# Patient Record
Sex: Male | Born: 1940 | Race: White | Hispanic: No | Marital: Married | Smoking: Never smoker
Health system: Southern US, Community
[De-identification: ages and names within clinical notes are randomized; demographics above are authoritative.]

## PROBLEM LIST (undated history)

## (undated) DIAGNOSIS — E7800 Pure hypercholesterolemia, unspecified: Secondary | ICD-10-CM

## (undated) DIAGNOSIS — I2510 Atherosclerotic heart disease of native coronary artery without angina pectoris: Secondary | ICD-10-CM

## (undated) DIAGNOSIS — I714 Abdominal aortic aneurysm, without rupture, unspecified: Secondary | ICD-10-CM

## (undated) DIAGNOSIS — I10 Essential (primary) hypertension: Secondary | ICD-10-CM

## (undated) DIAGNOSIS — M549 Dorsalgia, unspecified: Secondary | ICD-10-CM

## (undated) DIAGNOSIS — I499 Cardiac arrhythmia, unspecified: Secondary | ICD-10-CM

## (undated) DIAGNOSIS — M25512 Pain in left shoulder: Secondary | ICD-10-CM

## (undated) DIAGNOSIS — G8929 Other chronic pain: Secondary | ICD-10-CM

## (undated) DIAGNOSIS — I4891 Unspecified atrial fibrillation: Secondary | ICD-10-CM

## (undated) DIAGNOSIS — E119 Type 2 diabetes mellitus without complications: Secondary | ICD-10-CM

## (undated) DIAGNOSIS — G4730 Sleep apnea, unspecified: Secondary | ICD-10-CM

## (undated) HISTORY — DX: Sleep apnea, unspecified: G47.30

## (undated) HISTORY — DX: Unspecified atrial fibrillation: I48.91

## (undated) HISTORY — DX: Abdominal aortic aneurysm, without rupture: I71.4

## (undated) HISTORY — DX: Pain in left shoulder: M25.512

## (undated) HISTORY — DX: Type 2 diabetes mellitus without complications: E11.9

## (undated) HISTORY — DX: Atherosclerotic heart disease of native coronary artery without angina pectoris: I25.10

## (undated) HISTORY — DX: Abdominal aortic aneurysm, without rupture, unspecified: I71.40

---

## 1981-08-23 HISTORY — PX: CATARACT EXTRACTION: SUR2

## 1981-08-23 HISTORY — PX: EYE SURGERY: SHX253

## 1994-08-23 HISTORY — PX: CORONARY STENT PLACEMENT: SHX1402

## 1994-08-23 HISTORY — PX: CARDIAC CATHETERIZATION: SHX172

## 1995-08-18 HISTORY — PX: CORONARY ANGIOPLASTY WITH STENT PLACEMENT: SHX49

## 1996-06-18 HISTORY — PX: CORONARY ANGIOPLASTY WITH STENT PLACEMENT: SHX49

## 1996-06-18 HISTORY — PX: CARDIAC CATHETERIZATION: SHX172

## 1998-08-23 HISTORY — PX: HERNIA REPAIR: SHX51

## 2000-05-04 ENCOUNTER — Ambulatory Visit (HOSPITAL_COMMUNITY): Admission: RE | Admit: 2000-05-04 | Discharge: 2000-05-04 | Payer: Self-pay | Admitting: Surgery

## 2001-03-15 ENCOUNTER — Encounter: Payer: Self-pay | Admitting: *Deleted

## 2001-03-15 ENCOUNTER — Ambulatory Visit (HOSPITAL_COMMUNITY): Admission: RE | Admit: 2001-03-15 | Discharge: 2001-03-15 | Payer: Self-pay | Admitting: *Deleted

## 2002-05-10 ENCOUNTER — Ambulatory Visit (HOSPITAL_COMMUNITY): Admission: RE | Admit: 2002-05-10 | Discharge: 2002-05-10 | Payer: Self-pay | Admitting: Family Medicine

## 2002-05-10 ENCOUNTER — Encounter: Payer: Self-pay | Admitting: Family Medicine

## 2002-05-15 ENCOUNTER — Ambulatory Visit (HOSPITAL_COMMUNITY): Admission: RE | Admit: 2002-05-15 | Discharge: 2002-05-15 | Payer: Self-pay | Admitting: Family Medicine

## 2002-05-15 ENCOUNTER — Encounter: Payer: Self-pay | Admitting: Family Medicine

## 2002-05-28 ENCOUNTER — Ambulatory Visit (HOSPITAL_COMMUNITY): Admission: RE | Admit: 2002-05-28 | Discharge: 2002-05-28 | Payer: Self-pay | Admitting: Family Medicine

## 2002-05-28 ENCOUNTER — Encounter: Payer: Self-pay | Admitting: Family Medicine

## 2004-06-04 ENCOUNTER — Ambulatory Visit (HOSPITAL_COMMUNITY): Admission: RE | Admit: 2004-06-04 | Discharge: 2004-06-04 | Payer: Self-pay | Admitting: Family Medicine

## 2004-07-10 ENCOUNTER — Ambulatory Visit: Payer: Self-pay | Admitting: Internal Medicine

## 2004-07-10 ENCOUNTER — Ambulatory Visit (HOSPITAL_COMMUNITY): Admission: RE | Admit: 2004-07-10 | Discharge: 2004-07-10 | Payer: Self-pay | Admitting: Internal Medicine

## 2006-08-23 HISTORY — PX: PACEMAKER INSERTION: SHX728

## 2007-04-21 HISTORY — PX: DOPPLER ECHOCARDIOGRAPHY: SHX263

## 2007-04-24 DIAGNOSIS — G4730 Sleep apnea, unspecified: Secondary | ICD-10-CM

## 2007-04-24 HISTORY — DX: Sleep apnea, unspecified: G47.30

## 2007-04-24 HISTORY — PX: INSERT / REPLACE / REMOVE PACEMAKER: SUR710

## 2009-05-07 ENCOUNTER — Emergency Department (HOSPITAL_COMMUNITY): Admission: EM | Admit: 2009-05-07 | Discharge: 2009-05-07 | Payer: Self-pay | Admitting: Emergency Medicine

## 2009-05-20 ENCOUNTER — Ambulatory Visit (HOSPITAL_COMMUNITY): Admission: RE | Admit: 2009-05-20 | Discharge: 2009-05-20 | Payer: Self-pay | Admitting: Family Medicine

## 2009-07-11 ENCOUNTER — Ambulatory Visit: Payer: Self-pay | Admitting: Vascular Surgery

## 2009-11-24 ENCOUNTER — Ambulatory Visit (HOSPITAL_COMMUNITY): Admission: RE | Admit: 2009-11-24 | Discharge: 2009-11-24 | Payer: Self-pay | Admitting: Internal Medicine

## 2010-04-17 HISTORY — PX: NM MYOCAR MULTIPLE W/SPECT: HXRAD626

## 2010-04-17 HISTORY — PX: DOPPLER ECHOCARDIOGRAPHY: SHX263

## 2010-09-04 ENCOUNTER — Ambulatory Visit
Admission: RE | Admit: 2010-09-04 | Discharge: 2010-09-04 | Payer: Self-pay | Source: Home / Self Care | Attending: Vascular Surgery | Admitting: Vascular Surgery

## 2010-11-27 LAB — BASIC METABOLIC PANEL
Calcium: 8.9 mg/dL (ref 8.4–10.5)
Creatinine, Ser: 0.72 mg/dL (ref 0.4–1.5)
GFR calc Af Amer: >60 mL/min (ref >60)
GFR calc non Af Amer: >60 mL/min (ref >60)

## 2010-11-27 LAB — DIFFERENTIAL
Lymphs Abs: 1.3 10*3/uL (ref 0.7–4.0)
Monocytes Relative: 8 % (ref 3–12)
Neutro Abs: 4.2 10*3/uL (ref 1.7–7.7)
Neutrophils Relative %: 69 % (ref 43–77)

## 2010-11-27 LAB — URINALYSIS, ROUTINE W REFLEX MICROSCOPIC
Ketones, ur: NEGATIVE mg/dL
Nitrite: NEGATIVE
Specific Gravity, Urine: 1.01 (ref 1.005–1.030)
Urobilinogen, UA: 0.2 mg/dL (ref 0.0–1.0)
pH: 8 (ref 5.0–8.0)

## 2010-11-27 LAB — PROTIME-INR
INR: 2 — ABNORMAL HIGH (ref 0.00–1.49)
Prothrombin Time: 22.7 seconds — ABNORMAL HIGH (ref 11.6–15.2)

## 2010-11-27 LAB — CBC
Platelets: 134 10*3/uL — ABNORMAL LOW (ref 150–400)
RBC: 4.42 MIL/uL (ref 4.22–5.81)
WBC: 6.1 10*3/uL (ref 4.0–10.5)

## 2010-11-30 ENCOUNTER — Other Ambulatory Visit (INDEPENDENT_AMBULATORY_CARE_PROVIDER_SITE_OTHER): Payer: Self-pay | Admitting: Internal Medicine

## 2010-11-30 DIAGNOSIS — K769 Liver disease, unspecified: Secondary | ICD-10-CM

## 2010-12-07 ENCOUNTER — Encounter (HOSPITAL_COMMUNITY): Payer: Self-pay

## 2010-12-07 ENCOUNTER — Ambulatory Visit (HOSPITAL_COMMUNITY)
Admission: RE | Admit: 2010-12-07 | Discharge: 2010-12-07 | Disposition: A | Discharge status: Home / Self Care | Payer: Medicare Other | Source: Ambulatory Visit | Attending: Internal Medicine | Admitting: Internal Medicine

## 2010-12-07 ENCOUNTER — Other Ambulatory Visit (INDEPENDENT_AMBULATORY_CARE_PROVIDER_SITE_OTHER): Payer: Self-pay | Admitting: Internal Medicine

## 2010-12-07 DIAGNOSIS — K769 Liver disease, unspecified: Secondary | ICD-10-CM

## 2010-12-07 DIAGNOSIS — K7689 Other specified diseases of liver: Secondary | ICD-10-CM | POA: Insufficient documentation

## 2010-12-07 HISTORY — DX: Essential (primary) hypertension: I10

## 2010-12-07 MED ORDER — IOHEXOL 300 MG/ML  SOLN
100.0000 mL | Freq: Once | INTRAMUSCULAR | Status: AC | PRN
  Administered 2010-12-07: 100 mL via INTRAVENOUS

## 2011-01-05 NOTE — Procedures (Signed)
DUPLEX ULTRASOUND OF ABDOMINAL AORTA   INDICATION:  AAA.   HISTORY:  Diabetes:  No.  Cardiac:  Atrial fibrillation, cardiac stent, pacemaker.  Hypertension:  Yes.  Smoking:  No.  Connective Tissue Disorder:  Family History:  No.  Previous Surgery:  No.  Hyperlipidemia:  Yes.   DUPLEX EXAM:         AP (cm)                   TRANSVERSE (cm)  Proximal             2.83 cm                   2.70 cm  Mid                  2.83 cm                   2.65 cm  Distal               Not visualized            Not visualized  Right Iliac          1.11 cm                   1.21 cm  Left Iliac           1.37 cm                   1.31 cm   PREVIOUS:  Date:  AP:  3.2  TRANSVERSE:   IMPRESSION:  1. 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  2. Distal aorta difficult to image due to overlying bowel gas.   ___________________________________________  Quita Skye. Hart Rochester, M.D.   EM/MEDQ  D:  09/04/2010  T:  09/04/2010  Job:  [id number]

## 2011-01-05 NOTE — Consult Note (Signed)
NEW PATIENT CONSULTATION   Johnny Reynolds, Johnny Reynolds  DOB:  06-28-1941                                       07/11/2009  YIRSW#:[id number]   The patient presents today for evaluation of infrarenal abdominal aortic  aneurysm.  He is a very pleasant 70 year old gentleman who in September  had an episode of abdominal discomfort.  He, at the same setting, had  hypotension, checked his blood pressure and it was in the 80s with  several repeats.  He reports that he had a feeling of needing to have a  bowel movement, at the same time had a feeling of having to vomit.  He  did not vomit.  The nausea persisted and he presented to the emergency  room for further evaluation.  He did have some left lower quadrant  discomfort at the same setting but all of his abdominal symptoms were  relatively mild.  He reports these resolved after several hours and has  not had any recurrent episode of this type of pain.  He had had no prior  events like this and has had none since.   REVIEW OF SYSTEMS:  Is noted for atrial fibrillation.  He does not have  any pulmonary symptoms.  He did have a colonoscopy with some possible  diverticulosis.  He has no GU, vascular, neurologic, musculoskeletal,  psychiatric difficulty.  He does have a history of prior coronary  angioplasty and a pacemaker placed in 2008.  His review of systems  otherwise is negative.   SOCIAL HISTORY:  He is married with 2 children.  He is a retired  Art gallery manager.  He does not smoke or drink alcohol.   FAMILY HISTORY:  He does have a history of premature atherosclerotic  disease in his mother, sister and his brother.   PHYSICAL EXAM:  General:  A well-developed, well-nourished white male  appearing stated age of 31.  He is in no acute distress.  HEENT:  His  pupils are equal, round and reactive to light.  Conjunctivae are not  icteric.  Neck:  Shows no JVD, no cervical adenopathy and no bruits are  present.  Lungs:  Are clear  bilaterally with no rales, rhonchi or  wheezes.  Cardiovascular:  Regular rate and rhythm with no murmurs.  Abdomen:  Abdominal exam is soft and nontender.  I do not feel any  masses.  I do not feel an aneurysm.  Musculoskeletal:  No deformities.  No cyanosis or edema.  Neurological:  No focal deficits or paresthesias.  Skin:  Without rashes or ulcers.   I have reviewed the patient's data from his evaluation with Dr. Phillips Odor.  He is to have further evaluation of a liver lesion as well.  His CT scan  at the time of this visit showed a maximal diameter of 3.2 cm infrarenal  abdominal aortic aneurysm.  I discussed this at length with the patient  and his wife present.  I did review his actual films with him as well.  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  I explained the significance of this and this can lead to  increased risk for aneurysm formation.  I explained that with his very  small aneurysm it would be unlikely that he would ever have any  significant difficulty related to this.  I have recommended  that we see  him again in 1 year with repeat ultrasound to rule out any change in his  size of his small aneurysm.  He understands and he does not need to have  any restrictions of his activity.   Larina Earthly, M.D.  Electronically Signed   TFE/MEDQ  D:  07/11/2009  T:  07/11/2009  Job:  [id number]   cc:   Corrie Mckusick, M.D.

## 2011-01-08 NOTE — Op Note (Signed)
[place]  Patient:    Johnny Reynolds, Johnny Reynolds                       MRN: [id number] Proc. Date: 05/04/00 Adm. Date:  [id number] Attending:  Andre Lefort CC:         Dr. Burton Apley, [place]   Operative Report  DATE OF BIRTH:  04/14/1941  PREOPERATIVE DIAGNOSIS:  Medium-sized right inguinal hernia, small left inguinal hernia.  POSTOPERATIVE DIAGNOSIS:  Medium-sized indirect right inguinal hernia with lipoma, small indirect left inguinal hernia with lipoma  OPERATION PERFORMED:  Bilateral laparoscopic inguinal herniorrhaphy with mesh repair.  SURGEON:  Sandria Bales. Ezzard Standing, M.D.  ASSISTANT:  Anselm Pancoast. Zachery Dakins, M.D.  ANESTHESIA:  General endotracheal.  ESTIMATED BLOOD LOSS:  Minimal.  INDICATIONS FOR PROCEDURE:  The patient is a 70 year old white male who has a symptomatic right inguinal hernia.  On my physical exam I think he has the beginnings of a left inguinal hernia.  I talked to him about both laparoscopic and open inguinal hernia repairs.  He wanted to proceed with laparoscopic hernia repair and we will do that at this time.  DESCRIPTION OF PROCEDURE:  The patient was placed in a supine position with both his arms tucked to his side.  He was given a gram of Ancef at the initiation of the procedure.  He had a Foley catheter in place.  His lower abdomen was shaved and prepped with Betadine solution and sterilely draped.  The initial incision was infraumbilical incision down to the rectus abdominis fascia.  I went to the right lateral anterior rectus sheath, divided this, retracted the rectus muscle laterally and then inserted the autosuture or [employer] PBD balloon in the preperitoneal space behind the rectus muscle.  I got reasonably good insufflation with direct vision of the pubic bone, Coopers ligament, inferior epigastric vessels on both sides.  I then removed the balloon and inserted the Hasson trocar and  insufflated the balloon via Hasson with 30 cc of air.  Then under direct visualization, I placed two 5 mm ports at the level of the anterior iliac spines off the midline.  I infiltrated both of these with 0.25% Marcaine.  I then was able to introduce my instruments. I was able to dissect first the right side, identified a reasonably large lipoma with a medium-sized indirect hernia sac which was retracted down to the level of the  anterior iliac spines.  I was able to isolate the cord structures, identify the vas deferens. I then turned to the left side finding a similar lipoma though not quite as large.  I found a more minimal hernia on the left side.  At this point I did a repair using the precut Atrium mesh that I wrapped around the cord on both sides.  I then placed staples to the pubic bone medially to Coopers ligament inferiorly, the transversalis fascia superiorly. I spared any staples lateral to the cord structures and inferior to the iliopubic tract and then I placed identical staples on the opposite side on the right side, again to the pubic bone medially, to the Coopers ligament inferiorly to the transversalis fascia superiorly.  I used 13 staples on the left side, 17 staples on the right side.  At termination, the mesh was lying in good position covering both direct hernias and was well wrapped around the cord without any excess tension.  There was no bleeding.  The trocars  were then removed under direct visualization without bleeding.  I placed a suture in the anterior rectus fascia with 0 Vicryl suture and then closed the skin with a 5-0 Vicryl suture, painted with tincture of benzoin, Steri-Strips and sterilely dressed.  The patient tolerated the procedure well was transported to the recovery room in good condition with plans to be discharged home today and return to see me in seven to 10 days for follow-up and to call for any problems. DD:  05/04/00 TD:  05/05/00 Job:  [id number]

## 2011-01-08 NOTE — Op Note (Signed)
Johnny Reynolds, Johnny Reynolds                ACCOUNT NO.:  [id number]   MEDICAL RECORD NO.:  [id number]          PATIENT TYPE:  AMB   LOCATION:  DAY                           FACILITY:  [facility]   PHYSICIAN:  Lionel December, M.D.    DATE OF BIRTH:  July 20, 1941   DATE OF PROCEDURE:  07/10/2004  DATE OF DISCHARGE:                                 OPERATIVE REPORT   PROCEDURE:  Total colonoscopy with polypectomy.   The patient is a 70 year old Caucasian male who has had a few episodes of  hematochezia without any change in his bowel habits.  Family history is  negative for colorectal carcinoma.  He is undergoing diagnostic colonoscopy.  The procedure risks were reviewed with the patient and informed consent was  obtained.   PREMEDICATION:  Demerol 50 mg IV, Versed 5 mg IV in divided dose.   FINDINGS:  Procedure performed in endoscopy suite.  The patient's vital  signs and O2 saturation were monitored during the procedure and remained  stable.  The patient was placed in the left lateral recumbent position and  rectal examination performed.  No abnormality noted on external or digital  exam.  The Olympus video scope was placed in the rectum and advanced under  vision in sigmoid colon and beyond.  A few tiny diverticula were noted at  sigmoid colon and a few more at the ascending colon.  The scope was passed  in the cecum, which was identified by the appendiceal orifice and the  ileocecal valve.  As the scope was withdrawn, colonic mucosa was carefully  examined.  There was a pedunculated polyp over a centimeter in length with  ulcerated, eroded surface.  This was snared and retrieved for histologic  examination without any difficulty.  The mucosa of the distal sigmoid colon  and rectum was normal.  The scope was retroflexed to examine the anorectal  junction, where small hemorrhoids were noted below the dentate line with  prominent papilla.  The endoscope was straightened and withdrawn.  The  patient  tolerated the procedure well.   FINAL DIAGNOSES:  1.  Over 10 mm pedunculated polyp with eroded surface, snared from sigmoid      colon.  2.  Few tiny diverticula at sigmoid and ascending colon.  3.  Small external hemorrhoids.   RECOMMENDATIONS:  1.  Standard instructions given.  2.  A high-fiber diet plus Citrucel or equivalent one tablespoonful daily.  3.  I will be contacting the patient with biopsy results and further      recommendations.     Naje   NR/MEDQ  D:  07/10/2004  T:  07/10/2004  Job:  [id number]   cc:   Kirk Ruths, M.D.  [address]  [place]  [place] [address]  Fax: [phone]

## 2011-12-05 ENCOUNTER — Encounter (HOSPITAL_COMMUNITY): Payer: Self-pay

## 2011-12-05 ENCOUNTER — Emergency Department (HOSPITAL_COMMUNITY): Payer: Medicare Other

## 2011-12-05 ENCOUNTER — Emergency Department (HOSPITAL_COMMUNITY)
Admission: EM | Admit: 2011-12-05 | Discharge: 2011-12-05 | Disposition: A | Discharge status: Home / Self Care | Payer: Medicare Other | Attending: Emergency Medicine | Admitting: Emergency Medicine

## 2011-12-05 DIAGNOSIS — M5380 Other specified dorsopathies, site unspecified: Secondary | ICD-10-CM | POA: Insufficient documentation

## 2011-12-05 DIAGNOSIS — Z950 Presence of cardiac pacemaker: Secondary | ICD-10-CM | POA: Insufficient documentation

## 2011-12-05 DIAGNOSIS — R0602 Shortness of breath: Secondary | ICD-10-CM | POA: Insufficient documentation

## 2011-12-05 DIAGNOSIS — R079 Chest pain, unspecified: Secondary | ICD-10-CM | POA: Insufficient documentation

## 2011-12-05 DIAGNOSIS — E119 Type 2 diabetes mellitus without complications: Secondary | ICD-10-CM | POA: Insufficient documentation

## 2011-12-05 DIAGNOSIS — I10 Essential (primary) hypertension: Secondary | ICD-10-CM | POA: Insufficient documentation

## 2011-12-05 DIAGNOSIS — M62830 Muscle spasm of back: Secondary | ICD-10-CM

## 2011-12-05 HISTORY — DX: Other chronic pain: G89.29

## 2011-12-05 HISTORY — DX: Pure hypercholesterolemia, unspecified: E78.00

## 2011-12-05 HISTORY — DX: Dorsalgia, unspecified: M54.9

## 2011-12-05 HISTORY — DX: Cardiac arrhythmia, unspecified: I49.9

## 2011-12-05 LAB — URINALYSIS, ROUTINE W REFLEX MICROSCOPIC
Hgb urine dipstick: NEGATIVE
Leukocytes, UA: NEGATIVE
Nitrite: NEGATIVE
Protein, ur: NEGATIVE mg/dL
Specific Gravity, Urine: 1.01 (ref 1.005–1.030)
Urobilinogen, UA: 0.2 mg/dL (ref 0.0–1.0)

## 2011-12-05 LAB — BASIC METABOLIC PANEL
BUN: 13 mg/dL (ref 6–23)
Chloride: 101 mEq/L (ref 96–112)
GFR calc Af Amer: >90 mL/min (ref >90)
GFR calc non Af Amer: >90 mL/min (ref >90)
Potassium: 3.1 mEq/L — ABNORMAL LOW (ref 3.5–5.1)
Sodium: 138 mEq/L (ref 135–145)

## 2011-12-05 LAB — D-DIMER, QUANTITATIVE: D-Dimer, Quant: <0.22 ug/mL-FEU (ref 0.00–0.48)

## 2011-12-05 LAB — DIFFERENTIAL
Basophils Relative: 0 % (ref 0–1)
Eosinophils Absolute: 0.2 10*3/uL (ref 0.0–0.7)
Monocytes Relative: 10 % (ref 3–12)
Neutro Abs: 2.9 10*3/uL (ref 1.7–7.7)
Neutrophils Relative %: 58 % (ref 43–77)

## 2011-12-05 LAB — TROPONIN I: Troponin I: <0.3 ng/mL (ref <0.30)

## 2011-12-05 LAB — CBC
Hemoglobin: 14 g/dL (ref 13.0–17.0)
MCH: 29.5 pg (ref 26.0–34.0)
MCHC: 34.2 g/dL (ref 30.0–36.0)
Platelets: 159 10*3/uL (ref 150–400)
RBC: 4.74 MIL/uL (ref 4.22–5.81)

## 2011-12-05 MED ORDER — HYDROCHLOROTHIAZIDE 25 MG PO TABS
25.0000 mg | ORAL_TABLET | Freq: Once | ORAL | Status: AC
  Administered 2011-12-05: 25 mg via ORAL
  Filled 2011-12-05: qty 1

## 2011-12-05 MED ORDER — HYDROCODONE-ACETAMINOPHEN 5-325 MG PO TABS: 2.0000 | ORAL_TABLET | ORAL | Status: AC | PRN

## 2011-12-05 MED ORDER — METHYLPREDNISOLONE 4 MG PO KIT: PACK | ORAL | Status: DC

## 2011-12-05 MED ORDER — LABETALOL HCL 5 MG/ML IV SOLN
20.0000 mg | Freq: Once | INTRAVENOUS | Status: AC
Start: 2011-12-05 — End: 2011-12-05
  Administered 2011-12-05: 20 mg via INTRAVENOUS
  Filled 2011-12-05: qty 4

## 2011-12-05 MED ORDER — HYDROCHLOROTHIAZIDE 25 MG PO TABS: 25.0000 mg | ORAL_TABLET | Freq: Every day | ORAL | Status: DC

## 2011-12-05 MED ORDER — IOHEXOL 350 MG/ML SOLN
100.0000 mL | Freq: Once | INTRAVENOUS | Status: AC | PRN
  Administered 2011-12-05: 100 mL via INTRAVENOUS

## 2011-12-05 MED ORDER — METHYLPREDNISOLONE 4 MG PO KIT: PACK | ORAL | Status: AC

## 2011-12-05 MED ORDER — HYDROCODONE-ACETAMINOPHEN 5-325 MG PO TABS
2.0000 | ORAL_TABLET | Freq: Once | ORAL | Status: AC
  Administered 2011-12-05: 2 via ORAL
  Filled 2011-12-05: qty 2

## 2011-12-05 NOTE — ED Notes (Signed)
Pt states he goes to a chiropractor for back problems. States he blood pressure was up. States it is still elevated

## 2011-12-05 NOTE — ED Provider Notes (Signed)
History   This chart was scribed for Glynn Octave, MD scribed by Magnus Sinning. The patient was seen in room APA11/APA11 seen at 0928.   CSN: [id number]  Arrival date & time 12/05/11  0865   First MD Initiated Contact with Patient 12/05/11 [phone]      Chief Complaint  Patient presents with  . Hypertension    (Consider location/radiation/quality/duration/timing/severity/associated sxs/prior treatment) HPI Johnny Reynolds is a 70 y.o. male who presents to the Emergency Department complaining of constant elevated BP fluctuating between "200 and 210 on average," onset 4 days ago. Says he went to chiropractor on Thursday and was informed of high BP. Since he reports he has been checking it several times and that it has remained elevated over the last few days. At baseline, pt says he is within normal 120/80 BP. Explains he has taken is medication regimen normally and has not had any changes to his medications recently. Adds he is also experiencing associated non-radiating back pain, onset 2 weeks. Pt describes that it is located in middle of back and that he has had sleep difficulties due to pain. Says he's done a lot of shoveling last month, and believes pain started following. Denies CP, leg pain, HA, blurry/double vision, or abd pain. Pt has a pacemaker (placed in 08) for irregular breathing and arrythmia. Hx of angioplasty and DM. Pt currently 6 mg Coumadin, 240 mg Diltiazem, 500 mg Metformin, two 800 mg asa, and 40 mg Simivastin.   Cardologist: [facility]. Dr. Gery Pray.   Past Medical History  Diagnosis Date  . Hypertension   . Diabetes mellitus   . Back pain, chronic   . Arrhythmia   . High cholesterol     Past Surgical History  Procedure Date  . Pacemaker insertion   . Coronary stent placement     No family history on file.  History  Substance Use Topics  . Smoking status: Not on file  . Smokeless tobacco: Not on file  . Alcohol Use: No    Review of  Systems  All other systems reviewed and are negative.   10 Systems reviewed and are negative for acute change except as noted in the HPI. Allergies  Allegra  Home Medications   Current Outpatient Rx  Name Route Sig Dispense Refill  . HYDROCHLOROTHIAZIDE 25 MG PO TABS Oral Take 1 tablet (25 mg total) by mouth daily. 10 tablet 0  . HYDROCODONE-ACETAMINOPHEN 5-325 MG PO TABS Oral Take 2 tablets by mouth every 4 (four) hours as needed for pain. 10 tablet 0  . METHYLPREDNISOLONE 4 MG PO KIT  follow package directions 21 tablet 0    BP 204/113  Temp(Src) 98.4 F (36.9 C) (Oral)  Resp 20  Ht 5\' 6"  (1.676 m)  Wt 161 lb (73.029 kg)  BMI 25.99 kg/m2  SpO2 99%  Physical Exam  Nursing note and vitals reviewed. Constitutional: He is oriented to person, place, and time. He appears well-developed and well-nourished. No distress.  HENT:  Head: Normocephalic and atraumatic.  Eyes: EOM are normal. Pupils are equal, round, and reactive to light.  Neck: Neck supple. No tracheal deviation present.  Cardiovascular: Normal rate.   Pulmonary/Chest: Effort normal. No respiratory distress.  Abdominal: Soft. He exhibits no distension.  Musculoskeletal: Normal range of motion. He exhibits no edema and no tenderness.       Left mid thoracic pain to palpation No midline tenderness.   Neurological: He is alert and oriented to person, place, and  time. No sensory deficit.       Equal strength bilaterally  5/5 strength in upper extremities   Skin: Skin is warm and dry.  Psychiatric: He has a normal mood and affect. His behavior is normal.    ED Course  Procedures (including critical care time) DIAGNOSTIC STUDIES: Oxygen Saturation is 99% on room air, normal by my interpretation.    COORDINATION OF CARE: Medication Orders 0945: NORMODYNE,TRANDATE injection 20 mg Once    1155: EDMD performs recheck and updates patient on lab results. Patient reports pain has improved. Recommends follow-up with  Dr. Phillips Odor tomorrow morning. Patient states he's already made appt for tomorrow. 1236: EDMD recommends CT scan for precautionary measure. Patient agrees with plan of action set at this time.  Labs Reviewed  BASIC METABOLIC PANEL - Abnormal; Notable for the following:    Potassium 3.1 (*)    Glucose, Bld 129 (*)    All other components within normal limits  CBC  DIFFERENTIAL  TROPONIN I  URINALYSIS, ROUTINE W REFLEX MICROSCOPIC  D-DIMER, QUANTITATIVE   Dg Chest 2 View  12/05/2011  *RADIOLOGY REPORT*  Clinical Data: Left thoracic pain.  Hypertension.  CHEST - 2 VIEW  Comparison: 06/04/2004  Findings: The cardiomediastinal silhouette is unremarkable. A left dual lead pacemaker is identified with leads overlying the right atrium and right ventricle. There is no evidence of focal airspace disease, pulmonary edema, suspicious pulmonary nodule/mass, pleural effusion, or pneumothorax. No acute bony abnormalities are identified.  IMPRESSION: No evidence of acute cardiopulmonary disease.  Original Report Authenticated By: Rosendo Gros, M.D.     1. Hypertension   2. Back spasm      MDM  2 weeks of left-sided thoracic mid back pain that started after shoveling. No weakness, numbness, tingling, bowel or bladder incontinence, fever or vomiting. History of hypertension and pacemaker states compliance with medications. No chest pain, shortness of breath, headache, visual change. Elevation of blood pressure greater than 200 for at least 4 days.  No evidence of end organ damage. Troponin negative, creatinine normal. D-dimer negative. Musculoskeletal thoracic back pain reproducible to palpation, no neurological deficits  Patient has appointment tomorrow with Dr. Phillips Odor advised to keep this for evaluation of his blood pressure. Start hydrochlorothiazide.  Pain meds and steroid burst for MSK back pain.   Date: 12/05/2011  Rate: 97  Rhythm: paced  QRS Axis: normal  Intervals: QT prolonged  ST/T  Wave abnormalities: nonspecific ST/T changes  Conduction Disutrbances:none  Narrative Interpretation: slight ST depression V5, V6  Old EKG Reviewed: changes noted    I personally performed the services described in this documentation, which was scribed in my presence.  The recorded information has been reviewed and considered.      Glynn Octave, MD 12/05/11 [phone]

## 2011-12-05 NOTE — Discharge Instructions (Signed)
Arterial Hypertension Arterial hypertension (high blood pressure) is a condition of elevated pressure in your blood vessels. Hypertension over a long period of time is a risk factor for strokes, heart attacks, and heart failure. It is also the leading cause of kidney (renal) failure.  CAUSES   In Adults -- Over 90% of all hypertension has no known cause. This is called essential or primary hypertension. In the other 10% of people with hypertension, the increase in blood pressure is caused by another disorder. This is called secondary hypertension. Important causes of secondary hypertension are:   Heavy alcohol use.   Obstructive sleep apnea.   Hyperaldosterosim (Conn's syndrome).   Steroid use.   Chronic kidney failure.   Hyperparathyroidism.   Medications.   Renal artery stenosis.   Pheochromocytoma.   Cushing's disease.   Coarctation of the aorta.   Scleroderma renal crisis.   Licorice (in excessive amounts).   Drugs (cocaine, methamphetamine).  Your caregiver can explain any items above that apply to you.  In Children -- Secondary hypertension is more common and should always be considered.   Pregnancy -- Few women of childbearing age have high blood pressure. However, up to 10% of them develop hypertension of pregnancy. Generally, this will not harm the woman. It may be a sign of 3 complications of pregnancy: preeclampsia, HELLP syndrome, and eclampsia. Follow up and control with medication is necessary.  SYMPTOMS   This condition normally does not produce any noticeable symptoms. It is usually found during a routine exam.   Malignant hypertension is a late problem of high blood pressure. It may have the following symptoms:   Headaches.   Blurred vision.   End-organ damage (this means your kidneys, heart, lungs, and other organs are being damaged).   Stressful situations can increase the blood pressure. If a person with normal blood pressure has their blood  pressure go up while being seen by their caregiver, this is often termed "white coat hypertension." Its importance is not known. It may be related with eventually developing hypertension or complications of hypertension.   Hypertension is often confused with mental tension, stress, and anxiety.  DIAGNOSIS  The diagnosis is made by 3 separate blood pressure measurements. They are taken at least 1 week apart from each other. If there is organ damage from hypertension, the diagnosis may be made without repeat measurements. Hypertension is usually identified by having blood pressure readings:  Above 140/90 mmHg measured in both arms, at 3 separate times, over a couple weeks.   Over 130/80 mmHg should be considered a risk factor and may require treatment in patients with diabetes.  Blood pressure readings over 120/80 mmHg are called "pre-hypertension" even in non-diabetic patients. To get a true blood pressure measurement, use the following guidelines. Be aware of the factors that can alter blood pressure readings.  Take measurements at least 1 hour after caffeine.   Take measurements 30 minutes after smoking and without any stress. This is another reason to quit smoking - it raises your blood pressure.   Use a proper cuff size. Ask your caregiver if you are not sure about your cuff size.   Most home blood pressure cuffs are automatic. They will measure systolic and diastolic pressures. The systolic pressure is the pressure reading at the start of sounds. Diastolic pressure is the pressure at which the sounds disappear. If you are elderly, measure pressures in multiple postures. Try sitting, lying or standing.   Sit at rest for a minimum of  5 minutes before taking measurements.   You should not be on any medications like decongestants. These are found in many cold medications.   Record your blood pressure readings and review them with your caregiver.  If you have hypertension:  Your caregiver  may do tests to be sure you do not have secondary hypertension (see "causes" above).   Your caregiver may also look for signs of metabolic syndrome. This is also called Syndrome X or Insulin Resistance Syndrome. You may have this syndrome if you have type 2 diabetes, abdominal obesity, and abnormal blood lipids in addition to hypertension.   Your caregiver will take your medical and family history and perform a physical exam.   Diagnostic tests may include blood tests (for glucose, cholesterol, potassium, and kidney function), a urinalysis, or an EKG. Other tests may also be necessary depending on your condition.  PREVENTION  There are important lifestyle issues that you can adopt to reduce your chance of developing hypertension:  Maintain a normal weight.   Limit the amount of salt (sodium) in your diet.   Exercise often.   Limit alcohol intake.   Get enough potassium in your diet. Discuss specific advice with your caregiver.   Follow a DASH diet (dietary approaches to stop hypertension). This diet is rich in fruits, vegetables, and low-fat dairy products, and avoids certain fats.  PROGNOSIS  Essential hypertension cannot be cured. Lifestyle changes and medical treatment can lower blood pressure and reduce complications. The prognosis of secondary hypertension depends on the underlying cause. Many people whose hypertension is controlled with medicine or lifestyle changes can live a normal, healthy life.  RISKS AND COMPLICATIONS  While high blood pressure alone is not an illness, it often requires treatment due to its short- and long-term effects on many organs. Hypertension increases your risk for:  CVAs or strokes (cerebrovascular accident).   Heart failure due to chronically high blood pressure (hypertensive cardiomyopathy).   Heart attack (myocardial infarction).   Damage to the retina (hypertensive retinopathy).   Kidney failure (hypertensive nephropathy).  Your caregiver can  explain list items above that apply to you. Treatment of hypertension can significantly reduce the risk of complications. TREATMENT   For overweight patients, weight loss and regular exercise are recommended. Physical fitness lowers blood pressure.   Mild hypertension is usually treated with diet and exercise. A diet rich in fruits and vegetables, fat-free dairy products, and foods low in fat and salt (sodium) can help lower blood pressure. Decreasing salt intake decreases blood pressure in a 1/3 of people.   Stop smoking if you are a smoker.  The steps above are highly effective in reducing blood pressure. While these actions are easy to suggest, they are difficult to achieve. Most patients with moderate or severe hypertension end up requiring medications to bring their blood pressure down to a normal level. There are several classes of medications for treatment. Blood pressure pills (antihypertensives) will lower blood pressure by their different actions. Lowering the blood pressure by 10 mmHg may decrease the risk of complications by as much as 25%. The goal of treatment is effective blood pressure control. This will reduce your risk for complications. Your caregiver will help you determine the best treatment for you according to your lifestyle. What is excellent treatment for one person, may not be for you. HOME CARE INSTRUCTIONS   Do not smoke.   Follow the lifestyle changes outlined in the "Prevention" section.   If you are on medications, follow the directions  carefully. Blood pressure medications must be taken as prescribed. Skipping doses reduces their benefit. It also puts you at risk for problems.   Follow up with your caregiver, as directed.   If you are asked to monitor your blood pressure at home, follow the guidelines in the "Diagnosis" section above.  SEEK MEDICAL CARE IF:   You think you are having medication side effects.   You have recurrent headaches or lightheadedness.     You have swelling in your ankles.   You have trouble with your vision.  SEEK IMMEDIATE MEDICAL CARE IF:   You have sudden onset of chest pain or pressure, difficulty breathing, or other symptoms of a heart attack.   You have a severe headache.   You have symptoms of a stroke (such as sudden weakness, difficulty speaking, difficulty walking).  MAKE SURE YOU:   Understand these instructions.   Will watch your condition.   Will get help right away if you are not doing well or get worse.  Document Released: 08/09/2005 Document Revised: 07/29/2011 Document Reviewed: 03/09/2007 [facility] Patient Information 2012 [place], [place].

## 2012-08-21 ENCOUNTER — Encounter: Payer: Self-pay | Admitting: Neurosurgery

## 2012-08-23 DIAGNOSIS — M25512 Pain in left shoulder: Secondary | ICD-10-CM

## 2012-08-23 HISTORY — DX: Pain in left shoulder: M25.512

## 2012-09-25 ENCOUNTER — Other Ambulatory Visit: Payer: Self-pay | Admitting: *Deleted

## 2012-09-25 DIAGNOSIS — I714 Abdominal aortic aneurysm, without rupture: Secondary | ICD-10-CM

## 2012-09-26 ENCOUNTER — Ambulatory Visit: Payer: Medicare Other | Admitting: Neurosurgery

## 2012-10-06 ENCOUNTER — Encounter: Payer: Self-pay | Admitting: Neurosurgery

## 2012-10-09 ENCOUNTER — Encounter: Payer: Self-pay | Admitting: Neurosurgery

## 2012-10-09 ENCOUNTER — Other Ambulatory Visit: Payer: Self-pay | Admitting: *Deleted

## 2012-10-09 ENCOUNTER — Ambulatory Visit (INDEPENDENT_AMBULATORY_CARE_PROVIDER_SITE_OTHER): Payer: Medicare Other | Admitting: Vascular Surgery

## 2012-10-09 ENCOUNTER — Ambulatory Visit (INDEPENDENT_AMBULATORY_CARE_PROVIDER_SITE_OTHER): Payer: Medicare Other | Admitting: Neurosurgery

## 2012-10-09 VITALS — BP 174/97 | HR 79 | Resp 16 | Ht 65.0 in | Wt 163.0 lb

## 2012-10-09 DIAGNOSIS — I714 Abdominal aortic aneurysm, without rupture, unspecified: Secondary | ICD-10-CM | POA: Insufficient documentation

## 2012-10-09 NOTE — Progress Notes (Signed)
[facility] [place] AAA/Carotid Office Note  CC: Carotid surveillance Referring Physician: Early  History of Present Illness: 72 year old male patient of [name] followed for known AAA. The patient denies any unusual abdominal or back pain. The patient denies any new medical diagnoses or recent surgery.  Past Medical History  Diagnosis Date  . Hypertension   . Diabetes mellitus   . Back pain, chronic   . Arrhythmia   . High cholesterol   . AAA (abdominal aortic aneurysm)   . Atrial fibrillation     ROS: [x]  Positive   [ ]  Denies    General: [ ]  Weight loss, [ ]  Fever, [ ]  chills Neurologic: [ ]  Dizziness, [ ]  Blackouts, [ ]  Seizure [ ]  Stroke, [ ]  "Mini stroke", [ ]  Slurred speech, [ ]  Temporary blindness; [ ]  weakness in arms or legs, [ ]  Hoarseness Cardiac: [ ]  Chest pain/pressure, [ ]  Shortness of breath at rest [ ]  Shortness of breath with exertion, [ ]  Atrial fibrillation or irregular heartbeat Vascular: [ ]  Pain in legs with walking, [ ]  Pain in legs at rest, [ ]  Pain in legs at night,  [ ]  Non-healing ulcer, [ ]  Blood clot in vein/DVT,   Pulmonary: [ ]  Home oxygen, [ ]  Productive cough, [ ]  Coughing up blood, [ ]  Asthma,  [ ]  Wheezing Musculoskeletal:  [ ]  Arthritis, [ ]  Low back pain, [ ]  Joint pain Hematologic: [ ]  Easy Bruising, [ ]  Anemia; [ ]  Hepatitis Gastrointestinal: [ ]  Blood in stool, [ ]  Gastroesophageal Reflux/heartburn, [ ]  Trouble swallowing Urinary: [ ]  chronic Kidney disease, [ ]  on HD - [ ]  MWF or [ ]  TTHS, [ ]  Burning with urination, [ ]  Difficulty urinating Skin: [ ]  Rashes, [ ]  Wounds Psychological: [ ]  Anxiety, [ ]  Depression   Social History History  Substance Use Topics  . Smoking status: Not on file  . Smokeless tobacco: Never Used  . Alcohol Use: No    Family History Family History  Problem Relation Age of Onset  . Peripheral vascular disease Mother   . Hypertension Mother   . Diabetes Mother   . Peripheral  vascular disease Sister   . Peripheral vascular disease Brother   . Stroke Father 12    TIA    Allergies  Allergen Reactions  . Fexofenadine Hcl Palpitations    Allegra    Current Outpatient Prescriptions  Medication Sig Dispense Refill  . aspirin 81 MG tablet Take 81 mg by mouth daily.      Marland Kitchen atorvastatin (LIPITOR) 40 MG tablet       . diltiazem (CARDIZEM CD) 240 MG 24 hr capsule       . levothyroxine (SYNTHROID, LEVOTHROID) 50 MCG tablet       . losartan-hydrochlorothiazide (HYZAAR) 100-12.5 MG per tablet       . metFORMIN (GLUCOPHAGE) 500 MG tablet       . ONE TOUCH TEST STRIPS test strip       . sotalol (BETAPACE) 160 MG tablet       . warfarin (COUMADIN) 5 MG tablet       . hydrochlorothiazide (HYDRODIURIL) 25 MG tablet Take 1 tablet (25 mg total) by mouth daily.  10 tablet  0   No current facility-administered medications for this visit.    Physical Examination  Filed Vitals:   10/09/12 0956  BP: 174/97  Pulse: 79  Resp: 16    Body mass index is 27.12  kg/(m^2).  General:  WDWN in NAD Gait: Normal HEENT: WNL Eyes: Pupils equal Pulmonary: normal non-labored breathing , without Rales, rhonchi,  wheezing Cardiac: RRR, without  Murmurs, rubs or gallops; Abdomen: soft, NT, no masses Skin: no rashes, ulcers noted  Vascular Exam Pulses: 3+ radial pulses bilaterally, there are no abdominal pulsatile mass palpated Carotid bruits: Carotid pulses to auscultation no bruits are heard Extremities without ischemic changes, no Gangrene , no cellulitis; no open wounds;  Musculoskeletal: no muscle wasting or atrophy   Neurologic: A&O X 3; Appropriate Affect ; SENSATION: normal; MOTOR FUNCTION:  moving all extremities equally. Speech is fluent/normal  Non-Invasive Vascular Imaging AAA duplex shows a maximum diameter of 3.05 AP by 3.1 transverse which is a slight increase from January 2012 when he was 2.8 x 2.7  ASSESSMENT/PLAN: Asymptomatic patient will followup in one  year with repeat AAA duplex. The patient's questions were encouraged and answered, he is in agreement with this plan.  Lauree Chandler ANP   Clinic MD: Myra Gianotti

## 2012-10-09 NOTE — Progress Notes (Signed)
Abdominal aortic aneurysm duplex performed @ [facility] 10/09/2012

## 2012-12-11 ENCOUNTER — Encounter: Payer: Self-pay | Admitting: *Deleted

## 2013-02-17 ENCOUNTER — Other Ambulatory Visit: Payer: Self-pay | Admitting: Cardiovascular Disease

## 2013-02-17 DIAGNOSIS — I498 Other specified cardiac arrhythmias: Secondary | ICD-10-CM

## 2013-02-17 LAB — PACEMAKER DEVICE OBSERVATION

## 2013-02-20 ENCOUNTER — Encounter: Payer: Self-pay | Admitting: *Deleted

## 2013-02-20 LAB — REMOTE PACEMAKER DEVICE
ATRIAL PACING PM: 97
BATTERY VOLTAGE: 2.75 V
RV LEAD AMPLITUDE: 16 mv
VENTRICULAR PACING PM: 14.6

## 2013-03-09 ENCOUNTER — Encounter: Payer: Self-pay | Admitting: Cardiovascular Disease

## 2013-05-03 ENCOUNTER — Ambulatory Visit (INDEPENDENT_AMBULATORY_CARE_PROVIDER_SITE_OTHER): Payer: Medicare Other | Admitting: Cardiology

## 2013-05-03 ENCOUNTER — Encounter: Payer: Self-pay | Admitting: Cardiology

## 2013-05-03 VITALS — BP 182/93 | HR 94 | Ht 66.0 in | Wt 164.0 lb

## 2013-05-03 DIAGNOSIS — E782 Mixed hyperlipidemia: Secondary | ICD-10-CM

## 2013-05-03 DIAGNOSIS — I714 Abdominal aortic aneurysm, without rupture: Secondary | ICD-10-CM

## 2013-05-03 DIAGNOSIS — I4891 Unspecified atrial fibrillation: Secondary | ICD-10-CM

## 2013-05-03 DIAGNOSIS — I10 Essential (primary) hypertension: Secondary | ICD-10-CM

## 2013-05-03 MED ORDER — ATORVASTATIN CALCIUM 80 MG PO TABS: 80.0000 mg | ORAL_TABLET | Freq: Every day | ORAL | Status: DC

## 2013-05-03 NOTE — Patient Instructions (Addendum)
Your physician recommends that you schedule a follow-up appointment in: 3 months/WE WILL ESTABLISH YOU WITH OUR COUMADIN NURSE   Your physician recommends that you return for lab work in: 2 months for FASTING LIPID PANEL/CMP WE WILL MAIL YOU A LETTER   Your physician has recommended you make the following change in your medication:   1) START TAKING ATORVASTATIN 80MG  ONCE DAILY

## 2013-05-03 NOTE — Progress Notes (Signed)
Patient ID: Johnny Reynolds, male   DOB: June 02, 1941, 72 y.o.   MRN: [id number]    Clinical Summary Johnny Reynolds is a 72 y.o.male referred from [facility]  1. AAA - followed by vasc surgery, last visit 09/2012 - last [place] 09/2012 w/ diameter 3 cm, he is getting annual ultrasounds   2. HTN - checks at home regularly, typically 130/70s. Reports bp often higher in office.   3. 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 Also dilt 240 mg qday - last pacer interrogation 01/2013 showed no high ventricular rate episodes, 3% AF burden.  - no palpitations, no SOB, no DOE - compliant w/ coumadin, no issues w/ bleeding.  - QTc in clinic today 490 today  4. Tachy-brady syndrome: dual chamber permanent pacemaker - denies any lightheadedness, no dizziness, no presyncope or syncope.  5. CAD -remote history per old clinic notes, prior angioplasty in 1996 and 1997 - no recent chest pain     Past Medical History  Diagnosis Date  . Hypertension   . Diabetes mellitus   . Back pain, chronic   . Arrhythmia   . High cholesterol   . AAA (abdominal aortic aneurysm)   . 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 Fexofenadine Hcl Palpitations    Allegra     Current Outpatient Prescriptions  Medication Sig Dispense Refill  . aspirin 81 MG tablet Take 81 mg by mouth daily.      Marland Kitchen atorvastatin (LIPITOR) 40 MG tablet       . diltiazem (CARDIZEM CD) 240 MG 24 hr capsule       . hydrochlorothiazide (HYDRODIURIL) 25 MG tablet Take 1 tablet (25 mg total) by mouth daily.  10 tablet  0  . levothyroxine (SYNTHROID, LEVOTHROID) 50 MCG tablet       . losartan-hydrochlorothiazide (HYZAAR) 100-12.5 MG per tablet       . metFORMIN (GLUCOPHAGE) 500 MG tablet       . ONE TOUCH TEST STRIPS test strip       . sotalol (BETAPACE) 160 MG tablet       . warfarin (COUMADIN) 5 MG tablet        No  current facility-administered medications for this visit.     Past Surgical History  Procedure Laterality Date  . Pacemaker insertion  2008  . Coronary stent placement    . Hernia repair    . Cataract extraction  1983     Allergies  Allergen Reactions  . Fexofenadine Hcl Palpitations    Allegra      Family History  Problem Relation Age of Onset  . Peripheral vascular disease Mother   . Hypertension Mother   . Diabetes Mother   . Peripheral vascular disease Sister   . Peripheral vascular disease Brother   . Stroke Father 38    TIA     Social History Johnny Reynolds does not have a smoking history on file. He has never used smokeless tobacco. Johnny Reynolds reports that he does not drink alcohol.   Review of Systems 12 point ROS negative other than HPI  Physical Examination p 94 bp 184/93 Gen: NAD CV: RRR, no m/r/g, no JVD, no carotid bruits Pulm: CTAB Abd: soft, NT, ND Ext: no LE edema Neuro: A&Ox3, no focal deficits.    Diagnostic Studies Echo 03/2010 [facility]: LVEF 50-55%, mild LVH, LAE  03/2010 [facility]: no  ischemia, LVEF 50%.   01/2013 Pacer check Pacemaker remote check. Device function reviewed. Impedance, sensing, auto capture thresholds consistent with previous measurements. Histograms appropriate for patient and level of activity. All other diagnostic data reviewed and is appropriate and  stable for patient. Real time/magnet EGM shows appropriate sensing and capture. 1,553 mode switches---AF Burden 3.0% with 574 AHR episodes---max A >400, Max V 137---true AF seen per EGMs and markers. Patient is anticoagulated with Warfarin followed by  Dr.Golding. No ventricular high rate episodes. Estimated longevity of 3.5 years . Plan to follow up with Dr.Croitoru in September 2014 for next ppm interrogation.  ATRIAL PACING PM  97   VENTRICULAR PACING PM  14.6   BATTERY VOLTAGE v  2.75   AL IMPEDENCE PM ohm  535   RV LEAD IMPEDENCE PM ohm  575   RV LEAD AMPLITUDE mv    16   AL THRESHOLD v  0.5   RV LEAD THRESHOLD v  0.875   BAMS-0001 bpm  175       No recent labs   Assessment and Plan  1. AAA: routine screening exams, continue to follow w/ vascular.    2. CAD - no current symptoms, will continue risk factor modfication and secondary prevention. Change to high dose statin.   3. Parox afib - no symptoms, continue current therapy. Will set up coumadin clinic here as he would like to transfer his services here. QTc is 490, acceptable range for sotalol.  4. Tachy-brady syndrome - no current symptoms, last check 01/2013 showed normal function.   5. HL: change to high dose statin in setting of known CAD. Check LFTs prior to next visit as well as lipid panel.   F/u 3 months  Antoine Poche, M.D., F.A.C.C.

## 2013-05-04 ENCOUNTER — Encounter: Payer: Self-pay | Admitting: Cardiology

## 2013-05-17 ENCOUNTER — Ambulatory Visit (INDEPENDENT_AMBULATORY_CARE_PROVIDER_SITE_OTHER): Payer: Medicare Other | Admitting: *Deleted

## 2013-05-17 DIAGNOSIS — Z7901 Long term (current) use of anticoagulants: Secondary | ICD-10-CM | POA: Insufficient documentation

## 2013-05-17 DIAGNOSIS — I4891 Unspecified atrial fibrillation: Secondary | ICD-10-CM | POA: Insufficient documentation

## 2013-05-17 LAB — POCT INR: INR: 2.1

## 2013-06-08 ENCOUNTER — Telehealth: Payer: Self-pay | Admitting: *Deleted

## 2013-06-08 MED ORDER — WARFARIN SODIUM 5 MG PO TABS: 5.0000 mg | ORAL_TABLET | Freq: Every day | ORAL | Status: DC

## 2013-06-08 NOTE — Telephone Encounter (Signed)
PT WOULD LIKE A 90 DAY SUPPLY OF WARFRIN CALLED IN TO [facility] TODAY PLEASE.

## 2013-06-08 NOTE — Telephone Encounter (Signed)
Medication sent via escribe.  

## 2013-06-13 ENCOUNTER — Ambulatory Visit (INDEPENDENT_AMBULATORY_CARE_PROVIDER_SITE_OTHER): Payer: Medicare Other | Admitting: *Deleted

## 2013-06-13 ENCOUNTER — Encounter: Payer: Self-pay | Admitting: Internal Medicine

## 2013-06-13 ENCOUNTER — Ambulatory Visit (INDEPENDENT_AMBULATORY_CARE_PROVIDER_SITE_OTHER): Payer: Medicare Other | Admitting: Internal Medicine

## 2013-06-13 VITALS — BP 164/83 | HR 77 | Ht 66.0 in | Wt 168.0 lb

## 2013-06-13 DIAGNOSIS — Z7901 Long term (current) use of anticoagulants: Secondary | ICD-10-CM

## 2013-06-13 DIAGNOSIS — Z950 Presence of cardiac pacemaker: Secondary | ICD-10-CM

## 2013-06-13 DIAGNOSIS — I4891 Unspecified atrial fibrillation: Secondary | ICD-10-CM

## 2013-06-13 LAB — PACEMAKER DEVICE OBSERVATION
AL AMPLITUDE: 2 mv
AL IMPEDENCE PM: 527 Ohm
BAMS-0001: 175 {beats}/min
BATTERY VOLTAGE: 2.75 V
RV LEAD AMPLITUDE: 22.4 mv
RV LEAD IMPEDENCE PM: 612 Ohm

## 2013-06-13 LAB — POCT INR: INR: 2.1

## 2013-06-13 NOTE — Patient Instructions (Signed)
Your physician recommends that you schedule a follow-up appointment in: 1 year with Dr Ladona Ridgel and we will send a transmission on January 22nd.

## 2013-06-13 NOTE — Assessment & Plan Note (Signed)
The patient is maintaining sinus rhythm approximately 97% of the time. He will continue sotalol.

## 2013-06-13 NOTE — Progress Notes (Signed)
HPI Johnny Reynolds is referred today for ongoing evaluation and management of PAF, symptomatic bradycardia and s/p PPM. The patient feels well and has been stable. He'll on history of coronary artery disease with multiple angioplasties and stents. He currently denies anginal symptoms. He has never had a myocardial infarction. He has no congestive heart failure symptoms and only rare palpitations. 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  Allergies  Allergen Reactions  . Fexofenadine Hcl Palpitations    Allegra     Current Outpatient Prescriptions  Medication Sig Dispense Refill  . aspirin 81 MG tablet Take 81 mg by mouth 2 (two) times daily.       Marland Kitchen atorvastatin (LIPITOR) 80 MG tablet Take 1 tablet (80 mg total) by mouth daily.  90 tablet  1  . diltiazem (CARDIZEM CD) 240 MG 24 hr capsule Take by mouth daily.       Marland Kitchen levothyroxine (SYNTHROID, LEVOTHROID) 50 MCG tablet Take by mouth daily before breakfast.       . losartan-hydrochlorothiazide (HYZAAR) 100-12.5 MG per tablet Take 1 tablet by mouth daily.       . metFORMIN (GLUCOPHAGE) 500 MG tablet Take 500 mg by mouth 2 (two) times daily with a meal.       . ONE TOUCH TEST STRIPS test strip       . sotalol (BETAPACE) 160 MG tablet Take 160 mg by mouth 2 (two) times daily.       Marland Kitchen warfarin (COUMADIN) 5 MG tablet Take 1 tablet (5 mg total) by mouth daily.  90 tablet  0   No current facility-administered medications for this visit.     Past Medical History  Diagnosis Date  . Hypertension   . Diabetes mellitus   . Back pain, chronic   . Arrhythmia   . High cholesterol   . AAA (abdominal aortic aneurysm)   . Atrial fibrillation     ROS:   All systems reviewed and negative except as noted in the HPI.   Past Surgical History  Procedure Laterality Date  . Pacemaker insertion  2008  . Coronary stent placement    . Hernia repair    . Cataract extraction  1983     Family History    Problem Relation Age of Onset  . Peripheral vascular disease Mother   . Hypertension Mother   . Diabetes Mother   . Peripheral vascular disease Sister   . Peripheral vascular disease Brother   . Stroke Father 79    TIA     History   Social History  . Marital Status: Married    Spouse Name: N/A    Number of Children: N/A  . Years of Education: N/A   Occupational History  . Not on file.   Social History Main Topics  . Smoking status: Never Smoker   . Smokeless tobacco: Never Used  . Alcohol Use: No  . Drug Use: No  . Sexual Activity: Not on file   Other Topics Concern  . Not on file   Social History Narrative  . No narrative on file     BP 164/83  Pulse 77  Ht 5\' 6"  (1.676 m)  Wt 168 lb (76.204 kg)  BMI 27.13 kg/m2  Physical Exam:  Well appearing 72 year old man, NAD HEENT: Unremarkable Neck:  6 cm JVD, no thyromegally Back:  No CVA tenderness Lungs:  Clear with no wheezes, rales, or rhonchi. HEART:  Regular  rate rhythm, no murmurs, no rubs, no clicks Abd:  soft, positive bowel sounds, no organomegally, no rebound, no guarding Ext:  2 plus pulses, no edema, no cyanosis, no clubbing Skin:  No rashes no nodules Neuro:  CN II through XII intact, motor grossly intact   DEVICE  Normal device function.  See PaceArt for details.   Assess/Plan:

## 2013-06-13 NOTE — Assessment & Plan Note (Signed)
His Medtronic dual-chamber pacemaker is working normally. We'll plan to recheck in several months. 

## 2013-06-14 ENCOUNTER — Encounter: Payer: Self-pay | Admitting: Internal Medicine

## 2013-07-03 ENCOUNTER — Other Ambulatory Visit: Payer: Self-pay | Admitting: Cardiology

## 2013-07-03 LAB — LIPID PANEL
Cholesterol: 141 mg/dL (ref 0–200)
HDL: 45 mg/dL (ref >39)
Total CHOL/HDL Ratio: 3.1 Ratio
Triglycerides: 77 mg/dL (ref <150)

## 2013-07-03 LAB — COMPREHENSIVE METABOLIC PANEL
Albumin: 4.4 g/dL (ref 3.5–5.2)
BUN: 16 mg/dL (ref 6–23)
CO2: 29 mEq/L (ref 19–32)
Calcium: 9 mg/dL (ref 8.4–10.5)
Chloride: 102 mEq/L (ref 96–112)
Creat: 0.89 mg/dL (ref 0.50–1.35)
Glucose, Bld: 123 mg/dL — ABNORMAL HIGH (ref 70–99)

## 2013-07-06 ENCOUNTER — Encounter: Payer: Self-pay | Admitting: *Deleted

## 2013-07-12 ENCOUNTER — Encounter: Payer: Self-pay | Admitting: *Deleted

## 2013-07-16 ENCOUNTER — Ambulatory Visit (INDEPENDENT_AMBULATORY_CARE_PROVIDER_SITE_OTHER): Payer: Medicare Other | Admitting: *Deleted

## 2013-07-16 DIAGNOSIS — I4891 Unspecified atrial fibrillation: Secondary | ICD-10-CM

## 2013-07-16 DIAGNOSIS — Z7901 Long term (current) use of anticoagulants: Secondary | ICD-10-CM

## 2013-07-16 DIAGNOSIS — I714 Abdominal aortic aneurysm, without rupture: Secondary | ICD-10-CM

## 2013-08-06 ENCOUNTER — Ambulatory Visit (INDEPENDENT_AMBULATORY_CARE_PROVIDER_SITE_OTHER): Payer: Medicare Other | Admitting: Cardiology

## 2013-08-06 ENCOUNTER — Encounter: Payer: Self-pay | Admitting: Cardiology

## 2013-08-06 VITALS — BP 168/86 | HR 85 | Ht 66.0 in | Wt 168.0 lb

## 2013-08-06 DIAGNOSIS — E785 Hyperlipidemia, unspecified: Secondary | ICD-10-CM

## 2013-08-06 DIAGNOSIS — I4891 Unspecified atrial fibrillation: Secondary | ICD-10-CM

## 2013-08-06 DIAGNOSIS — I2510 Atherosclerotic heart disease of native coronary artery without angina pectoris: Secondary | ICD-10-CM

## 2013-08-06 DIAGNOSIS — I10 Essential (primary) hypertension: Secondary | ICD-10-CM

## 2013-08-06 NOTE — Patient Instructions (Addendum)
Your physician recommends that you schedule a follow-up appointment in: 6 months with Dr Branch You will receive a reminder letter two months in advance reminding you to call and schedule your appointment. If you don't receive this letter, please contact our office.  Your physician recommends that you continue on your current medications as directed. Please refer to the Current Medication list given to you today.   

## 2013-08-06 NOTE — Progress Notes (Signed)
Clinical Summary Johnny Reynolds is a 72 y.o.male seen today for follow up of the following medical problems.   1. AAA  - followed by vasc surgery, last visit 09/2012  - last [place] 09/2012 w/ diameter 3 cm, he is getting annual ultrasounds  - denies any abdominal symptoms  2. HTN  - checks at home regularly, typically 130/70s. Reports bp often higher in office.   3. Parox Afib  -on sotalol 160 mg bid, on approx since 2010. Also dilt 240 mg qday  - no palpitations, no SOB, no DOE  - compliant w/ coumadin, no issues w/ bleeding.  - 06/13/13 seen by EP, normal pacemaker function. Sinus rhythm noted 97% of the time on sotalol.   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  - normal pacemaker check 05/2013   5. CAD  -remote history per old clinic notes, prior angioplasty in 1996 and 1997 at [facility] - no recent chest pain  6. Hyperlipidemia - Last panel 06/2013 TC 141 TG 77 HDL 45 LDL 81 - compliant with lipitor, recently increased to lipitor 80mg   daily  Past Medical History  Diagnosis Date  . Hypertension   . Diabetes mellitus   . Back pain, chronic   . Arrhythmia   . High cholesterol   . AAA (abdominal aortic aneurysm)   . Atrial fibrillation      Allergies  Allergen Reactions  . Fexofenadine Hcl Palpitations    Allegra     Current Outpatient Prescriptions  Medication Sig Dispense Refill  . aspirin 81 MG tablet Take 81 mg by mouth 2 (two) times daily.       Marland Kitchen atorvastatin (LIPITOR) 80 MG tablet Take 1 tablet (80 mg total) by mouth daily.  90 tablet  1  . diltiazem (CARDIZEM CD) 240 MG 24 hr capsule Take by mouth daily.       Marland Kitchen levothyroxine (SYNTHROID, LEVOTHROID) 50 MCG tablet Take by mouth daily before breakfast.       . losartan-hydrochlorothiazide (HYZAAR) 100-12.5 MG per tablet Take 1 tablet by mouth daily.       . metFORMIN (GLUCOPHAGE) 500 MG tablet Take 500 mg by mouth 2  (two) times daily with a meal.       . ONE TOUCH TEST STRIPS test strip       . sotalol (BETAPACE) 160 MG tablet Take 160 mg by mouth 2 (two) times daily.       Marland Kitchen warfarin (COUMADIN) 5 MG tablet Take 1 tablet (5 mg total) by mouth daily.  90 tablet  0   No current facility-administered medications for this visit.     Past Surgical History  Procedure Laterality Date  . Pacemaker insertion  2008  . Coronary stent placement    . Hernia repair    . Cataract extraction  1983     Allergies  Allergen Reactions  . Fexofenadine Hcl Palpitations    Allegra      Family History  Problem Relation Age of Onset  . Peripheral vascular disease Mother   . Hypertension Mother   . Diabetes Mother   . Peripheral vascular disease Sister   . Peripheral vascular disease Brother   . Stroke Father 58    TIA     Social History Johnny Reynolds reports that he has never smoked. He has never used smokeless tobacco. Johnny Reynolds reports that he does not drink alcohol.   Review  of Systems CONSTITUTIONAL: No weight loss, fever, chills, weakness or fatigue.  HEENT: Eyes: No visual loss, blurred vision, double vision or yellow sclerae.No hearing loss, sneezing, congestion, runny nose or sore throat.  SKIN: No rash or itching.  CARDIOVASCULAR: per HPI RESPIRATORY: No shortness of breath, cough or sputum.  GASTROINTESTINAL: No anorexia, nausea, vomiting or diarrhea. No abdominal pain or blood.  GENITOURINARY: No burning on urination, no polyuria NEUROLOGICAL: No headache, dizziness, syncope, paralysis, ataxia, numbness or tingling in the extremities. No change in bowel or bladder control.  MUSCULOSKELETAL: No muscle, back pain, joint pain or stiffness.  LYMPHATICS: No enlarged nodes. No history of splenectomy.  PSYCHIATRIC: No history of depression or anxiety.  ENDOCRINOLOGIC: No reports of sweating, cold or heat intolerance. No polyuria or polydipsia.  Marland Kitchen   Physical Examination p 85 bp 168/86 Wt 168  lbs BMI 27 Gen: resting comfortably, no acute distress HEENT: no scleral icterus, pupils equal round and reactive, no palptable cervical adenopathy,  CV: RRR, no m/r/g, no JVD, on carotid bruits Resp: Clear to auscultation bilaterally GI: abdomen is soft, non-tender, non-distended, normal bowel sounds, no hepatosplenomegaly MSK: extremities are warm, no edema.  Skin: warm, no rash Neuro:  no focal deficits Psych: appropriate affect   Diagnostic Studies Echo 03/2010 [facility]: LVEF 50-55%, mild LVH, LAE   03/2010 MPI: no ischemia, LVEF 50%.     Assessment and Plan  1. AAA: routine screening exams, continue to follow w/ vascular.   2. CAD  - no current symptoms, will continue risk factor modfication and secondary prevention. Change to high dose statin.   3. Parox afib  - no symptoms, continue current therapy.   4. Tachy-brady syndrome  - no current symptoms, last check 05/2013 showed normal function.   5. HL: change to high dose statin in setting of known CAD. LDL not quite at goal of <70 given hx of CAD, fairly recently increased lipitor. Will continue to follow lipid panel, patient to continue making lifestyle modifications.        Antoine Poche, M.D., F.A.C.C.

## 2013-08-20 ENCOUNTER — Ambulatory Visit (INDEPENDENT_AMBULATORY_CARE_PROVIDER_SITE_OTHER): Payer: Medicare Other | Admitting: *Deleted

## 2013-08-20 DIAGNOSIS — Z5181 Encounter for therapeutic drug level monitoring: Secondary | ICD-10-CM

## 2013-08-20 DIAGNOSIS — I4891 Unspecified atrial fibrillation: Secondary | ICD-10-CM

## 2013-08-20 DIAGNOSIS — Z7901 Long term (current) use of anticoagulants: Secondary | ICD-10-CM

## 2013-08-27 ENCOUNTER — Other Ambulatory Visit: Payer: Self-pay

## 2013-08-27 ENCOUNTER — Telehealth: Payer: Self-pay | Admitting: *Deleted

## 2013-08-27 MED ORDER — WARFARIN SODIUM 5 MG PO TABS: 5.0000 mg | ORAL_TABLET | Freq: Every day | ORAL | Status: DC

## 2013-08-27 NOTE — Telephone Encounter (Signed)
[place] NEEDS CALLED IN TO  [facility]

## 2013-08-27 NOTE — Telephone Encounter (Signed)
Medication sent via escribe.  

## 2013-09-13 ENCOUNTER — Ambulatory Visit (INDEPENDENT_AMBULATORY_CARE_PROVIDER_SITE_OTHER): Payer: Medicare Other | Admitting: *Deleted

## 2013-09-13 DIAGNOSIS — I4891 Unspecified atrial fibrillation: Secondary | ICD-10-CM

## 2013-09-14 ENCOUNTER — Encounter: Payer: Self-pay | Admitting: Internal Medicine

## 2013-09-14 LAB — MDC_IDC_ENUM_SESS_TYPE_REMOTE
Battery Remaining Longevity: 3
Brady Statistic AP VP Percent: 21 %
Brady Statistic AS VP Percent: 3 %
Brady Statistic AS VS Percent: 5 %
Date Time Interrogation Session: 20150123015555
Lead Channel Impedance Value: 556 Ohm
Lead Channel Pacing Threshold Amplitude: 0.75 V
Lead Channel Sensing Intrinsic Amplitude: 16 mV
Lead Channel Setting Pacing Amplitude: 1.5 V
Lead Channel Setting Sensing Sensitivity: 5.6 mV
MDC IDC MSMT BATTERY IMPEDANCE: 1334 Ohm
MDC IDC MSMT BATTERY VOLTAGE: 2.75 V
MDC IDC MSMT LEADCHNL RA IMPEDANCE VALUE: 537 Ohm
MDC IDC MSMT LEADCHNL RA PACING THRESHOLD AMPLITUDE: 0.625 V
MDC IDC MSMT LEADCHNL RA PACING THRESHOLD PULSEWIDTH: 0.4 ms
MDC IDC MSMT LEADCHNL RV PACING THRESHOLD PULSEWIDTH: 0.4 ms
MDC IDC SET LEADCHNL RV PACING AMPLITUDE: 2 V
MDC IDC SET LEADCHNL RV PACING PULSEWIDTH: 0.4 ms
MDC IDC STAT BRADY AP VS PERCENT: 71 %

## 2013-09-17 ENCOUNTER — Encounter (INDEPENDENT_AMBULATORY_CARE_PROVIDER_SITE_OTHER): Payer: Self-pay

## 2013-09-17 ENCOUNTER — Ambulatory Visit (INDEPENDENT_AMBULATORY_CARE_PROVIDER_SITE_OTHER): Payer: Medicare Other | Admitting: *Deleted

## 2013-09-17 DIAGNOSIS — I4891 Unspecified atrial fibrillation: Secondary | ICD-10-CM

## 2013-09-17 DIAGNOSIS — Z7901 Long term (current) use of anticoagulants: Secondary | ICD-10-CM

## 2013-09-17 LAB — POCT INR: INR: 2.5

## 2013-09-17 MED ORDER — WARFARIN SODIUM 5 MG PO TABS: ORAL_TABLET | ORAL | Status: DC

## 2013-09-18 ENCOUNTER — Encounter: Payer: Self-pay | Admitting: *Deleted

## 2013-10-08 ENCOUNTER — Ambulatory Visit: Payer: Medicare Other | Admitting: Family

## 2013-10-08 ENCOUNTER — Other Ambulatory Visit (HOSPITAL_COMMUNITY): Payer: Medicare Other

## 2013-10-25 ENCOUNTER — Encounter: Payer: Self-pay | Admitting: Family

## 2013-10-26 ENCOUNTER — Encounter: Payer: Self-pay | Admitting: Family

## 2013-10-26 ENCOUNTER — Ambulatory Visit (INDEPENDENT_AMBULATORY_CARE_PROVIDER_SITE_OTHER): Payer: Medicare Other | Admitting: Family

## 2013-10-26 ENCOUNTER — Ambulatory Visit (HOSPITAL_COMMUNITY)
Admission: RE | Admit: 2013-10-26 | Discharge: 2013-10-26 | Disposition: A | Discharge status: Home / Self Care | Payer: Medicare Other | Source: Ambulatory Visit | Attending: Family | Admitting: Family

## 2013-10-26 VITALS — BP 153/73 | HR 45 | Resp 14 | Ht 66.0 in | Wt 162.0 lb

## 2013-10-26 DIAGNOSIS — I714 Abdominal aortic aneurysm, without rupture, unspecified: Secondary | ICD-10-CM | POA: Insufficient documentation

## 2013-10-26 DIAGNOSIS — Z48812 Encounter for surgical aftercare following surgery on the circulatory system: Secondary | ICD-10-CM | POA: Insufficient documentation

## 2013-10-26 NOTE — Patient Instructions (Signed)

## 2013-10-26 NOTE — Progress Notes (Signed)
[facility] [place]  Established Abdominal Aortic Aneurysm  History of Present Illness  Johnny Reynolds is a 73 y.o. (July 07, 1941) male patient of Dr. Donnetta Hutching followed for known AAA who presents with chief complaint: follow up for AAA.   Previous studies demonstrate an AAA, measuring 3.10 cm.   The patient does have c-spine issues, but no lumbar spine issues, denies abdominal pain.  The patient is not a smoker. The patient denies claudication in legs with walking. The patient denies history of stroke or TIA symptoms, denies MI, but did have angina in 1996, stent placed in LAD.  Pt Diabetic: Yes, states AIC stays at 6.0 or less, diagnosed in 2005 Pt smoker: non-smoker  Past Medical History  Diagnosis Date  . Hypertension   . Diabetes mellitus   . Back pain, chronic   . Arrhythmia   . High cholesterol   . AAA (abdominal aortic aneurysm)   . Atrial fibrillation   . Left shoulder pain 2014  . Sleep apnea Sept. 2008   Past Surgical History  Procedure Laterality Date  . Pacemaker insertion  2008  . Coronary stent placement  1996  . Hernia repair  2000  . Cataract extraction  1983  . Eye surgery  1983    Cataract   Social History History   Social History  . Marital Status: Married    Spouse Name: N/A    Number of Children: N/A  . Years of Education: N/A   Occupational History  . Not on file.   Social History Main Topics  . Smoking status: Never Smoker   . Smokeless tobacco: Never Used  . Alcohol Use: No  . Drug Use: No  . Sexual Activity: Not on file   Other Topics Concern  . Not on file   Social History Narrative  . No narrative on file   Family History Family History  Problem Relation Age of Onset  . Peripheral vascular disease Mother   . Hypertension Mother   . Diabetes Mother   . Heart disease Mother   . Hyperlipidemia Mother   . Peripheral vascular disease Sister   . Cancer Sister   . Diabetes Sister   . Heart disease Sister    Stent- Heart Disease before age 62  . Hyperlipidemia Sister   . Hypertension Sister   . Peripheral vascular disease Brother   . Diabetes Brother   . Heart disease Brother     CHF and Heart Disease before age 67  . Hyperlipidemia Brother   . Hypertension Brother   . Stroke Father 72    TIA    Current Outpatient Prescriptions on File Prior to Visit  Medication Sig Dispense Refill  . aspirin 81 MG tablet Take 81 mg by mouth 2 (two) times daily.       Marland Kitchen atorvastatin (LIPITOR) 80 MG tablet Take 1 tablet (80 mg total) by mouth daily.  90 tablet  1  . diltiazem (CARDIZEM CD) 240 MG 24 hr capsule Take by mouth daily.       Marland Kitchen levothyroxine (SYNTHROID, LEVOTHROID) 50 MCG tablet Take by mouth daily before breakfast.       . losartan-hydrochlorothiazide (HYZAAR) 100-12.5 MG per tablet Take 1 tablet by mouth daily.       . metFORMIN (GLUCOPHAGE) 500 MG tablet Take 500 mg by mouth 2 (two) times daily with a meal.       . ONE TOUCH TEST STRIPS test strip       .  sotalol (BETAPACE) 160 MG tablet Take 160 mg by mouth 2 (two) times daily.       Marland Kitchen warfarin (COUMADIN) 5 MG tablet Take coumadin 1 tablet daily except 1 1/2 tablets on Mondays and Thursdays  120 tablet  3   No current facility-administered medications on file prior to visit.   Allergies  Allergen Reactions  . Fexofenadine Hcl Palpitations    Allegra    ROS: See HPI for pertinent positives and negatives.  Physical Examination  Filed Vitals:   10/26/13 1019  BP: 153/73  Pulse: 45  Resp: 14  Height: 5\' 6"  (1.676 m)  Weight: 162 lb (73.483 kg)  SpO2: 95%   Body mass index is 26.16 kg/(m^2).  General: A&O x 3, WD, .  Pulmonary: Sym exp, good air movt, CTAB, no rales, rhonchi, or wheezing.  Cardiac: RRR, Nl S1, S2, no detected murmur.   Carotid Bruits Left Right   Negative Negative   Aorta is not palpable Radial pulses are 2+ palpable and =                          VASCULAR EXAM:                                                                                                          LE Pulses LEFT RIGHT       FEMORAL   palpable   palpable        POPLITEAL  not palpable   not palpable       POSTERIOR TIBIAL   palpable    palpable        DORSALIS PEDIS      ANTERIOR TIBIAL  palpable   palpable     Gastrointestinal: soft, NTND, -G/R, - HSM, - masses, - CVAT B.  Musculoskeletal: M/S 5/5 throughout, Extremities without ischemic changes.  Neurologic: CN 2-12 intact, Pain and light touch intact in extremities are intact, Motor exam as listed above.  Non-Invasive Vascular Imaging  AAA Duplex (10/26/2013)  Previous size: 3.10 cm (Date: 10/09/2012)  Current size:  2.92 cm (Date: 10/26/2013)  Medical Decision Making  The patient is a 73 y.o. male who presents with asymptomatic AAA with no increase in size.   Based on this patient's exam and diagnostic studies, the patient will follow up in 1 year with the following studies: AAA Duplex.  The threshold for repair is AAA size > 5.5 cm, growth > 1 cm/yr, and symptomatic status.  I emphasized the importance of maximal medical management including strict control of blood pressure, blood glucose, and lipid levels, antiplatelet agents, obtaining regular exercise, and continued cessation of smoking.   The patient was given information about AAA including signs, symptoms, treatment, and how to minimize the risk of enlargement and rupture of aneurysms.    The patient was advised to call 911 should the patient experience sudden onset abdominal or back pain.   Thank you for allowing [place] to participate in this patient's care.  Clemon Chambers, RN, MSN, FNP-C [facility] [place] Office: [place]: Bridgett Larsson  10/26/2013, 10:33 AM

## 2013-10-29 ENCOUNTER — Ambulatory Visit (INDEPENDENT_AMBULATORY_CARE_PROVIDER_SITE_OTHER): Payer: Medicare Other | Admitting: *Deleted

## 2013-10-29 DIAGNOSIS — I4891 Unspecified atrial fibrillation: Secondary | ICD-10-CM

## 2013-10-29 DIAGNOSIS — Z5181 Encounter for therapeutic drug level monitoring: Secondary | ICD-10-CM | POA: Insufficient documentation

## 2013-10-29 DIAGNOSIS — Z7901 Long term (current) use of anticoagulants: Secondary | ICD-10-CM

## 2013-10-29 LAB — POCT INR: INR: 2.9

## 2013-10-29 NOTE — Addendum Note (Signed)
Addended by: Reola Calkins on: 10/29/2013 02:44 PM   Modules accepted: Orders

## 2013-10-29 NOTE — Addendum Note (Signed)
Addended by: Reola Calkins on: 10/29/2013 02:58 PM   Modules accepted: Orders

## 2013-11-10 ENCOUNTER — Other Ambulatory Visit: Payer: Self-pay | Admitting: Cardiology

## 2013-12-10 ENCOUNTER — Ambulatory Visit (INDEPENDENT_AMBULATORY_CARE_PROVIDER_SITE_OTHER): Payer: Medicare Other | Admitting: *Deleted

## 2013-12-10 DIAGNOSIS — Z5181 Encounter for therapeutic drug level monitoring: Secondary | ICD-10-CM

## 2013-12-10 DIAGNOSIS — I4891 Unspecified atrial fibrillation: Secondary | ICD-10-CM

## 2013-12-10 DIAGNOSIS — Z7901 Long term (current) use of anticoagulants: Secondary | ICD-10-CM

## 2013-12-10 LAB — POCT INR: INR: 3

## 2013-12-17 ENCOUNTER — Encounter: Payer: Self-pay | Admitting: Internal Medicine

## 2013-12-17 ENCOUNTER — Ambulatory Visit (INDEPENDENT_AMBULATORY_CARE_PROVIDER_SITE_OTHER): Payer: Medicare Other | Admitting: *Deleted

## 2013-12-17 DIAGNOSIS — I498 Other specified cardiac arrhythmias: Secondary | ICD-10-CM

## 2013-12-17 DIAGNOSIS — I4891 Unspecified atrial fibrillation: Secondary | ICD-10-CM

## 2013-12-17 DIAGNOSIS — R001 Bradycardia, unspecified: Secondary | ICD-10-CM

## 2013-12-18 LAB — MDC_IDC_ENUM_SESS_TYPE_REMOTE
Battery Impedance: 1419 Ohm
Battery Remaining Longevity: 34 mo
Battery Voltage: 2.75 V
Lead Channel Pacing Threshold Amplitude: 0.75 V
Lead Channel Pacing Threshold Pulse Width: 0.4 ms
Lead Channel Pacing Threshold Pulse Width: 0.4 ms
Lead Channel Setting Pacing Amplitude: 1.5 V
Lead Channel Setting Pacing Amplitude: 2 V
Lead Channel Setting Pacing Pulse Width: 0.4 ms
MDC IDC MSMT LEADCHNL RA IMPEDANCE VALUE: 572 Ohm
MDC IDC MSMT LEADCHNL RA PACING THRESHOLD AMPLITUDE: 0.5 V
MDC IDC MSMT LEADCHNL RV IMPEDANCE VALUE: 614 Ohm
MDC IDC MSMT LEADCHNL RV SENSING INTR AMPL: 22.4 mV
MDC IDC SESS DTM: 20150428012817
MDC IDC SET LEADCHNL RV SENSING SENSITIVITY: 5.6 mV
MDC IDC STAT BRADY AP VP PERCENT: 17 %
MDC IDC STAT BRADY AP VS PERCENT: 78 %
MDC IDC STAT BRADY AS VP PERCENT: 2 %
MDC IDC STAT BRADY AS VS PERCENT: 3 %

## 2014-01-03 ENCOUNTER — Encounter: Payer: Self-pay | Admitting: Cardiology

## 2014-01-21 ENCOUNTER — Ambulatory Visit (INDEPENDENT_AMBULATORY_CARE_PROVIDER_SITE_OTHER): Payer: Medicare Other | Admitting: *Deleted

## 2014-01-21 DIAGNOSIS — Z5181 Encounter for therapeutic drug level monitoring: Secondary | ICD-10-CM

## 2014-01-21 DIAGNOSIS — I4891 Unspecified atrial fibrillation: Secondary | ICD-10-CM

## 2014-01-21 DIAGNOSIS — Z7901 Long term (current) use of anticoagulants: Secondary | ICD-10-CM

## 2014-01-21 LAB — POCT INR: INR: 2.8

## 2014-02-06 ENCOUNTER — Ambulatory Visit (INDEPENDENT_AMBULATORY_CARE_PROVIDER_SITE_OTHER): Payer: Medicare Other | Admitting: Cardiology

## 2014-02-06 ENCOUNTER — Encounter: Payer: Self-pay | Admitting: Cardiology

## 2014-02-06 VITALS — BP 150/86 | HR 80 | Ht 66.0 in | Wt 160.0 lb

## 2014-02-06 DIAGNOSIS — I495 Sick sinus syndrome: Secondary | ICD-10-CM

## 2014-02-06 DIAGNOSIS — I4891 Unspecified atrial fibrillation: Secondary | ICD-10-CM

## 2014-02-06 DIAGNOSIS — I10 Essential (primary) hypertension: Secondary | ICD-10-CM

## 2014-02-06 DIAGNOSIS — E782 Mixed hyperlipidemia: Secondary | ICD-10-CM

## 2014-02-06 NOTE — Patient Instructions (Addendum)
Your physician wants you to follow-up in: 1 year. You will receive a reminder letter in the mail two months in advance. If you don't receive a letter, please call our office to schedule the follow-up appointment.  Your physician recommends that you continue on your current medications as directed. Please refer to the Current Medication list given to you today.  Thank you for choosing [facility]!!    

## 2014-02-06 NOTE — Progress Notes (Signed)
Clinical Summary [name] is a 73 y.o.male seen today for follow up of the following medical problems.   1. AAA  - followed by vascular, last [place] 09/2013 stable   2. HTN  - checks at home regularly, typically 130/70s. Reports bp often higher in office.   3. Parox Afib  -on sotalol 160 mg bid, on approx since 2010. Also dilt 240 mg qday  - no palpitations, no SOB, no DOE  - compliant w/ coumadin, no issues w/ bleeding.  - 06/13/13 seen by EP, normal pacemaker function. Sinus rhythm noted 97% of the time on sotalol.   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  - normal pacemaker check 11/2013 normal function. IN AF 3.5% of the time, no ventricular high rates  5. CAD  -remote history per old clinic notes, prior angioplasty in 1996 and 1997 at [facility]  - no recent chest pain   6. Hyperlipidemia  - Last panel 06/2013 TC 141 TG 77 HDL 45 LDL 81  - compliant with lipitor, recently increased to lipitor 80mg  daily - reports upcoming physical with labs July     Past Medical History  Diagnosis Date  . Hypertension   . Diabetes mellitus   . Back pain, chronic   . Arrhythmia   . High cholesterol   . AAA (abdominal aortic aneurysm)   . Atrial fibrillation   . Left shoulder pain 2014  . Sleep apnea Sept. 2008     Allergies  Allergen Reactions  . Fexofenadine Hcl Palpitations    Allegra     Current Outpatient Prescriptions  Medication Sig Dispense Refill  . aspirin 81 MG tablet Take 81 mg by mouth 2 (two) times daily.       Marland Kitchen atorvastatin (LIPITOR) 80 MG tablet TAKE ONE TABLET BY MOUTH DAILY.  90 tablet  3  . diltiazem (CARDIZEM CD) 240 MG 24 hr capsule Take by mouth daily.       Marland Kitchen levothyroxine (SYNTHROID, LEVOTHROID) 50 MCG tablet Take by mouth daily before breakfast.       . losartan-hydrochlorothiazide (HYZAAR) 100-12.5 MG per tablet Take 1 tablet by mouth daily.       .  metFORMIN (GLUCOPHAGE) 500 MG tablet Take 500 mg by mouth 2 (two) times daily with a meal.       . ONE TOUCH TEST STRIPS test strip       . saw palmetto 500 MG capsule Take 500 mg by mouth daily. 3 X's per week      . sotalol (BETAPACE) 160 MG tablet Take 160 mg by mouth 2 (two) times daily.       Marland Kitchen warfarin (COUMADIN) 5 MG tablet Take coumadin 1 tablet daily except 1 1/2 tablets on Mondays and Thursdays  120 tablet  3   No current facility-administered medications for this visit.     Past Surgical History  Procedure Laterality Date  . Pacemaker insertion  2008  . Coronary stent placement  1996  . Hernia repair  2000  . Cataract extraction  1983  . Eye surgery  1983    Cataract     Allergies  Allergen Reactions  . Fexofenadine Hcl Palpitations    Allegra      Family History  Problem Relation Age of Onset  . Peripheral vascular disease Mother   . Hypertension Mother   . Diabetes Mother   . Heart disease Mother   .  Hyperlipidemia Mother   . Peripheral vascular disease Sister   . Cancer Sister   . Diabetes Sister   . Heart disease Sister     Stent- Heart Disease before age 87  . Hyperlipidemia Sister   . Hypertension Sister   . Peripheral vascular disease Brother   . Diabetes Brother   . Heart disease Brother     CHF and Heart Disease before age 83  . Hyperlipidemia Brother   . Hypertension Brother   . Stroke Father 55    TIA     Social History Johnny Reynolds reports that he has never smoked. He has never used smokeless tobacco. Johnny Reynolds reports that he does not drink alcohol.   Review of Systems CONSTITUTIONAL: No weight loss, fever, chills, weakness or fatigue.  HEENT: Eyes: No visual loss, blurred vision, double vision or yellow sclerae.No hearing loss, sneezing, congestion, runny nose or sore throat.  SKIN: No rash or itching.  CARDIOVASCULAR: per HPI RESPIRATORY: No shortness of breath, cough or sputum.  GASTROINTESTINAL: No anorexia, nausea, vomiting or  diarrhea. No abdominal pain or blood.  GENITOURINARY: No burning on urination, no polyuria NEUROLOGICAL: No headache, dizziness, syncope, paralysis, ataxia, numbness or tingling in the extremities. No change in bowel or bladder control.  MUSCULOSKELETAL: No muscle, back pain, joint pain or stiffness.  LYMPHATICS: No enlarged nodes. No history of splenectomy.  PSYCHIATRIC: No history of depression or anxiety.  ENDOCRINOLOGIC: No reports of sweating, cold or heat intolerance. No polyuria or polydipsia.  Marland Kitchen   Physical Examination p 80 bp 150/86 Wt 160 lbs BMI 26 Gen: resting comfortably, no acute distress HEENT: no scleral icterus, pupils equal round and reactive, no palptable cervical adenopathy,  CV: RRR, no m/r/g, no JVD, no carotid bruits Resp: Clear to auscultation bilaterally GI: abdomen is soft, non-tender, non-distended, normal bowel sounds, no hepatosplenomegaly MSK: extremities are warm, no edema.  Skin: warm, no rash Neuro:  no focal deficits Psych: appropriate affect   Diagnostic Studies Echo 03/2010 [facility]: LVEF 50-55%, mild LVH, LAE   03/2010 [facility]: no ischemia, LVEF 50%.    02/06/14 EKG Atrial paced, QTc 490  Assessment and Plan  1. AAA: routine screening exams, continue to follow w/ vascular. Has been stable.  2. CAD  - no current symptoms, will continue risk factor modfication and secondary prevention.   3. Parox afib  - no symptoms, continue current therapy. Acceptable QTc on sotalol - discussed NOACS, he is happy on coumadin, will continue  4. Tachy-brady syndrome  - no current symptoms, last pacer check 11/2013 showed normal function.   5. HL: - recently increased lipitor, reports upcoming blood work with pcp in July. Follow up results, goal LDL<70  6. HTN - typically elevated in clinical setting, home numbers at goal. Continue current meds, bp goal given his DM is <130/80  F/u 1 year    Arnoldo Lenis, M.D., F.A.C.C.

## 2014-02-06 NOTE — Addendum Note (Signed)
Addended by: Hilarie Fredrickson T on: 02/06/2014 09:41 AM   Modules accepted: Orders

## 2014-03-06 ENCOUNTER — Ambulatory Visit (INDEPENDENT_AMBULATORY_CARE_PROVIDER_SITE_OTHER): Payer: Medicare Other | Admitting: *Deleted

## 2014-03-06 DIAGNOSIS — Z7901 Long term (current) use of anticoagulants: Secondary | ICD-10-CM

## 2014-03-06 DIAGNOSIS — I4891 Unspecified atrial fibrillation: Secondary | ICD-10-CM

## 2014-03-06 DIAGNOSIS — Z5181 Encounter for therapeutic drug level monitoring: Secondary | ICD-10-CM

## 2014-03-06 LAB — POCT INR: INR: 2.5

## 2014-03-11 ENCOUNTER — Other Ambulatory Visit: Payer: Self-pay

## 2014-03-11 MED ORDER — SOTALOL HCL 160 MG PO TABS: 160.0000 mg | ORAL_TABLET | Freq: Two times a day (BID) | ORAL | Status: DC

## 2014-03-11 NOTE — Progress Notes (Signed)
Refill request complete 

## 2014-03-21 ENCOUNTER — Ambulatory Visit (INDEPENDENT_AMBULATORY_CARE_PROVIDER_SITE_OTHER): Payer: Medicare Other | Admitting: *Deleted

## 2014-03-21 DIAGNOSIS — Z950 Presence of cardiac pacemaker: Secondary | ICD-10-CM

## 2014-03-21 DIAGNOSIS — I4891 Unspecified atrial fibrillation: Secondary | ICD-10-CM

## 2014-03-21 DIAGNOSIS — I480 Paroxysmal atrial fibrillation: Secondary | ICD-10-CM

## 2014-03-21 DIAGNOSIS — R001 Bradycardia, unspecified: Secondary | ICD-10-CM

## 2014-03-21 DIAGNOSIS — I498 Other specified cardiac arrhythmias: Secondary | ICD-10-CM

## 2014-03-28 LAB — MDC_IDC_ENUM_SESS_TYPE_REMOTE
Battery Impedance: 1620 Ohm
Battery Remaining Longevity: 31 mo
Brady Statistic AP VS Percent: 84 %
Brady Statistic AS VS Percent: 2 %
Date Time Interrogation Session: 20150731202813
Lead Channel Impedance Value: 610 Ohm
Lead Channel Impedance Value: 641 Ohm
Lead Channel Pacing Threshold Pulse Width: 0.4 ms
Lead Channel Setting Pacing Amplitude: 1.5 V
Lead Channel Setting Pacing Amplitude: 2 V
Lead Channel Setting Sensing Sensitivity: 5.6 mV
MDC IDC MSMT BATTERY VOLTAGE: 2.74 V
MDC IDC MSMT LEADCHNL RA PACING THRESHOLD AMPLITUDE: 0.625 V
MDC IDC MSMT LEADCHNL RA PACING THRESHOLD PULSEWIDTH: 0.4 ms
MDC IDC MSMT LEADCHNL RV PACING THRESHOLD AMPLITUDE: 0.875 V
MDC IDC MSMT LEADCHNL RV SENSING INTR AMPL: 16 mV
MDC IDC SET LEADCHNL RV PACING PULSEWIDTH: 0.4 ms
MDC IDC STAT BRADY AP VP PERCENT: 12 %
MDC IDC STAT BRADY AS VP PERCENT: 1 %

## 2014-04-05 ENCOUNTER — Encounter: Payer: Self-pay | Admitting: Cardiology

## 2014-04-08 ENCOUNTER — Encounter: Payer: Self-pay | Admitting: Internal Medicine

## 2014-04-18 ENCOUNTER — Ambulatory Visit (INDEPENDENT_AMBULATORY_CARE_PROVIDER_SITE_OTHER): Payer: Medicare Other | Admitting: *Deleted

## 2014-04-18 DIAGNOSIS — Z7901 Long term (current) use of anticoagulants: Secondary | ICD-10-CM

## 2014-04-18 DIAGNOSIS — I4891 Unspecified atrial fibrillation: Secondary | ICD-10-CM

## 2014-04-18 DIAGNOSIS — Z5181 Encounter for therapeutic drug level monitoring: Secondary | ICD-10-CM

## 2014-04-18 LAB — POCT INR: INR: 2.3

## 2014-05-29 ENCOUNTER — Ambulatory Visit (INDEPENDENT_AMBULATORY_CARE_PROVIDER_SITE_OTHER): Payer: Medicare Other | Admitting: *Deleted

## 2014-05-29 DIAGNOSIS — Z5181 Encounter for therapeutic drug level monitoring: Secondary | ICD-10-CM

## 2014-05-29 DIAGNOSIS — I4891 Unspecified atrial fibrillation: Secondary | ICD-10-CM

## 2014-05-29 DIAGNOSIS — Z7901 Long term (current) use of anticoagulants: Secondary | ICD-10-CM

## 2014-05-29 LAB — POCT INR: INR: 2.5

## 2014-07-04 ENCOUNTER — Encounter (INDEPENDENT_AMBULATORY_CARE_PROVIDER_SITE_OTHER): Payer: Self-pay | Admitting: *Deleted

## 2014-07-10 ENCOUNTER — Other Ambulatory Visit (INDEPENDENT_AMBULATORY_CARE_PROVIDER_SITE_OTHER): Payer: Self-pay | Admitting: *Deleted

## 2014-07-10 ENCOUNTER — Telehealth (INDEPENDENT_AMBULATORY_CARE_PROVIDER_SITE_OTHER): Payer: Self-pay | Admitting: *Deleted

## 2014-07-10 DIAGNOSIS — Z86010 Personal history of colonic polyps: Secondary | ICD-10-CM

## 2014-07-10 DIAGNOSIS — Z1211 Encounter for screening for malignant neoplasm of colon: Secondary | ICD-10-CM

## 2014-07-10 NOTE — Telephone Encounter (Signed)
Patient needs movi prep 

## 2014-07-11 MED ORDER — PEG-KCL-NACL-NASULF-NA ASC-C 100 G PO SOLR: 1.0000 | Freq: Once | ORAL | Status: DC

## 2014-07-12 ENCOUNTER — Encounter: Payer: Self-pay | Admitting: Internal Medicine

## 2014-07-12 ENCOUNTER — Ambulatory Visit (INDEPENDENT_AMBULATORY_CARE_PROVIDER_SITE_OTHER): Payer: Medicare Other | Admitting: Internal Medicine

## 2014-07-12 VITALS — BP 138/72 | HR 74 | Ht 66.0 in | Wt 146.1 lb

## 2014-07-12 DIAGNOSIS — Z950 Presence of cardiac pacemaker: Secondary | ICD-10-CM

## 2014-07-12 DIAGNOSIS — I10 Essential (primary) hypertension: Secondary | ICD-10-CM | POA: Insufficient documentation

## 2014-07-12 DIAGNOSIS — R001 Bradycardia, unspecified: Secondary | ICD-10-CM

## 2014-07-12 DIAGNOSIS — I480 Paroxysmal atrial fibrillation: Secondary | ICD-10-CM

## 2014-07-12 LAB — MDC_IDC_ENUM_SESS_TYPE_INCLINIC
Battery Voltage: 2.73 V
Brady Statistic AP VP Percent: 9 %
Brady Statistic AP VS Percent: 88 %
Brady Statistic AS VP Percent: 1 %
Date Time Interrogation Session: 20151120084642
Lead Channel Impedance Value: 572 Ohm
Lead Channel Impedance Value: 589 Ohm
Lead Channel Pacing Threshold Amplitude: 0.75 V
Lead Channel Pacing Threshold Amplitude: 0.75 V
Lead Channel Pacing Threshold Pulse Width: 0.4 ms
Lead Channel Sensing Intrinsic Amplitude: 31.36 mV
Lead Channel Setting Pacing Amplitude: 2 V
Lead Channel Setting Pacing Pulse Width: 0.4 ms
MDC IDC MSMT BATTERY IMPEDANCE: 1798 Ohm
MDC IDC MSMT BATTERY REMAINING LONGEVITY: 28 mo
MDC IDC MSMT LEADCHNL RV PACING THRESHOLD PULSEWIDTH: 0.4 ms
MDC IDC MSMT LEADCHNL RV SENSING INTR AMPL: 22.4 mV
MDC IDC SET LEADCHNL RA PACING AMPLITUDE: 1.5 V
MDC IDC SET LEADCHNL RV SENSING SENSITIVITY: 5.6 mV
MDC IDC STAT BRADY AS VS PERCENT: 2 %

## 2014-07-12 LAB — PACEMAKER DEVICE OBSERVATION

## 2014-07-12 NOTE — Assessment & Plan Note (Signed)
His blood pressure has actually been on the low side in the setting of his weight loss. He will continue his current meds.

## 2014-07-12 NOTE — Progress Notes (Signed)
HPI [name] returns today for ongoing evaluation and management of PAF, symptomatic bradycardia and s/p PPM. The patient feels well and has been stable. He'll on history of coronary artery disease with multiple angioplasties and stents. He currently denies anginal symptoms. He has never had a myocardial infarction. He has no congestive heart failure symptoms and only rare palpitations. 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 He has lost almost 20 lbs in the past year and reduced his medications.  Allergies  Allergen Reactions  . Fexofenadine Hcl Palpitations    Allegra     Current Outpatient Prescriptions  Medication Sig Dispense Refill  . aspirin 81 MG tablet Take 81 mg by mouth 2 (two) times daily.     Marland Kitchen atorvastatin (LIPITOR) 80 MG tablet TAKE ONE TABLET BY MOUTH DAILY. 90 tablet 3  . diltiazem (CARDIZEM CD) 240 MG 24 hr capsule Take by mouth daily.     Marland Kitchen levothyroxine (SYNTHROID, LEVOTHROID) 50 MCG tablet Take by mouth daily before breakfast.     . losartan-hydrochlorothiazide (HYZAAR) 100-12.5 MG per tablet Take 0.5 tablets by mouth daily.     . saw palmetto 500 MG capsule Take 500 mg by mouth daily. 3 X's per week    . sotalol (BETAPACE) 160 MG tablet Take 1 tablet (160 mg total) by mouth 2 (two) times daily. 180 tablet 3  . warfarin (COUMADIN) 5 MG tablet Take coumadin 1 tablet daily except 1 1/2 tablets on Mondays and Thursdays 120 tablet 3   No current facility-administered medications for this visit.     Past Medical History  Diagnosis Date  . Hypertension   . Diabetes mellitus   . Back pain, chronic   . Arrhythmia   . High cholesterol   . AAA (abdominal aortic aneurysm)   . Atrial fibrillation   . Left shoulder pain 2014  . Sleep apnea Sept. 2008    ROS:   All systems reviewed and negative except as noted in the HPI.   Past Surgical History  Procedure Laterality Date  . Pacemaker insertion  2008  . Coronary  stent placement  1996  . Hernia repair  2000  . Cataract extraction  1983  . Eye surgery  1983    Cataract     Family History  Problem Relation Age of Onset  . Peripheral vascular disease Mother   . Hypertension Mother   . Diabetes Mother   . Heart disease Mother   . Hyperlipidemia Mother   . Peripheral vascular disease Sister   . Cancer Sister   . Diabetes Sister   . Heart disease Sister     Stent- Heart Disease before age 17  . Hyperlipidemia Sister   . Hypertension Sister   . Peripheral vascular disease Brother   . Diabetes Brother   . Heart disease Brother     CHF and Heart Disease before age 18  . Hyperlipidemia Brother   . Hypertension Brother   . Stroke Father 72    TIA     History   Social History  . Marital Status: Married    Spouse Name: N/A    Number of Children: N/A  . Years of Education: N/A   Occupational History  . Not on file.   Social History Main Topics  . Smoking status: Never Smoker   . Smokeless tobacco: Never Used  . Alcohol Use: No  . Drug Use: No  . Sexual Activity: Not  on file   Other Topics Concern  . Not on file   Social History Narrative     BP 138/72 mmHg  Pulse 74  Ht 5\' 6"  (1.676 m)  Wt 146 lb 1.9 oz (66.28 kg)  BMI 23.60 kg/m2  SpO2 98%  Physical Exam:  Well appearing 73 year old man, NAD HEENT: Unremarkable Neck:  6 cm JVD, no thyromegally Back:  No CVA tenderness Lungs:  Clear with no wheezes, rales, or rhonchi. HEART:  Regular rate rhythm, no murmurs, no rubs, no clicks Abd:  soft, positive bowel sounds, no organomegally, no rebound, no guarding Ext:  2 plus pulses, no edema, no cyanosis, no clubbing Skin:  No rashes no nodules Neuro:  CN II through XII intact, motor grossly intact   DEVICE  Normal device function.  See PaceArt for details.   Assess/Plan:

## 2014-07-12 NOTE — Patient Instructions (Addendum)
Remote monitoring is used to monitor your Pacemaker of ICD from home. This monitoring reduces the number of office visits required to check your device to one time per year. It allows [place] to keep an eye on the functioning of your device to ensure it is working properly. You are scheduled for a device check from home on FEBRUARY 22ND. You may send your transmission at any time that day. If you have a wireless device, the transmission will be sent automatically. After your physician reviews your transmission, you will receive a postcard with your next transmission date.  Your physician wants you to follow-up in: 1 year with Dr. Lovena Le. You will receive a reminder letter in the mail two months in advance. If you don't receive a letter, please call our office to schedule the follow-up appointment.   Your physician recommends that you continue on your current medications as directed. Please refer to the Current Medication list given to you today.  Thank you for choosing [facility]!!

## 2014-07-12 NOTE — Assessment & Plan Note (Signed)
He is maintaining NSR 98% of the time. He will continue his current medical regimen.

## 2014-07-12 NOTE — Assessment & Plan Note (Signed)
His medtronic DDD PM is working normally. Will recheck in several months. 

## 2014-07-15 ENCOUNTER — Ambulatory Visit (INDEPENDENT_AMBULATORY_CARE_PROVIDER_SITE_OTHER): Payer: Medicare Other | Admitting: *Deleted

## 2014-07-15 DIAGNOSIS — I4891 Unspecified atrial fibrillation: Secondary | ICD-10-CM

## 2014-07-15 DIAGNOSIS — Z7901 Long term (current) use of anticoagulants: Secondary | ICD-10-CM

## 2014-07-15 DIAGNOSIS — Z5181 Encounter for therapeutic drug level monitoring: Secondary | ICD-10-CM

## 2014-07-15 LAB — POCT INR: INR: 2.4

## 2014-07-26 ENCOUNTER — Telehealth (INDEPENDENT_AMBULATORY_CARE_PROVIDER_SITE_OTHER): Payer: Self-pay | Admitting: *Deleted

## 2014-07-26 NOTE — Telephone Encounter (Signed)
Patient is scheduled for colonoscopy 09/04/14 and needs to stop Coumdin 5 days and ASA 2 days prior -- is this ok with Dr Harl Bowie -- thanks

## 2014-07-29 NOTE — Telephone Encounter (Signed)
Please advise 

## 2014-07-30 NOTE — Telephone Encounter (Signed)
Dr Harl Bowie is this OK with you?

## 2014-08-01 NOTE — Telephone Encounter (Signed)
Yes, holding coumadin and ASA for his procedure as needed is fine.  Zandra Abts MD

## 2014-08-01 NOTE — Telephone Encounter (Signed)
Ann, OK to hold coumadin x 5 days and ASA x 2 days per Dr Harl Bowie

## 2014-08-01 NOTE — Telephone Encounter (Signed)
Left patient a message advising ok to hold coumadin and ASA

## 2014-08-06 ENCOUNTER — Telehealth (INDEPENDENT_AMBULATORY_CARE_PROVIDER_SITE_OTHER): Payer: Self-pay | Admitting: *Deleted

## 2014-08-06 NOTE — Telephone Encounter (Signed)
Referring MD/PCP: golding   Procedure: tcs  Reason/Indication:  Hx polyps  Has patient had this procedure before?  Yes, 2010 -- scanned  If so, when, by whom and where?    Is there a family history of colon cancer?  no  Who?  What age when diagnosed?    Is patient diabetic?   Yes, diet control      Does patient have prosthetic heart valve?  no  Do you have a pacemaker?  yes  Has patient ever had endocarditis? no  Has patient had joint replacement within last 12 months?    Does patient tend to be constipated or take laxatives?   Is patient on Coumadin, Plavix and/or Aspirin? yes  Medications: see epic  Allergies: see epic  Medication Adjustment: warfarin 5 days & asa 2 days  Procedure date & time: 09/04/14 at 1030

## 2014-08-09 NOTE — Telephone Encounter (Signed)
agree

## 2014-08-21 ENCOUNTER — Ambulatory Visit (INDEPENDENT_AMBULATORY_CARE_PROVIDER_SITE_OTHER): Payer: Medicare Other | Admitting: *Deleted

## 2014-08-21 DIAGNOSIS — Z7901 Long term (current) use of anticoagulants: Secondary | ICD-10-CM

## 2014-08-21 DIAGNOSIS — I4891 Unspecified atrial fibrillation: Secondary | ICD-10-CM

## 2014-08-21 DIAGNOSIS — Z5181 Encounter for therapeutic drug level monitoring: Secondary | ICD-10-CM

## 2014-08-21 LAB — POCT INR: INR: 2.5

## 2014-09-04 ENCOUNTER — Encounter (HOSPITAL_COMMUNITY): Payer: Self-pay | Admitting: *Deleted

## 2014-09-04 ENCOUNTER — Encounter (HOSPITAL_COMMUNITY)
Admission: RE | Disposition: A | Discharge status: Home / Self Care | Payer: Self-pay | Source: Ambulatory Visit | Attending: Internal Medicine

## 2014-09-04 ENCOUNTER — Ambulatory Visit (HOSPITAL_COMMUNITY)
Admission: RE | Admit: 2014-09-04 | Discharge: 2014-09-04 | Disposition: A | Discharge status: Home / Self Care | Payer: Medicare Other | Source: Ambulatory Visit | Attending: Internal Medicine | Admitting: Internal Medicine

## 2014-09-04 DIAGNOSIS — M549 Dorsalgia, unspecified: Secondary | ICD-10-CM | POA: Diagnosis not present

## 2014-09-04 DIAGNOSIS — I4891 Unspecified atrial fibrillation: Secondary | ICD-10-CM | POA: Diagnosis not present

## 2014-09-04 DIAGNOSIS — K5730 Diverticulosis of large intestine without perforation or abscess without bleeding: Secondary | ICD-10-CM | POA: Diagnosis not present

## 2014-09-04 DIAGNOSIS — Z09 Encounter for follow-up examination after completed treatment for conditions other than malignant neoplasm: Secondary | ICD-10-CM | POA: Diagnosis present

## 2014-09-04 DIAGNOSIS — Z7901 Long term (current) use of anticoagulants: Secondary | ICD-10-CM | POA: Insufficient documentation

## 2014-09-04 DIAGNOSIS — D120 Benign neoplasm of cecum: Secondary | ICD-10-CM | POA: Insufficient documentation

## 2014-09-04 DIAGNOSIS — D127 Benign neoplasm of rectosigmoid junction: Secondary | ICD-10-CM | POA: Insufficient documentation

## 2014-09-04 DIAGNOSIS — Z79899 Other long term (current) drug therapy: Secondary | ICD-10-CM | POA: Diagnosis not present

## 2014-09-04 DIAGNOSIS — G8929 Other chronic pain: Secondary | ICD-10-CM | POA: Insufficient documentation

## 2014-09-04 DIAGNOSIS — Z86010 Personal history of colonic polyps: Secondary | ICD-10-CM

## 2014-09-04 DIAGNOSIS — G4730 Sleep apnea, unspecified: Secondary | ICD-10-CM | POA: Insufficient documentation

## 2014-09-04 DIAGNOSIS — I10 Essential (primary) hypertension: Secondary | ICD-10-CM | POA: Diagnosis not present

## 2014-09-04 DIAGNOSIS — E780 Pure hypercholesterolemia: Secondary | ICD-10-CM | POA: Diagnosis not present

## 2014-09-04 DIAGNOSIS — Z7982 Long term (current) use of aspirin: Secondary | ICD-10-CM | POA: Diagnosis not present

## 2014-09-04 DIAGNOSIS — K649 Unspecified hemorrhoids: Secondary | ICD-10-CM | POA: Diagnosis not present

## 2014-09-04 HISTORY — PX: COLONOSCOPY: SHX5424

## 2014-09-04 SURGERY — COLONOSCOPY
Anesthesia: Moderate Sedation

## 2014-09-04 MED ORDER — SODIUM CHLORIDE 0.9 % IV SOLN
INTRAVENOUS | Status: DC
  Administered 2014-09-04: 10:00:00 via INTRAVENOUS

## 2014-09-04 MED ORDER — STERILE WATER FOR IRRIGATION IR SOLN
Status: DC | PRN
  Administered 2014-09-04: 10:00:00

## 2014-09-04 MED ORDER — MIDAZOLAM HCL 5 MG/5ML IJ SOLN
INTRAMUSCULAR | Status: DC | PRN
  Administered 2014-09-04 (×2): 2 mg via INTRAVENOUS
  Administered 2014-09-04: 1 mg via INTRAVENOUS

## 2014-09-04 MED ORDER — MEPERIDINE HCL 50 MG/ML IJ SOLN
INTRAMUSCULAR | Status: DC | PRN
  Administered 2014-09-04 (×2): 25 mg via INTRAVENOUS

## 2014-09-04 MED ORDER — MEPERIDINE HCL 50 MG/ML IJ SOLN
INTRAMUSCULAR | Status: AC
  Filled 2014-09-04: qty 1

## 2014-09-04 MED ORDER — MIDAZOLAM HCL 5 MG/5ML IJ SOLN
INTRAMUSCULAR | Status: AC
  Filled 2014-09-04: qty 10

## 2014-09-04 NOTE — Op Note (Signed)
COLONOSCOPY PROCEDURE REPORT  PATIENT:  Johnny Reynolds  MR#:  [id number] Birthdate:  [birth year], 74 y.o., male Endoscopist:  Dr. [name], MD Referred By:  Dr. Purvis Kilts, MD  Procedure Date: 09/04/2014  Procedure:   Colonoscopy  Indications: Patient is 74 year old Caucasian male with history of colonic adenomas and is here for surveillance colonoscopy.  Informed Consent:  The procedure and risks were reviewed with the patient and informed consent was obtained.  Medications:  Demerol 50 mg IV Versed 5 mg IV  Description of procedure:  After a digital rectal exam was performed, that colonoscope was advanced from the anus through the rectum and colon to the area of the cecum, ileocecal valve and appendiceal orifice. The cecum was deeply intubated. These structures were well-seen and photographed for the record. From the level of the cecum and ileocecal valve, the scope was slowly and cautiously withdrawn. The mucosal surfaces were carefully surveyed utilizing scope tip to flexion to facilitate fold flattening as needed. The scope was pulled down into the rectum where a thorough exam including retroflexion was performed.  Findings:   Prep excellent. Scattered diverticula throughout the colon but predominantly at ascending and sigmoid colon. Diverticulum at cecum noted to be partially inverted. Two small polyps or ablated via cold biopsy and submitted together. These are located at cecum and rectosigmoid junction. Findings mucosal pigmentation involving distal half of colon. Normal rectal mucosa. Large hemorrhoids below the dentate line.   Therapeutic/Diagnostic Maneuvers Performed:  See above  Complications:  None  Cecal Withdrawal Time:  12 minutes  Impression:  Examination performed to cecum. Pancolonic diverticulosis. Mild melanosis coli. Two small polyps ablated via cold biopsy and submitted together(cecum and sigmoid colon). Prominent external  hemorrhoids   Recommendations:  Standard instructions given. Patient will resume usual medications including warfarin. He should have INR checked in 7-10 days. I will contact patient with biopsy results and further recommendations.  REHMAN,NAJEEB U  09/04/2014 10:52 AM  CC: Dr. Hilma Favors, Betsy Coder, MD & Dr. Rayne Du ref. provider found

## 2014-09-04 NOTE — H&P (Signed)
Johnny Reynolds is an 74 y.o. male.   Chief Complaint: Patient is here for colonoscopy. HPI: Patient is 74 year old Caucasian male was history of colonic adenomas and is here for surveillance colonoscopy. He denies abdominal pain change in bowel habits or rectal bleeding. Last colonoscopy was in 2010. Patient has been off warfarin for 6 days. Family history is negative for CRC.   Past Medical History  Diagnosis Date  . Hypertension   . Back pain, chronic   . Arrhythmia   . High cholesterol   . AAA (abdominal aortic aneurysm)   . Atrial fibrillation   . Left shoulder pain 2014  . Sleep apnea Sept. 2008    Past Surgical History  Procedure Laterality Date  . Pacemaker insertion  2008  . Coronary stent placement  1996  . Cataract extraction  1983  . Eye surgery  1983    Cataract  . Cardiac catheterization    . Hernia repair Bilateral 2000    Family History  Problem Relation Age of Onset  . Peripheral vascular disease Mother   . Hypertension Mother   . Diabetes Mother   . Heart disease Mother   . Hyperlipidemia Mother   . Peripheral vascular disease Sister   . Cancer Sister   . Diabetes Sister   . Heart disease Sister     Stent- Heart Disease before age 32  . Hyperlipidemia Sister   . Hypertension Sister   . Peripheral vascular disease Brother   . Diabetes Brother   . Heart disease Brother     CHF and Heart Disease before age 6  . Hyperlipidemia Brother   . Hypertension Brother   . Stroke Father 6    TIA   Social History:  reports that he has never smoked. He has never used smokeless tobacco. He reports that he does not drink alcohol or use illicit drugs.  Allergies:  Allergies  Allergen Reactions  . Fexofenadine Hcl Palpitations    Allegra    Medications Prior to Admission  Medication Sig Dispense Refill  . aspirin 81 MG tablet Take 81 mg by mouth 2 (two) times daily.     Marland Kitchen atorvastatin (LIPITOR) 80 MG tablet TAKE ONE TABLET BY MOUTH DAILY. 90 tablet 3   . diltiazem (CARDIZEM CD) 240 MG 24 hr capsule Take 240 mg by mouth daily.     Marland Kitchen levothyroxine (SYNTHROID, LEVOTHROID) 50 MCG tablet Take by mouth daily before breakfast.     . losartan-hydrochlorothiazide (HYZAAR) 100-12.5 MG per tablet Take 0.05 tablets by mouth daily.    . saw palmetto 500 MG capsule Take 500 mg by mouth daily. 3 X's per week    . sotalol (BETAPACE) 160 MG tablet Take 1 tablet (160 mg total) by mouth 2 (two) times daily. 180 tablet 3  . warfarin (COUMADIN) 5 MG tablet Take coumadin 1 tablet daily except 1 1/2 tablets on Mondays and Thursdays (Patient taking differently: Take 5-7.5 mg by mouth daily at 6 PM. Takes 5 mg on every day except he takes 7.5 mg on Mondays and Thursdays) 120 tablet 3    No results found for this or any previous visit (from the past 48 hour(s)). No results found.  ROS  Temperature 97.8 F (36.6 C), temperature source Oral, resp. rate 18, height 5' 0.25" (1.53 m), weight 142 lb (64.411 kg), SpO2 96 %. Physical Exam  Constitutional: He appears well-developed and well-nourished.  HENT:  Mouth/Throat: Oropharynx is clear and moist.  Eyes: Conjunctivae are normal. No  scleral icterus.  Neck: No thyromegaly present.  Cardiovascular: Normal rate, regular rhythm and normal heart sounds.   No murmur heard. Has pacemaker in left pectoral area  Respiratory: Effort normal and breath sounds normal.  GI: Soft. He exhibits no distension and no mass. There is no tenderness.  Musculoskeletal: He exhibits no edema.  Lymphadenopathy:    He has no cervical adenopathy.  Neurological: He is alert.  Skin: Skin is warm and dry.     Assessment/Plan History of colonic adenomas. Surveillance colonoscopy.  [name] U 09/04/2014, 10:17 AM

## 2014-09-04 NOTE — Discharge Instructions (Signed)
Resume usual medications including warfarin. Get INR checked in 7-10 days. High fiber diet. No driving for 24 hours. Physician will call with biopsy results    .Colonoscopy, Care After These instructions give you information on caring for yourself after your procedure. Your doctor may also give you more specific instructions. Call your doctor if you have any problems or questions after your procedure. HOME CARE  Do not drive for 24 hours.  Do not sign important papers or use machinery for 24 hours.  You may shower.  You may go back to your usual activities, but go slower for the first 24 hours.  Take rest breaks often during the first 24 hours.  Walk around or use warm packs on your belly (abdomen) if you have belly cramping or gas.  Drink enough fluids to keep your pee (urine) clear or pale yellow.  Resume your normal diet. Avoid heavy or fried foods.  Avoid drinking alcohol for 24 hours or as told by your doctor.  Only take medicines as told by your doctor. If a tissue sample (biopsy) was taken during the procedure:   Do not take aspirin or blood thinners for 7 days, or as told by your doctor.  Do not drink alcohol for 7 days, or as told by your doctor.  Eat soft foods for the first 24 hours. GET HELP IF: You still have a small amount of blood in your poop (stool) 2-3 days after the procedure. GET HELP RIGHT AWAY IF:  You have more than a small amount of blood in your poop.  You see clumps of tissue (blood clots) in your poop.  Your belly is puffy (swollen).  You feel sick to your stomach (nauseous) or throw up (vomit).  You have a fever.  You have belly pain that gets worse and medicine does not help. MAKE SURE YOU:  Understand these instructions.  Will watch your condition.  Will get help right away if you are not doing well or get worse. Document Released: 09/11/2010 Document Revised: 08/14/2013 Document Reviewed: 04/16/2013 [facility] Patient  Information 2015 [place], [place]. This information is not intended to replace advice given to you by your health care provider. Make sure you discuss any questions you have with your health care provider.   High-Fiber Diet Fiber is found in fruits, vegetables, and grains. A high-fiber diet encourages the addition of more whole grains, legumes, fruits, and vegetables in your diet. The recommended amount of fiber for adult males is 38 g per day. For adult females, it is 25 g per day. Pregnant and lactating women should get 28 g of fiber per day. If you have a digestive or bowel problem, ask your caregiver for advice before adding high-fiber foods to your diet. Eat a variety of high-fiber foods instead of only a select few type of foods.  PURPOSE  To increase stool bulk.  To make bowel movements more regular to prevent constipation.  To lower cholesterol.  To prevent overeating. WHEN IS THIS DIET USED?  It may be used if you have constipation and hemorrhoids.  It may be used if you have uncomplicated diverticulosis (intestine condition) and irritable bowel syndrome.  It may be used if you need help with weight management.  It may be used if you want to add it to your diet as a protective measure against atherosclerosis, diabetes, and cancer. SOURCES OF FIBER  Whole-grain breads and cereals.  Fruits, such as apples, oranges, bananas, berries, prunes, and pears.  Vegetables, such  as green peas, carrots, sweet potatoes, beets, broccoli, cabbage, spinach, and artichokes.  Legumes, such split peas, soy, lentils.  Almonds. FIBER CONTENT IN FOODS Starches and Grains / Dietary Fiber (g)  Cheerios, 1 cup / 3 g  Corn Flakes cereal, 1 cup / 0.7 g  Rice crispy treat cereal, 1 cup / 0.3 g  Instant oatmeal (cooked),  cup / 2 g  Frosted wheat cereal, 1 cup / 5.1 g  Brown, long-grain rice (cooked), 1 cup / 3.5 g  White, long-grain rice (cooked), 1 cup / 0.6 g  Enriched macaroni  (cooked), 1 cup / 2.5 g Legumes / Dietary Fiber (g)  Baked beans (canned, plain, or vegetarian),  cup / 5.2 g  Kidney beans (canned),  cup / 6.8 g  Pinto beans (cooked),  cup / 5.5 g Breads and Crackers / Dietary Fiber (g)  Plain or honey graham crackers, 2 squares / 0.7 g  Saltine crackers, 3 squares / 0.3 g  Plain, salted pretzels, 10 pieces / 1.8 g  Whole-wheat bread, 1 slice / 1.9 g  White bread, 1 slice / 0.7 g  Raisin bread, 1 slice / 1.2 g  Plain bagel, 3 oz / 2 g  Flour tortilla, 1 oz / 0.9 g  Corn tortilla, 1 small / 1.5 g  Hamburger or hotdog bun, 1 small / 0.9 g Fruits / Dietary Fiber (g)  Apple with skin, 1 medium / 4.4 g  Sweetened applesauce,  cup / 1.5 g  Banana,  medium / 1.5 g  Grapes, 10 grapes / 0.4 g  Orange, 1 small / 2.3 g  Raisin, 1.5 oz / 1.6 g  Melon, 1 cup / 1.4 g Vegetables / Dietary Fiber (g)  Green beans (canned),  cup / 1.3 g  Carrots (cooked),  cup / 2.3 g  Broccoli (cooked),  cup / 2.8 g  Peas (cooked),  cup / 4.4 g  Mashed potatoes,  cup / 1.6 g  Lettuce, 1 cup / 0.5 g  Corn (canned),  cup / 1.6 g  Tomato,  cup / 1.1 g Document Released: 08/09/2005 Document Revised: 02/08/2012 Document Reviewed: 11/11/2011 [employer] Patient Information 2015 [place], [place]. This information is not intended to replace advice given to you by your health care provider. Make sure you discuss any questions you have with your health care provider.   Colon Polyps Polyps are lumps of extra tissue growing inside the body. Polyps can grow in the large intestine (colon). Most colon polyps are noncancerous (benign). However, some colon polyps can become cancerous over time. Polyps that are larger than a pea may be harmful. To be safe, caregivers remove and test all polyps. CAUSES  Polyps form when mutations in the genes cause your cells to grow and divide even though no more tissue is needed. RISK FACTORS There are a number of risk  factors that can increase your chances of getting colon polyps. They include:  Being older than 50 years.  Family history of colon polyps or colon cancer.  Long-term colon diseases, such as colitis or Crohn disease.  Being overweight.  Smoking.  Being inactive.  Drinking too much alcohol. SYMPTOMS  Most small polyps do not cause symptoms. If symptoms are present, they may include:  Blood in the stool. The stool may look dark red or black.  Constipation or diarrhea that lasts longer than 1 week. DIAGNOSIS People often do not know they have polyps until their caregiver finds them during a regular checkup. Your caregiver  can use 4 tests to check for polyps:  Digital rectal exam. The caregiver wears gloves and feels inside the rectum. This test would find polyps only in the rectum.  Barium enema. The caregiver puts a liquid called barium into your rectum before taking X-rays of your colon. Barium makes your colon look white. Polyps are dark, so they are easy to see in the X-ray pictures.  Sigmoidoscopy. A thin, flexible tube (sigmoidoscope) is placed into your rectum. The sigmoidoscope has a light and tiny camera in it. The caregiver uses the sigmoidoscope to look at the last third of your colon.  Colonoscopy. This test is like sigmoidoscopy, but the caregiver looks at the entire colon. This is the most common method for finding and removing polyps. TREATMENT  Any polyps will be removed during a sigmoidoscopy or colonoscopy. The polyps are then tested for cancer. PREVENTION  To help lower your risk of getting more colon polyps:  Eat plenty of fruits and vegetables. Avoid eating fatty foods.  Do not smoke.  Avoid drinking alcohol.  Exercise every day.  Lose weight if recommended by your caregiver.  Eat plenty of calcium and folate. Foods that are rich in calcium include milk, cheese, and broccoli. Foods that are rich in folate include chickpeas, kidney beans, and  spinach. HOME CARE INSTRUCTIONS Keep all follow-up appointments as directed by your caregiver. You may need periodic exams to check for polyps. SEEK MEDICAL CARE IF: You notice bleeding during a bowel movement. Document Released: 05/05/2004 Document Revised: 11/01/2011 Document Reviewed: 10/19/2011 [facility] Patient Information 2015 [place], [place]. This information is not intended to replace advice given to you by your health care provider. Make sure you discuss any questions you have with your health care provider.

## 2014-09-05 ENCOUNTER — Encounter (HOSPITAL_COMMUNITY): Payer: Self-pay | Admitting: Internal Medicine

## 2014-09-06 ENCOUNTER — Other Ambulatory Visit: Payer: Self-pay | Admitting: Physician Assistant

## 2014-09-09 ENCOUNTER — Encounter (INDEPENDENT_AMBULATORY_CARE_PROVIDER_SITE_OTHER): Payer: Self-pay | Admitting: *Deleted

## 2014-09-09 ENCOUNTER — Telehealth: Payer: Self-pay | Admitting: *Deleted

## 2014-09-09 MED ORDER — WARFARIN SODIUM 5 MG PO TABS: ORAL_TABLET | ORAL | Status: DC

## 2014-09-09 NOTE — Telephone Encounter (Signed)
Fax needing warfarin 5 mg called in

## 2014-10-02 ENCOUNTER — Ambulatory Visit (INDEPENDENT_AMBULATORY_CARE_PROVIDER_SITE_OTHER): Payer: Medicare Other | Admitting: *Deleted

## 2014-10-02 DIAGNOSIS — I4891 Unspecified atrial fibrillation: Secondary | ICD-10-CM

## 2014-10-02 DIAGNOSIS — Z5181 Encounter for therapeutic drug level monitoring: Secondary | ICD-10-CM

## 2014-10-02 DIAGNOSIS — I480 Paroxysmal atrial fibrillation: Secondary | ICD-10-CM

## 2014-10-02 DIAGNOSIS — Z7901 Long term (current) use of anticoagulants: Secondary | ICD-10-CM

## 2014-10-02 LAB — POCT INR: INR: 2.5

## 2014-10-14 ENCOUNTER — Ambulatory Visit (INDEPENDENT_AMBULATORY_CARE_PROVIDER_SITE_OTHER): Payer: Medicare Other | Admitting: *Deleted

## 2014-10-14 DIAGNOSIS — R001 Bradycardia, unspecified: Secondary | ICD-10-CM

## 2014-10-14 NOTE — Progress Notes (Signed)
Remote pacemaker transmission.   

## 2014-10-17 LAB — MDC_IDC_ENUM_SESS_TYPE_REMOTE
Battery Impedance: 1846 Ohm
Battery Remaining Longevity: 28 mo
Brady Statistic AP VP Percent: 0 %
Brady Statistic AP VS Percent: 100 %
Brady Statistic AS VS Percent: 0 %
Date Time Interrogation Session: 20160222160530
Lead Channel Impedance Value: 610 Ohm
Lead Channel Pacing Threshold Amplitude: 0.625 V
Lead Channel Sensing Intrinsic Amplitude: 16 mV
Lead Channel Setting Pacing Amplitude: 1.5 V
Lead Channel Setting Pacing Amplitude: 2 V
Lead Channel Setting Sensing Sensitivity: 5.6 mV
MDC IDC MSMT BATTERY VOLTAGE: 2.72 V
MDC IDC MSMT LEADCHNL RA IMPEDANCE VALUE: 574 Ohm
MDC IDC MSMT LEADCHNL RA PACING THRESHOLD AMPLITUDE: 0.625 V
MDC IDC MSMT LEADCHNL RA PACING THRESHOLD PULSEWIDTH: 0.4 ms
MDC IDC MSMT LEADCHNL RV PACING THRESHOLD PULSEWIDTH: 0.4 ms
MDC IDC SET LEADCHNL RV PACING PULSEWIDTH: 0.4 ms
MDC IDC STAT BRADY AS VP PERCENT: 0 %

## 2014-10-25 ENCOUNTER — Encounter: Payer: Self-pay | Admitting: Cardiology

## 2014-10-30 ENCOUNTER — Encounter: Payer: Self-pay | Admitting: Internal Medicine

## 2014-11-01 ENCOUNTER — Other Ambulatory Visit (HOSPITAL_COMMUNITY): Payer: Medicare Other

## 2014-11-01 ENCOUNTER — Ambulatory Visit: Payer: Medicare Other | Admitting: Family

## 2014-11-08 ENCOUNTER — Encounter: Payer: Self-pay | Admitting: Family

## 2014-11-11 ENCOUNTER — Ambulatory Visit (INDEPENDENT_AMBULATORY_CARE_PROVIDER_SITE_OTHER): Payer: Medicare Other | Admitting: Family

## 2014-11-11 ENCOUNTER — Encounter: Payer: Self-pay | Admitting: Family

## 2014-11-11 ENCOUNTER — Ambulatory Visit (HOSPITAL_COMMUNITY)
Admission: RE | Admit: 2014-11-11 | Discharge: 2014-11-11 | Disposition: A | Discharge status: Home / Self Care | Payer: Medicare Other | Source: Ambulatory Visit | Attending: Family | Admitting: Family

## 2014-11-11 ENCOUNTER — Other Ambulatory Visit: Payer: Self-pay | Admitting: Cardiology

## 2014-11-11 VITALS — BP 147/84 | HR 77 | Resp 16 | Ht 66.0 in | Wt 145.0 lb

## 2014-11-11 DIAGNOSIS — I714 Abdominal aortic aneurysm, without rupture, unspecified: Secondary | ICD-10-CM

## 2014-11-11 DIAGNOSIS — Z48812 Encounter for surgical aftercare following surgery on the circulatory system: Secondary | ICD-10-CM

## 2014-11-11 NOTE — Progress Notes (Signed)
[facility] [place]  Established Abdominal Aortic Aneurysm  History of Present Illness  Johnny Reynolds is a 74 y.o. ([birth year]) male patient of Dr. Donnetta Hutching followed for known AAA who presents with chief complaint: follow up for AAA.  Previous studies demonstrate an AAA, measuring 3.10 cm.  The patient does have c-spine issues, but no lumbar spine issues, denies abdominal pain. The patient is not a smoker. The patient denies claudication in legs with walking. The patient denies history of stroke or TIA symptoms, denies MI, but did have angina in 1996, stent placed in LAD.  He takes coumadin for atrial fibrillation.  Pt Diabetic: Yes, stateslast AIC was 5.6, diagnosed in 2005, he was taken off his metformin per pt Pt smoker: non-smoker  Past Medical History  Diagnosis Date  . Hypertension   . Back pain, chronic   . Arrhythmia   . High cholesterol   . AAA (abdominal aortic aneurysm)   . Atrial fibrillation   . Left shoulder pain 2014  . Sleep apnea Sept. 2008   Past Surgical History  Procedure Laterality Date  . Pacemaker insertion  2008  . Coronary stent placement  1996  . Cataract extraction  1983  . Eye surgery  1983    Cataract  . Cardiac catheterization    . Hernia repair Bilateral 2000  . Colonoscopy N/A 09/04/2014    Procedure: COLONOSCOPY;  Surgeon: [name], MD;  Location: [facility];  Service: Endoscopy;  Laterality: N/A;  1030   Social History History   Social History  . Marital Status: Married    Spouse Name: N/A  . Number of Children: N/A  . Years of Education: N/A   Occupational History  . Not on file.   Social History Main Topics  . Smoking status: Never Smoker   . Smokeless tobacco: Never Used  . Alcohol Use: No  . Drug Use: No  . Sexual Activity: Not on file   Other Topics Concern  . Not on file   Social History Narrative   Family History Family History  Problem Relation Age of Onset  . Peripheral  vascular disease Mother   . Hypertension Mother   . Diabetes Mother   . Heart disease Mother   . Hyperlipidemia Mother   . Peripheral vascular disease Sister   . Cancer Sister   . Diabetes Sister   . Heart disease Sister     Stent- Heart Disease before age 43  . Hyperlipidemia Sister   . Hypertension Sister   . Peripheral vascular disease Brother   . Diabetes Brother   . Heart disease Brother     CHF and Heart Disease before age 12  . Hyperlipidemia Brother   . Hypertension Brother   . Stroke Father 35    TIA    Current Outpatient Prescriptions on File Prior to Visit  Medication Sig Dispense Refill  . aspirin 81 MG tablet Take 81 mg by mouth 2 (two) times daily.     Marland Kitchen atorvastatin (LIPITOR) 80 MG tablet TAKE ONE TABLET BY MOUTH DAILY. 90 tablet 3  . diltiazem (CARDIZEM CD) 240 MG 24 hr capsule Take 240 mg by mouth daily.     Marland Kitchen levothyroxine (SYNTHROID, LEVOTHROID) 50 MCG tablet Take by mouth daily before breakfast.     . losartan-hydrochlorothiazide (HYZAAR) 100-12.5 MG per tablet Take 0.5 tablets by mouth daily.     . saw palmetto 500 MG capsule Take 500 mg by mouth daily. 3 X's  per week    . sotalol (BETAPACE) 160 MG tablet Take 1 tablet (160 mg total) by mouth 2 (two) times daily. 180 tablet 3  . warfarin (COUMADIN) 5 MG tablet Take coumadin 1 tablet daily except 1 1/2 tablets on Mondays and Thursdays 120 tablet 3   No current facility-administered medications on file prior to visit.   Allergies  Allergen Reactions  . Fexofenadine Hcl Palpitations    Allegra    ROS: See HPI for pertinent positives and negatives.  Physical Examination  Filed Vitals:   11/11/14 0914  BP: 147/84  Pulse: 77  Resp: 16  Height: 5\' 6"  (1.676 m)  Weight: 145 lb (65.772 kg)  SpO2: 100%   Body mass index is 23.41 kg/(m^2).   General: A&O x 3, WD, .  Pulmonary: Sym exp, good air movt, CTAB, no rales, rhonchi, or wheezing.  Cardiac: RRR, Nl S1, S2, no detected murmur.   Carotid  Bruits Left Right   Negative Negative  Aorta is not palpable Radial pulses are 2+ palpable and =   VASCULAR EXAM:     LE Pulses LEFT RIGHT   FEMORAL  palpable  palpable    POPLITEAL not palpable  not palpable   POSTERIOR TIBIAL  palpable   palpable    DORSALIS PEDIS  ANTERIOR TIBIAL palpable  palpable     Gastrointestinal: soft, NTND, -G/R, - HSM, - palpable masses, - CVAT B.  Musculoskeletal: M/S 5/5 throughout, Extremities without ischemic changes.  Neurologic: CN 2-12 intact, Pain and light touch intact in extremities are intact, Motor exam as listed above.        Non-Invasive Vascular Imaging  AAA Duplex (11/11/2014) ABDOMINAL AORTA DUPLEX EVALUATION    INDICATION: Follow-up abdominal aortic aneurysm     PREVIOUS INTERVENTION(S):     DUPLEX EXAM:     LOCATION DIAMETER AP (cm) DIAMETER TRANSVERSE (cm) VELOCITIES (cm/sec)  Aorta Proximal 1.96 2.08 74  Aorta Mid 2.81 2.81 36  Aorta Distal 1.58 1.55 43  Right Common Iliac Artery 0.97 1.01 81  Left Common Iliac Artery 1.21 1.21 71    Previous max aortic diameter:  2.84cm x 2.92cm Date: 10/26/2013  ADDITIONAL FINDINGS:     IMPRESSION: Stable saccular abdominal aortic aneurysm measuring 2.81cm x 2.81cm on today's exam.    Compared to the previous exam:  No significant change compared to prior exam.      Medical Decision Making  The patient is a 74 y.o. male who presents with asymptomatic AAA with no increase in size. 2012 the largest AAA measurement was 2.83 cm. Pt expressed the desire to stretch his surveillance from yearly to 2 years; this is reasonable considering his AAA has not grown in 4 years, his AAA remains small at 2.81 cm,  and his risk factors for aneurysmal growth are not present or  minimal.   Based on this patient's exam and diagnostic studies, the patient will follow up in 2 years  with the following studies: AAA Duplex.  Consideration for repair of AAA would be made when the size approaches 4.8 or 5.0 cm, growth > 1 cm/yr, and symptomatic status.  I emphasized the importance of maximal medical management including strict control of blood pressure, blood glucose, and lipid levels, antiplatelet agents, obtaining regular exercise, and continued cessation of smoking.   The patient was given information about AAA including signs, symptoms, treatment, and how to minimize the risk of enlargement and rupture of aneurysms.    The patient was advised to call  [phone] should the patient experience sudden onset abdominal or back pain.   Thank you for allowing [place] to participate in this patient's care.  Clemon Chambers, RN, MSN, FNP-C [facility] Office: [facility] Physician: Trula Slade  11/11/2014, 9:15 AM

## 2014-11-11 NOTE — Patient Instructions (Signed)
Abdominal Aortic Aneurysm An aneurysm is a weakened or damaged part of an artery wall that bulges from the normal force of blood pumping through the body. An abdominal aortic aneurysm is an aneurysm that occurs in the lower part of the aorta, the main artery of the body.  The major concern with an abdominal aortic aneurysm is that it can enlarge and burst (rupture) or blood can flow between the layers of the wall of the aorta through a tear (aorticdissection). Both of these conditions can cause bleeding inside the body and can be life threatening unless diagnosed and treated promptly. CAUSES  The exact cause of an abdominal aortic aneurysm is unknown. Some contributing factors are:   A hardening of the arteries caused by the buildup of fat and other substances in the lining of a blood vessel (arteriosclerosis).  Inflammation of the walls of an artery (arteritis).   Connective tissue diseases, such as Marfan syndrome.   Abdominal trauma.   An infection, such as syphilis or staphylococcus, in the wall of the aorta (infectious aortitis) caused by bacteria. RISK FACTORS  Risk factors that contribute to an abdominal aortic aneurysm may include:  Age older than 60 years.   High blood pressure (hypertension).  Male gender.  Ethnicity (white race).  Obesity.  Family history of aneurysm (first degree relatives only).  Tobacco use. PREVENTION  The following healthy lifestyle habits may help decrease your risk of abdominal aortic aneurysm:  Quitting smoking. Smoking can raise your blood pressure and cause arteriosclerosis.  Limiting or avoiding alcohol.  Keeping your blood pressure, blood sugar level, and cholesterol levels within normal limits.  Decreasing your salt intake. In somepeople, too much salt can raise blood pressure and increase your risk of abdominal aortic aneurysm.  Eating a diet low in saturated fats and cholesterol.  Increasing your fiber intake by including  whole grains, vegetables, and fruits in your diet. Eating these foods may help lower blood pressure.  Maintaining a healthy weight.  Staying physically active and exercising regularly. SYMPTOMS  The symptoms of abdominal aortic aneurysm may vary depending on the size and rate of growth of the aneurysm.Most grow slowly and do not have any symptoms. When symptoms do occur, they may include:  Pain (abdomen, side, lower back, or groin). The pain may vary in intensity. A sudden onset of severe pain may indicate that the aneurysm has ruptured.  Feeling full after eating only small amounts of food.  Nausea or vomiting or both.  Feeling a pulsating lump in the abdomen.  Feeling faint or passing out. DIAGNOSIS  Since most unruptured abdominal aortic aneurysms have no symptoms, they are often discovered during diagnostic exams for other conditions. An aneurysm may be found during the following procedures:  Ultrasonography (A one-time screening for abdominal aortic aneurysm by ultrasonography is also recommended for all men aged 65-75 years who have ever smoked).  X-ray exams.  A computed tomography (CT).  Magnetic resonance imaging (MRI).  Angiography or arteriography. TREATMENT  Treatment of an abdominal aortic aneurysm depends on the size of your aneurysm, your age, and risk factors for rupture. Medication to control blood pressure and pain may be used to manage aneurysms smaller than 6 cm. Regular monitoring for enlargement may be recommended by your caregiver if:  The aneurysm is 3-4 cm in size (an annual ultrasonography may be recommended).  The aneurysm is 4-4.5 cm in size (an ultrasonography every 6 months may be recommended).  The aneurysm is larger than 4.5 cm in   size (your caregiver may ask that you be examined by a vascular surgeon). If your aneurysm is larger than 6 cm, surgical repair may be recommended. There are two main methods for repair of an aneurysm:   Endovascular  repair (a minimally invasive surgery). This is done most often.  Open repair. This method is used if an endovascular repair is not possible. Document Released: 05/19/2005 Document Revised: 12/04/2012 Document Reviewed: 09/08/2012 Exit[employer] Patient Information 2015 [employer]. This information is not intended to replace advice given to you by your health care provider. Make sure you discuss any questions you have with your health care provider.  

## 2014-11-13 ENCOUNTER — Ambulatory Visit (INDEPENDENT_AMBULATORY_CARE_PROVIDER_SITE_OTHER): Payer: Medicare Other | Admitting: *Deleted

## 2014-11-13 DIAGNOSIS — I4891 Unspecified atrial fibrillation: Secondary | ICD-10-CM | POA: Diagnosis not present

## 2014-11-13 DIAGNOSIS — Z7901 Long term (current) use of anticoagulants: Secondary | ICD-10-CM

## 2014-11-13 DIAGNOSIS — I480 Paroxysmal atrial fibrillation: Secondary | ICD-10-CM

## 2014-11-13 DIAGNOSIS — Z5181 Encounter for therapeutic drug level monitoring: Secondary | ICD-10-CM

## 2014-11-13 LAB — POCT INR: INR: 2.3

## 2014-12-25 ENCOUNTER — Ambulatory Visit (INDEPENDENT_AMBULATORY_CARE_PROVIDER_SITE_OTHER): Payer: Medicare Other | Admitting: *Deleted

## 2014-12-25 DIAGNOSIS — Z5181 Encounter for therapeutic drug level monitoring: Secondary | ICD-10-CM

## 2014-12-25 DIAGNOSIS — Z7901 Long term (current) use of anticoagulants: Secondary | ICD-10-CM

## 2014-12-25 DIAGNOSIS — I4891 Unspecified atrial fibrillation: Secondary | ICD-10-CM | POA: Diagnosis not present

## 2014-12-25 LAB — POCT INR: INR: 2.6

## 2015-01-15 ENCOUNTER — Encounter: Payer: Medicare Other | Admitting: *Deleted

## 2015-01-15 ENCOUNTER — Telehealth: Payer: Self-pay | Admitting: Cardiology

## 2015-01-15 NOTE — Telephone Encounter (Signed)
LMOVM reminding pt to send remote transmission.   

## 2015-01-16 ENCOUNTER — Encounter: Payer: Self-pay | Admitting: Cardiology

## 2015-01-23 ENCOUNTER — Ambulatory Visit (INDEPENDENT_AMBULATORY_CARE_PROVIDER_SITE_OTHER): Payer: Medicare Other | Admitting: *Deleted

## 2015-01-23 ENCOUNTER — Encounter: Payer: Self-pay | Admitting: Internal Medicine

## 2015-01-23 DIAGNOSIS — R001 Bradycardia, unspecified: Secondary | ICD-10-CM | POA: Diagnosis not present

## 2015-01-24 NOTE — Progress Notes (Signed)
Remote pacemaker transmission.   

## 2015-01-28 LAB — CUP PACEART REMOTE DEVICE CHECK
Battery Impedance: 1867 Ohm
Battery Remaining Longevity: 28 mo
Battery Voltage: 2.72 V
Brady Statistic AP VS Percent: 100 %
Date Time Interrogation Session: 20160602170004
Lead Channel Impedance Value: 620 Ohm
Lead Channel Impedance Value: 643 Ohm
Lead Channel Pacing Threshold Amplitude: 0.75 V
Lead Channel Pacing Threshold Pulse Width: 0.4 ms
Lead Channel Sensing Intrinsic Amplitude: 16 mV
Lead Channel Setting Pacing Amplitude: 2 V
Lead Channel Setting Pacing Pulse Width: 0.4 ms
MDC IDC MSMT LEADCHNL RA PACING THRESHOLD AMPLITUDE: 0.75 V
MDC IDC MSMT LEADCHNL RV PACING THRESHOLD PULSEWIDTH: 0.4 ms
MDC IDC SET LEADCHNL RA PACING AMPLITUDE: 1.5 V
MDC IDC SET LEADCHNL RV SENSING SENSITIVITY: 5.6 mV
MDC IDC STAT BRADY AP VP PERCENT: 0 %
MDC IDC STAT BRADY AS VP PERCENT: 0 %
MDC IDC STAT BRADY AS VS PERCENT: 0 %

## 2015-02-10 ENCOUNTER — Ambulatory Visit (INDEPENDENT_AMBULATORY_CARE_PROVIDER_SITE_OTHER): Payer: Medicare Other | Admitting: *Deleted

## 2015-02-10 ENCOUNTER — Encounter: Payer: Self-pay | Admitting: Cardiology

## 2015-02-10 DIAGNOSIS — Z7901 Long term (current) use of anticoagulants: Secondary | ICD-10-CM

## 2015-02-10 DIAGNOSIS — I4891 Unspecified atrial fibrillation: Secondary | ICD-10-CM | POA: Diagnosis not present

## 2015-02-10 DIAGNOSIS — Z5181 Encounter for therapeutic drug level monitoring: Secondary | ICD-10-CM | POA: Diagnosis not present

## 2015-02-10 LAB — POCT INR: INR: 2.3

## 2015-02-19 ENCOUNTER — Ambulatory Visit (INDEPENDENT_AMBULATORY_CARE_PROVIDER_SITE_OTHER): Payer: Medicare Other | Admitting: Cardiology

## 2015-02-19 ENCOUNTER — Encounter: Payer: Self-pay | Admitting: Cardiology

## 2015-02-19 VITALS — BP 142/72 | HR 72 | Ht 65.0 in | Wt 148.0 lb

## 2015-02-19 DIAGNOSIS — I10 Essential (primary) hypertension: Secondary | ICD-10-CM

## 2015-02-19 DIAGNOSIS — E785 Hyperlipidemia, unspecified: Secondary | ICD-10-CM

## 2015-02-19 DIAGNOSIS — I714 Abdominal aortic aneurysm, without rupture, unspecified: Secondary | ICD-10-CM

## 2015-02-19 DIAGNOSIS — I4891 Unspecified atrial fibrillation: Secondary | ICD-10-CM | POA: Diagnosis not present

## 2015-02-19 DIAGNOSIS — I495 Sick sinus syndrome: Secondary | ICD-10-CM

## 2015-02-19 DIAGNOSIS — I2510 Atherosclerotic heart disease of native coronary artery without angina pectoris: Secondary | ICD-10-CM

## 2015-02-19 MED ORDER — LOSARTAN POTASSIUM 50 MG PO TABS: 50.0000 mg | ORAL_TABLET | Freq: Every day | ORAL | Status: DC

## 2015-02-19 NOTE — Progress Notes (Signed)
Clinical Summary Johnny Reynolds is a 74 y.o.male seen today for follow up of the following medical problems.   1. AAA  - followed by vascular, last [place] 01/2015 stable at 2.8 x 2.8 cm   2. HTN  - checks at home regularly, typically 130/70s. Reports bp often higher in office.  - compliant with meds - recent intentional 18 lbs weight loss, was able to decrease his hyzaar to 1/2 tablet daily  3. Parox Afib  -on sotalol 160 mg bid, on approx since 2010. Also dilt 240 mg qday  - no palpitations, no SOB, no DOE  - compliant w/ coumadin, no issues w/ bleeding.    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  - normal pacemaker check 01/2015  5. CAD  -remote history per old clinic notes, prior angioplasty in 1996 and [facility] at [facility]  - no recent chest pain   6. Hyperlipidemia  - compliant with lipitor - reports upcoming physical with labs July     Past Medical History  Diagnosis Date  . Hypertension   . Back pain, chronic   . Arrhythmia   . High cholesterol   . AAA (abdominal aortic aneurysm)   . Atrial fibrillation   . Left shoulder pain 2014  . Sleep apnea Sept. 2008     Allergies  Allergen Reactions  . Fexofenadine Hcl Palpitations    Allegra     Current Outpatient Prescriptions  Medication Sig Dispense Refill  . aspirin 81 MG tablet Take 81 mg by mouth 2 (two) times daily.     Marland Kitchen atorvastatin (LIPITOR) 80 MG tablet TAKE ONE TABLET BY MOUTH DAILY. 90 tablet 3  . diltiazem (CARDIZEM CD) 240 MG 24 hr capsule Take 240 mg by mouth daily.     Marland Kitchen levothyroxine (SYNTHROID, LEVOTHROID) 50 MCG tablet Take by mouth daily before breakfast.     . losartan-hydrochlorothiazide (HYZAAR) 100-12.5 MG per tablet Take 0.5 tablets by mouth daily.     . saw palmetto 500 MG capsule Take 500 mg by mouth daily. 3 X's per week    . sotalol (BETAPACE) 160 MG tablet Take 1 tablet (160 mg total) by  mouth 2 (two) times daily. 180 tablet 3  . warfarin (COUMADIN) 5 MG tablet Take coumadin 1 tablet daily except 1 1/2 tablets on Mondays and Thursdays 120 tablet 3   No current facility-administered medications for this visit.     Past Surgical History  Procedure Laterality Date  . Pacemaker insertion  2008  . Coronary stent placement  1996  . Cataract extraction  1983  . Eye surgery  1983    Cataract  . Cardiac catheterization    . Hernia repair Bilateral 2000  . Colonoscopy N/A 09/04/2014    Procedure: COLONOSCOPY;  Surgeon: [name], MD;  [facility];  Service: Endoscopy;  Laterality: N/A;  1030     Allergies  Allergen Reactions  . Fexofenadine Hcl Palpitations    Allegra      Family History  Problem Relation Age of Onset  . Peripheral vascular disease Mother   . Hypertension Mother   . Diabetes Mother   . Heart disease Mother   . Hyperlipidemia Mother   . Peripheral vascular disease Sister   . Cancer Sister   . Diabetes Sister   . Heart disease Sister     Stent- Heart Disease before age 34  . Hyperlipidemia  Sister   . Hypertension Sister   . Peripheral vascular disease Brother   . Diabetes Brother   . Heart disease Brother     CHF and Heart Disease before age 55  . Hyperlipidemia Brother   . Hypertension Brother   . Stroke Father 12    TIA     Social History Johnny Reynolds reports that he has never smoked. He has never used smokeless tobacco. Johnny Reynolds reports that he does not drink alcohol.   Review of Systems CONSTITUTIONAL: No weight loss, fever, chills, weakness or fatigue.  HEENT: Eyes: No visual loss, blurred vision, double vision or yellow sclerae.No hearing loss, sneezing, congestion, runny nose or sore throat.  SKIN: No rash or itching.  CARDIOVASCULAR: per HPI RESPIRATORY: No shortness of breath, cough or sputum.  GASTROINTESTINAL: No anorexia, nausea, vomiting or diarrhea. No abdominal pain or blood.  GENITOURINARY: No  burning on urination, no polyuria NEUROLOGICAL: No headache, dizziness, syncope, paralysis, ataxia, numbness or tingling in the extremities. No change in bowel or bladder control.  MUSCULOSKELETAL: No muscle, back pain, joint pain or stiffness.  LYMPHATICS: No enlarged nodes. No history of splenectomy.  PSYCHIATRIC: No history of depression or anxiety.  ENDOCRINOLOGIC: No reports of sweating, cold or heat intolerance. No polyuria or polydipsia.  Marland Kitchen   Physical Examination Filed Vitals:   02/19/15 1512  BP: 142/72  Pulse: 72   Filed Vitals:   02/19/15 1512  Height: 5\' 5"  (1.651 m)  Weight: 148 lb (67.132 kg)    Gen: resting comfortably, no acute distress HEENT: no scleral icterus, pupils equal round and reactive, no palptable cervical adenopathy,  CV: RRR, no m/r/g, no JVD Resp: Clear to auscultation bilaterally GI: abdomen is soft, non-tender, non-distended, normal bowel sounds, no hepatosplenomegaly MSK: extremities are warm, no edema.  Skin: warm, no rash Neuro:  no focal deficits Psych: appropriate affect   Diagnostic Studies Echo 03/2010 [facility]: LVEF 50-55%, mild LVH, LAE   03/2010 [facility]: no ischemia, LVEF 50%.    02/06/14 EKG Atrial paced, QTc 490    Assessment and Plan   1. AAA: stable by most recent US, continue to follow w/ vascular.   2. CAD  - no current symptoms, will continue risk factor modfication and secondary prevention.   3. Parox afib  - no symptoms, continue current therapy. Acceptable QTc on sotalol - discussed NOACS, he is happy on coumadin, will continue  4. Tachy-brady syndrome  - no current symptoms, normal pacer check recently  5. HL: - f/u upcoming labs from pcp - continue high dose statin in setting of CAD  6. HTN - at goal. Significant weight loss has improved his bp, will stop hyzaar and start losartan 50mg  daily alone to simplify his regimen.   F/u 6 months     Arnoldo Lenis, M.D

## 2015-02-19 NOTE — Patient Instructions (Addendum)
Your physician wants you to follow-up in:  [place] DR. BRANCH You will receive a reminder letter in the mail two months in advance. If you don't receive a letter, please call our office to schedule the follow-up appointment.  STOP HYZARR   START LOSARTAN 5O MG 1  TABLET DAILY   Thanks for choosing [facility]!!!

## 2015-02-25 ENCOUNTER — Encounter: Payer: Self-pay | Admitting: *Deleted

## 2015-03-04 ENCOUNTER — Other Ambulatory Visit: Payer: Self-pay | Admitting: Cardiology

## 2015-03-07 ENCOUNTER — Encounter: Payer: Self-pay | Admitting: Cardiovascular Disease

## 2015-03-07 ENCOUNTER — Encounter: Payer: Self-pay | Admitting: Cardiology

## 2015-03-26 ENCOUNTER — Ambulatory Visit (INDEPENDENT_AMBULATORY_CARE_PROVIDER_SITE_OTHER): Payer: Medicare Other | Admitting: *Deleted

## 2015-03-26 DIAGNOSIS — Z5181 Encounter for therapeutic drug level monitoring: Secondary | ICD-10-CM | POA: Diagnosis not present

## 2015-03-26 DIAGNOSIS — Z7901 Long term (current) use of anticoagulants: Secondary | ICD-10-CM

## 2015-03-26 DIAGNOSIS — I4891 Unspecified atrial fibrillation: Secondary | ICD-10-CM | POA: Diagnosis not present

## 2015-03-26 LAB — POCT INR
INR: 2.3
INR: 2.3

## 2015-04-29 ENCOUNTER — Telehealth: Payer: Self-pay | Admitting: Cardiology

## 2015-04-29 ENCOUNTER — Ambulatory Visit (INDEPENDENT_AMBULATORY_CARE_PROVIDER_SITE_OTHER): Payer: Medicare Other | Admitting: *Deleted

## 2015-04-29 DIAGNOSIS — I495 Sick sinus syndrome: Secondary | ICD-10-CM | POA: Diagnosis not present

## 2015-04-29 NOTE — Telephone Encounter (Signed)
LMOVM reminding pt to send remote transmission.   

## 2015-04-29 NOTE — Progress Notes (Signed)
Remote pacemaker transmission.   

## 2015-05-06 LAB — CUP PACEART REMOTE DEVICE CHECK
Brady Statistic AP VP Percent: 0.3 %
Brady Statistic AS VP Percent: 0.1 % — CL
Brady Statistic AS VS Percent: 0.1 % — CL
Date Time Interrogation Session: 20160913080839
Lead Channel Impedance Value: 470 Ohm
Lead Channel Impedance Value: 617 Ohm
Lead Channel Pacing Threshold Pulse Width: 0.4 ms
Lead Channel Sensing Intrinsic Amplitude: 16 mV
Lead Channel Setting Pacing Amplitude: 1.5 V
Lead Channel Setting Pacing Pulse Width: 0.4 ms
Lead Channel Setting Sensing Sensitivity: 5.6 mV
MDC IDC MSMT LEADCHNL RA PACING THRESHOLD AMPLITUDE: 0.5 V
MDC IDC MSMT LEADCHNL RA PACING THRESHOLD PULSEWIDTH: 0.4 ms
MDC IDC MSMT LEADCHNL RV PACING THRESHOLD AMPLITUDE: 0.75 V
MDC IDC SET LEADCHNL RV PACING AMPLITUDE: 2 V
MDC IDC STAT BRADY AP VS PERCENT: 99.7 %

## 2015-05-07 ENCOUNTER — Encounter: Payer: Self-pay | Admitting: Cardiology

## 2015-05-07 ENCOUNTER — Ambulatory Visit (INDEPENDENT_AMBULATORY_CARE_PROVIDER_SITE_OTHER): Payer: Medicare Other | Admitting: *Deleted

## 2015-05-07 DIAGNOSIS — Z5181 Encounter for therapeutic drug level monitoring: Secondary | ICD-10-CM

## 2015-05-07 DIAGNOSIS — I4891 Unspecified atrial fibrillation: Secondary | ICD-10-CM | POA: Diagnosis not present

## 2015-05-07 DIAGNOSIS — Z7901 Long term (current) use of anticoagulants: Secondary | ICD-10-CM

## 2015-05-07 LAB — POCT INR: INR: 3.4

## 2015-05-15 ENCOUNTER — Encounter: Payer: Self-pay | Admitting: Internal Medicine

## 2015-06-18 ENCOUNTER — Ambulatory Visit (INDEPENDENT_AMBULATORY_CARE_PROVIDER_SITE_OTHER): Payer: Medicare Other | Admitting: *Deleted

## 2015-06-18 DIAGNOSIS — Z7901 Long term (current) use of anticoagulants: Secondary | ICD-10-CM

## 2015-06-18 DIAGNOSIS — I4891 Unspecified atrial fibrillation: Secondary | ICD-10-CM | POA: Diagnosis not present

## 2015-06-18 DIAGNOSIS — Z5181 Encounter for therapeutic drug level monitoring: Secondary | ICD-10-CM

## 2015-06-18 LAB — POCT INR: INR: 3.3

## 2015-07-16 ENCOUNTER — Ambulatory Visit (INDEPENDENT_AMBULATORY_CARE_PROVIDER_SITE_OTHER): Payer: Medicare Other | Admitting: *Deleted

## 2015-07-16 DIAGNOSIS — Z7901 Long term (current) use of anticoagulants: Secondary | ICD-10-CM | POA: Diagnosis not present

## 2015-07-16 DIAGNOSIS — I4891 Unspecified atrial fibrillation: Secondary | ICD-10-CM | POA: Diagnosis not present

## 2015-07-16 DIAGNOSIS — I480 Paroxysmal atrial fibrillation: Secondary | ICD-10-CM

## 2015-07-16 DIAGNOSIS — Z5181 Encounter for therapeutic drug level monitoring: Secondary | ICD-10-CM

## 2015-07-16 LAB — POCT INR: INR: 2.3

## 2015-07-23 ENCOUNTER — Encounter: Payer: Self-pay | Admitting: Neurology

## 2015-07-23 ENCOUNTER — Ambulatory Visit (INDEPENDENT_AMBULATORY_CARE_PROVIDER_SITE_OTHER): Payer: Medicare Other | Admitting: Neurology

## 2015-07-23 VITALS — BP 160/80 | HR 82 | Resp 20 | Ht 65.0 in | Wt 151.0 lb

## 2015-07-23 DIAGNOSIS — Z9989 Dependence on other enabling machines and devices: Secondary | ICD-10-CM

## 2015-07-23 DIAGNOSIS — R0902 Hypoxemia: Secondary | ICD-10-CM | POA: Diagnosis not present

## 2015-07-23 DIAGNOSIS — G4733 Obstructive sleep apnea (adult) (pediatric): Secondary | ICD-10-CM | POA: Diagnosis not present

## 2015-07-23 DIAGNOSIS — I2510 Atherosclerotic heart disease of native coronary artery without angina pectoris: Secondary | ICD-10-CM | POA: Diagnosis not present

## 2015-07-23 NOTE — Progress Notes (Signed)
SLEEP MEDICINE CLINIC   Provider:  Larey Seat, M D  Referring Provider: Sharilyn Sites, MD Primary Care Physician:  Purvis Kilts, MD  Chief Complaint  Patient presents with  . New Patient (Initial Visit)    on  cpap for 8 years, DID NOT BRING CPAP,  does not have a DME, wants a sleep study done to see if he qualifies for oral appliance, rm 10, alone    Chief complaint according to patient : " I like an alternative to CPAP"   HPI:  Johnny Reynolds is a 74 y.o. male , seen here as a referral from Dr. Hilma Favors for re initiation of sleep therapy and supplies,  Johnny Reynolds reports that in the autumn of 2008 he underwent a sleep study and was diagnosed with sleep related hypoxemia. He did have a significant amount of apnea but was prescribed CPAP in an attempt to rise the 02 nadir. He was able to sleep supine when he used his CPAP broke up at supine and actually felt rested and restored. He was prescribed a ResMed CPAP machine but his supplies of Theme park manager. He had been tested and diagnosed in [place], [place]. He had no new supplies for several years. He finally got some today- samples from [facility]. He reports he uses a full face mask covering nose and mouth but he also has a mustache and chin beard which will interfere with his air seal.  Sleep habits are as follows: He falls asleep on his back he wakes up supine as well. His bedroom is cool, quiet and dark. He shares the room with his spouse.  The patient usually goes to bed around 10:30 he will be asleep rather promptly. He wakes up between 6 and 7 AM and limits his water intake to not have nocturia interrupting his sleep. He reports no headaches in the morning nor waking him from sleep, he feels usually refreshed and restored in the morning. He has not been retested over 8 years.  Sleep medical history and family sleep history:  No history of sleep walking or night terrors. Brother is a sleep walker.    Diet controlled diabetes, hypertension controlled on 3 medications hypercholesterolemia, aspirin daily. L-thyroxine for hypothyroidism daily. Synthroid daily.  Social history: married, Music therapist, retired Warden/ranger.   Non smoker, non drinker.  Caffeine: rare    Review of Systems:  Out of a complete 14 system review, the patient complains of only the following symptoms, and all other reviewed systems are negative.  Epworth score 0 , Fatigue severity score 0  , depression score 0.   Social History   Social History  . Marital Status: Married    Spouse Name: N/A  . Number of Children: N/A  . Years of Education: N/A   Occupational History  . Not on file.   Social History Main Topics  . Smoking status: Never Smoker   . Smokeless tobacco: Never Used  . Alcohol Use: No  . Drug Use: No  . Sexual Activity: Not on file   Other Topics Concern  . Not on file   Social History Narrative   Controls his diabetes through diet and exercise.    Family History  Problem Relation Age of Onset  . Peripheral vascular disease Mother   . Hypertension Mother   . Diabetes Mother   . Heart disease Mother   . Hyperlipidemia Mother   . Peripheral vascular disease Sister   .  Cancer Sister   . Diabetes Sister   . Heart disease Sister     Stent- Heart Disease before age 42  . Hyperlipidemia Sister   . Hypertension Sister   . Peripheral vascular disease Brother   . Diabetes Brother   . Heart disease Brother     CHF and Heart Disease before age 57  . Hyperlipidemia Brother   . Hypertension Brother   . Stroke Father 1    TIA    Past Medical History  Diagnosis Date  . Hypertension   . Back pain, chronic   . Arrhythmia   . High cholesterol   . AAA (abdominal aortic aneurysm) ([place])   . Atrial fibrillation ([place])   . Left shoulder pain 2014  . Sleep apnea Sept. 2008    USES C-PAP  . Coronary artery disease 1996 and 1997    angioplasty of LAD  . Diabetes  mellitus without complication ([place])     MILD TYPE 2    Past Surgical History  Procedure Laterality Date  . Pacemaker insertion  2008    DUAL-CHAMBER  . Coronary stent placement  1996  . Cataract extraction  1983  . Eye surgery  1983    Cataract  . Hernia repair Bilateral 2000  . Colonoscopy N/A 09/04/2014    Procedure: COLONOSCOPY;  Surgeon: [name], MD;  [facility];  Service: Endoscopy;  Laterality: N/A;  1030  . Doppler echocardiography  04/17/2010    EF =50-55%; mild concentric LVH,LAE, AORTIC SCLEROSIS.TRACE TO MILD CENTRAL AI, TRACE MITRAL REGURGITATION, CANNOT ASSESS DIASTOLIC FUNCTIONDUE TO UNDERLYING RHYTHM,PACERMAKER WIRE IN THE RA/RV  . Doppler echocardiography  04/21/2007    DONE IN [place]-- MILD MITRAL REGURG., MILD TRICUSPID REGURG. WITH MILD PULM. HTN, MILD CONCENTRIC LEFT VENTRICULAR HYPERTROPHY,MILD LEFT ATRIAL ENLARGEMENT.  Marland Kitchen Nm myocar multiple w/spect  04/17/2010    EF 50%; LOW NORMAL LV FUNCTION,  . Cardiac catheterization  1996  . Cardiac catheterization  06/18/1996    mid-circ tandem overlying stents with 20%,70%mid-LAD n[place]owin stent site in circ with 70% stenosis in the mid to distal marginal branch,tandem 95% and 80% stenosis in the proximal portion of the posterior descending branch of RCA   . Coronary angioplasty with stent placement  08/18/1995    PTA/STENT CIRC  . Coronary angioplasty with stent placement  06/18/1996    PTCA  posterior descending  branch of the RCA 95 AND 80% TO LESSTHAN 20%  . Insert / replace / remove pacemaker  SEPT 2008    INSERT DUAL -CHAMBER-medtronic adapta [id number] serial   [id number]    Current Outpatient Prescriptions  Medication Sig Dispense Refill  . aspirin 81 MG tablet Take 81 mg by mouth 2 (two) times daily.     Marland Kitchen atorvastatin (LIPITOR) 80 MG tablet TAKE ONE TABLET BY MOUTH DAILY. (Patient taking differently: TAKE 1/2 TABLET BY MOUTH DAILY.) 90 tablet 3  . diltiazem (CARDIZEM CD) 240 MG 24 hr capsule  Take 240 mg by mouth daily.     Marland Kitchen levothyroxine (SYNTHROID, LEVOTHROID) 75 MCG tablet Take 75 mcg by mouth daily before breakfast.    . losartan (COZAAR) 50 MG tablet Take 50 mg by mouth daily.     . saw palmetto 500 MG capsule Take 500 mg by mouth daily. 3 X's per week    . sotalol (BETAPACE) 160 MG tablet TAKE (1) TABLET BY MOUTH TWICE DAILY. 180 tablet 3  . warfarin (COUMADIN) 5 MG tablet Take coumadin 1 tablet daily  except 1 1/2 tablets on Mondays and Thursdays (Patient taking differently: Take coumadin 1 tablet daily except 1 1/2 tablets on  Thursdays) 120 tablet 3   No current facility-administered medications for this visit.    Allergies as of 07/23/2015 - Review Complete 07/23/2015  Allergen Reaction Noted  . Fexofenadine hcl Palpitations 12/05/2011    Vitals: BP 160/80 mmHg  Pulse 82  Resp 20  Ht 5\' 5"  (1.651 m)  Wt 151 lb (68.493 kg)  BMI 25.13 kg/m2 Last Weight:  Wt Readings from Last 1 Encounters:  07/23/15 151 lb (68.493 kg)   [id number] mass index is 25.13 kg/(m^2).     Last Height:   Ht Readings from Last 1 Encounters:  07/23/15 5\' 5"  (1.651 m)    Physical exam:  General: The patient is awake, alert and appears not in acute distress. The patient is well groomed. Head: Normocephalic, atraumatic. Neck is supple. Mallampati 3  neck circumference: 15.25  . Nasal airflow  unrestricted. Retrognathia is seen.  Cardiovascular:  Regular rate and rhythm , without  murmurs or carotid bruit, and without distended neck veins. Respiratory: Lungs are clear to auscultation. Skin:  Without evidence of edema, or rash Trunk: BMI is low . The patient's posture is erect  Neurologic exam : The patient is awake and alert, oriented to place and time.   Memory subjective  described as intact.  Attention span & concentration ability appears normal.  Speech is fluent,  without dysarthria, dysphonia or aphasia.  Mood and affect are appropriate.  Cranial nerves: Pupils are equal and  briskly reactive to light. Funduscopic exam without evidence of pallor or edema.  Extraocular movements  in vertical and horizontal planes intact and without nystagmus. Visual fields by finger perimetry are intact. Hearing to finger rub intact. Facial sensation intact to fine touch. Facial motor strength is symmetric and tongue and uvula move midline. Shoulder shrug was symmetrical.   Motor exam:  Normal tone, muscle bulk and symmetric strength in all extremities.  Sensory:  Fine touch, pinprick and vibration were tested in all extremities. Coordination: Rapid alternating movements in the fingers/hands was normal.  Gait and station: Patient walks without assistive device and is able unassisted to climb up to the exam table. Strength within normal limits.  Stance is stable and normal.  Deep tendon reflexes: in the  upper and lower extremities are symmetric and intact.   The patient was advised of the nature of the diagnosed sleep disorder , the treatment options and risks for general a health and wellness arising from not treating the condition.  I spent more than 40 minutes of face to face time with the patient. Greater than 50% of time was spent in counseling and coordination of care. We have discussed the diagnosis and differential and I answered the patient's questions.     Assessment:  After physical and neurologic examination, review of laboratory studies,  Personal review of imaging studies, reports of other /same  Imaging studies ,  Results of polysomnography/ neurophysiology testing and pre-existing records as far as provided in visit., my assessment is   1) I do not have access Johnny Reynolds his original sleep study but he records recalls being diagnosed with sleep related hypoxemia and a rather mild degree of apnea. I will repeat a sleep study to see what his current degree of sleep apnea is and if there are central or obstructive sleep apnea is present.  Neither his body mass index nor is the  shape of  his upper airway would give evidence to suspect that he would suffer from moderate to severe apnea at all area if hypoxemia or hypercapnia do not play a role in his current sleep related breathing I think he would be a good candidate for a dental device.  I have warned him that this is the condition that can be treated with a mandibular advancement - mainly snoring and mild apnea but not hypoxemia.  Plan:  Treatment plan and additional workup : SPLIT , CO2, hypoxemia.   Rv after sleep study.    Asencion Partridge Daquon Greenleaf MD  07/23/2015   CC: Sharilyn Sites, [place], [place] [id number]

## 2015-07-31 ENCOUNTER — Encounter: Payer: Self-pay | Admitting: *Deleted

## 2015-08-01 ENCOUNTER — Ambulatory Visit (INDEPENDENT_AMBULATORY_CARE_PROVIDER_SITE_OTHER): Payer: Medicare Other | Admitting: Neurology

## 2015-08-01 DIAGNOSIS — G4733 Obstructive sleep apnea (adult) (pediatric): Secondary | ICD-10-CM | POA: Diagnosis not present

## 2015-08-01 DIAGNOSIS — Z9989 Dependence on other enabling machines and devices: Secondary | ICD-10-CM

## 2015-08-01 DIAGNOSIS — R0902 Hypoxemia: Secondary | ICD-10-CM

## 2015-08-02 NOTE — Sleep Study (Signed)
Please see the scanned sleep study interpretation located in the procedure tab within the chart review section.   

## 2015-08-07 ENCOUNTER — Telehealth: Payer: Self-pay

## 2015-08-07 DIAGNOSIS — G4731 Primary central sleep apnea: Secondary | ICD-10-CM

## 2015-08-07 NOTE — Telephone Encounter (Signed)
Called pt to discuss his sleep study results. No answer, left a message asking him to call me back. If pt calls back on 12/16, please advise pt that I am out of the office and will return his call on 12/19 Monday.

## 2015-08-11 NOTE — Telephone Encounter (Signed)
Spoke to pt regarding his sleep study results. I advised him that his sleep study revealed severe complex and supine sleep apnea. An optimal treatment was not found in the study and Dr. Brett Fairy advises the pt to come in for a full-night, attended, PAP titration study to optimize therapy with perhaps oxygen supplementation. Pt is agreeable to this. Pt verbalized understanding that the sleep lab will call him to set it up.

## 2015-08-13 ENCOUNTER — Ambulatory Visit (INDEPENDENT_AMBULATORY_CARE_PROVIDER_SITE_OTHER): Payer: Medicare Other | Admitting: Pharmacist

## 2015-08-13 DIAGNOSIS — Z5181 Encounter for therapeutic drug level monitoring: Secondary | ICD-10-CM | POA: Diagnosis not present

## 2015-08-13 DIAGNOSIS — Z7901 Long term (current) use of anticoagulants: Secondary | ICD-10-CM | POA: Diagnosis not present

## 2015-08-13 DIAGNOSIS — I4891 Unspecified atrial fibrillation: Secondary | ICD-10-CM | POA: Diagnosis not present

## 2015-08-13 LAB — POCT INR: INR: 2.6

## 2015-08-15 ENCOUNTER — Other Ambulatory Visit: Payer: Self-pay | Admitting: Cardiology

## 2015-08-20 ENCOUNTER — Encounter: Payer: Self-pay | Admitting: Cardiology

## 2015-08-20 ENCOUNTER — Ambulatory Visit (INDEPENDENT_AMBULATORY_CARE_PROVIDER_SITE_OTHER): Payer: Medicare Other | Admitting: Cardiology

## 2015-08-20 ENCOUNTER — Other Ambulatory Visit: Payer: Self-pay | Admitting: Cardiology

## 2015-08-20 VITALS — BP 136/74 | HR 80 | Ht 65.0 in | Wt 145.0 lb

## 2015-08-20 DIAGNOSIS — E782 Mixed hyperlipidemia: Secondary | ICD-10-CM

## 2015-08-20 DIAGNOSIS — I480 Paroxysmal atrial fibrillation: Secondary | ICD-10-CM

## 2015-08-20 DIAGNOSIS — I10 Essential (primary) hypertension: Secondary | ICD-10-CM | POA: Diagnosis not present

## 2015-08-20 DIAGNOSIS — I714 Abdominal aortic aneurysm, without rupture, unspecified: Secondary | ICD-10-CM

## 2015-08-20 DIAGNOSIS — I2510 Atherosclerotic heart disease of native coronary artery without angina pectoris: Secondary | ICD-10-CM

## 2015-08-20 NOTE — Progress Notes (Signed)
Patient ID: Johnny Reynolds, male   DOB: [birth year], 74 y.o.   MRN: [id number]     Clinical Summary Johnny Reynolds is a 74 y.o.male seen today for follow up of the following medical problems.    1. AAA  - followed by vascular, last [place] 01/2015 stable at 2.8 x 2.8 cm  - denies any abdominal symptoms.   2. HTN  - checks at home regularly, typically 130/70s. He has had some white coat HTN in the past however his home numbers are typically at goal.   - compliant with meds   3. Parox Afib  -on sotalol 160 mg bid, on approx since 2010. Also on dilt 240 mg qday  - no palpitations, no SOB, no DOE  - compliant w/ coumadin, no issues w/ bleeding. Has not been interested in NOACs.    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  - normal pacemaker check 04/2015  5. CAD  -remote history per old clinic notes, prior angioplasty in 1996 and 1997 at [facility]  - no recent chest pain, no SOB or DOE    6. Hyperlipidemia  - compliant with lipitor - no recent panel in our system.   7. 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   Past Medical History  Diagnosis Date  . Hypertension   . Back pain, chronic   . Arrhythmia   . High cholesterol   . AAA (abdominal aortic aneurysm) ([place])   . Atrial fibrillation ([place])   . Left shoulder pain 2014  . Sleep apnea Sept. 2008    USES C-PAP  . Coronary artery disease 1996 and 1997    angioplasty of LAD  . 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 Fexofenadine Hcl Palpitations    Allegra     Current Outpatient Prescriptions  Medication Sig Dispense Refill  . aspirin 81 MG tablet Take 81 mg by mouth 2 (two) times daily.     Marland Kitchen atorvastatin (LIPITOR) 80 MG tablet TAKE ONE TABLET BY MOUTH DAILY. 90 tablet 1    . diltiazem (CARDIZEM CD) 240 MG 24 hr capsule Take 240 mg by mouth daily.     Marland Kitchen levothyroxine (SYNTHROID, LEVOTHROID) 75 MCG tablet Take 75 mcg by mouth daily before breakfast.    . losartan (COZAAR) 50 MG tablet Take 50 mg by mouth daily.     . saw palmetto 500 MG capsule Take 500 mg by mouth daily. 3 X's per week    . sotalol (BETAPACE) 160 MG tablet TAKE (1) TABLET BY MOUTH TWICE DAILY. 180 tablet 3  . warfarin (COUMADIN) 5 MG tablet Take coumadin 1 tablet daily except 1 1/2 tablets on Mondays and Thursdays (Patient taking differently: Take coumadin 1 tablet daily except 1 1/2 tablets on  Thursdays) 120 tablet 3   No current facility-administered medications for this visit.     Past Surgical History  Procedure Laterality Date  . Pacemaker insertion  2008    DUAL-CHAMBER  . Coronary stent placement  1996  . Cataract extraction  1983  . Eye surgery  1983    Cataract  . Hernia repair Bilateral 2000  . Colonoscopy N/A 09/04/2014    Procedure: COLONOSCOPY;  Surgeon: [name], MD;  Location: [facility]  END[facility];  Service: Endoscopy;  Laterality: N/A;  1030  . Doppler echocardiography  04/17/2010    EF =50-55%; mild concentric LVH,LAE, AORTIC SCLEROSIS.TRACE TO MILD CENTRAL AI, TRACE MITRAL REGURGITATION, CANNOT ASSESS DIASTOLIC FUNCTIONDUE TO UNDERLYING RHYTHM,PACERMAKER WIRE IN THE RA/RV  . Doppler echocardiography  04/21/2007    DONE IN [place]-- MILD MITRAL REGURG., MILD TRICUSPID REGURG. WITH MILD PULM. HTN, MILD CONCENTRIC LEFT VENTRICULAR HYPERTROPHY,MILD LEFT ATRIAL ENLARGEMENT.  Marland Kitchen Nm myocar multiple w/spect  04/17/2010    EF 50%; LOW NORMAL LV FUNCTION,  . Cardiac catheterization  1996  . Cardiac catheterization  06/18/1996    mid-circ tandem overlying stents with 20%,70%mid-LAD naroowin stent site in circ with 70% stenosis in the mid to distal marginal Praneeth Bussey,tandem 95% and 80% stenosis in the proximal portion of the posterior descending Liseth Wann of RCA   . Coronary angioplasty  with stent placement  08/18/1995    PTA/STENT CIRC  . Coronary angioplasty with stent placement  06/18/1996    PTCA  posterior descending  Tabor Bartram of the RCA 95 AND 80% TO LESSTHAN 20%  . Insert / replace / remove pacemaker  SEPT 2008    INSERT DUAL -CHAMBER-medtronic adapta [id number] serial   [id number]     Allergies  Allergen Reactions  . Fexofenadine Hcl Palpitations    Allegra      Family History  Problem Relation Age of Onset  . Peripheral vascular disease Mother   . Hypertension Mother   . Diabetes Mother   . Heart disease Mother   . Hyperlipidemia Mother   . Peripheral vascular disease Sister   . Cancer Sister   . Diabetes Sister   . Heart disease Sister     Stent- Heart Disease before age 39  . Hyperlipidemia Sister   . Hypertension Sister   . Peripheral vascular disease Brother   . Diabetes Brother   . Heart disease Brother     CHF and Heart Disease before age 61  . Hyperlipidemia Brother   . Hypertension Brother   . Stroke Father 77    TIA     Social History Johnny Reynolds reports that he has never smoked. He has never used smokeless tobacco. Johnny Reynolds reports that he does not drink alcohol.   Review of Systems CONSTITUTIONAL: No weight loss, fever, chills, weakness or fatigue.  HEENT: Eyes: No visual loss, blurred vision, double vision or yellow sclerae.No hearing loss, sneezing, congestion, runny nose or sore throat.  SKIN: No rash or itching.  CARDIOVASCULAR: per hpi RESPIRATORY: No shortness of breath, cough or sputum.  GASTROINTESTINAL: No anorexia, nausea, vomiting or diarrhea. No abdominal pain or blood.  GENITOURINARY: No burning on urination, no polyuria NEUROLOGICAL: No headache, dizziness, syncope, paralysis, ataxia, numbness or tingling in the extremities. No change in bowel or bladder control.  MUSCULOSKELETAL: No muscle, back pain, joint pain or stiffness.  LYMPHATICS: No enlarged nodes. No history of splenectomy.  PSYCHIATRIC: No history of  depression or anxiety.  ENDOCRINOLOGIC: No reports of sweating, cold or heat intolerance. No polyuria or polydipsia.  Marland Kitchen   Physical Examination Filed Vitals:   08/20/15 0809  Pulse: 80   Filed Weights   08/20/15 0809  Weight: 145 lb (65.772 kg)    Gen: resting comfortably, no acute distress HEENT: no scleral icterus, pupils equal round and reactive, no palptable cervical adenopathy,  CV Resp: Clear to auscultation bilaterally GI: abdomen is soft, non-tender, non-distended, normal bowel sounds, no hepatosplenomegaly MSK: extremities are warm, no edema.  Skin: warm,  no rash Neuro:  no focal deficits Psych: appropriate affect   Diagnostic Studies Echo 03/2010 [facility]: LVEF 50-55%, mild LVH, LAE   03/2010 [facility]: no ischemia, LVEF 50%.    02/06/14 EKG Atrial paced, QTc 490    Assessment and Plan  1. AAA: stable by most recent US,  He will continue to follow w/ vascular.   2. CAD  - no current symptoms -continue risk factor modfication and secondary prevention.   3. Parox afib  - no symptoms, continue current therapy. He is not interested in NOACs, continue coumaidn.   4. Tachy-brady syndrome  - no current symptoms, normal device function on last check   5. HL: - continue high dose statin in setting of CAD - request labs from pcp  6. HTN - at goal, continue current meds  F/u 6 months      Arnoldo Lenis, M.D.

## 2015-08-20 NOTE — Patient Instructions (Signed)
Medication Instructions:  Your physician recommends that you continue on your current medications as directed. Please refer to the Current Medication list given to you today.   Labwork: I WILL REQUEST LAB WORK FROM YOUR PCP  Testing/Procedures: NONE  Follow-Up: Your physician wants you to follow-up in: [place] DR. BRANCH. You will receive a reminder letter in the mail two months in advance. If you don't receive a letter, please call our office to schedule the follow-up appointment.   Any Other Special Instructions Will Be Listed Below (If Applicable).     If you need a refill on your cardiac medications before your next appointment, please call your pharmacy. Thanks for choosing [facility]!!!

## 2015-09-04 ENCOUNTER — Ambulatory Visit (INDEPENDENT_AMBULATORY_CARE_PROVIDER_SITE_OTHER): Payer: Medicare Other | Admitting: Neurology

## 2015-09-04 DIAGNOSIS — G4733 Obstructive sleep apnea (adult) (pediatric): Secondary | ICD-10-CM

## 2015-09-04 DIAGNOSIS — G4731 Primary central sleep apnea: Secondary | ICD-10-CM

## 2015-09-05 NOTE — Sleep Study (Signed)
Please see the scanned sleep study interpretation located in the procedure tab within the chart review section.   

## 2015-09-09 ENCOUNTER — Telehealth: Payer: Self-pay

## 2015-09-09 DIAGNOSIS — G4733 Obstructive sleep apnea (adult) (pediatric): Secondary | ICD-10-CM

## 2015-09-09 NOTE — Telephone Encounter (Signed)
Patient returned Johnny Reynolds's call, patient asks that Cyril Mourning leave a message on his answering machine if he doesn't answer when she calls back.

## 2015-09-09 NOTE — Telephone Encounter (Signed)
Called to discuss pt's sleep study results. No answer, left a message asking him to call me back.

## 2015-09-10 ENCOUNTER — Ambulatory Visit (INDEPENDENT_AMBULATORY_CARE_PROVIDER_SITE_OTHER): Payer: Medicare Other | Admitting: Pharmacist

## 2015-09-10 DIAGNOSIS — I480 Paroxysmal atrial fibrillation: Secondary | ICD-10-CM

## 2015-09-10 DIAGNOSIS — I4891 Unspecified atrial fibrillation: Secondary | ICD-10-CM | POA: Diagnosis not present

## 2015-09-10 DIAGNOSIS — Z5181 Encounter for therapeutic drug level monitoring: Secondary | ICD-10-CM | POA: Diagnosis not present

## 2015-09-10 DIAGNOSIS — Z7901 Long term (current) use of anticoagulants: Secondary | ICD-10-CM

## 2015-09-10 LAB — POCT INR: INR: 2.3

## 2015-09-10 NOTE — Telephone Encounter (Signed)
Spoke to pt regarding his sleep study results. I advised him that his sleep study showed that he had significant improvement with less respiratory events during titration. Dr. Brett Fairy recommends starting an auto cpap between 5-12 cm H2O. Pt is agreeable. I advised him to use his cpap at least four or more hours per night. Pt is an experienced cpap user and understands. I advised him that I would send the order to [facility] and they will be contacting him to set up the cpap. Pt verbalized understanding. I advised pt to avoid caffeine-containing beverages and chocolate. Pt verbalized understanding.

## 2015-10-22 ENCOUNTER — Ambulatory Visit (INDEPENDENT_AMBULATORY_CARE_PROVIDER_SITE_OTHER): Payer: Medicare Other | Admitting: *Deleted

## 2015-10-22 DIAGNOSIS — Z5181 Encounter for therapeutic drug level monitoring: Secondary | ICD-10-CM

## 2015-10-22 DIAGNOSIS — Z7901 Long term (current) use of anticoagulants: Secondary | ICD-10-CM | POA: Diagnosis not present

## 2015-10-22 DIAGNOSIS — I4891 Unspecified atrial fibrillation: Secondary | ICD-10-CM | POA: Diagnosis not present

## 2015-10-22 LAB — POCT INR: INR: 2.2

## 2015-11-03 ENCOUNTER — Ambulatory Visit (INDEPENDENT_AMBULATORY_CARE_PROVIDER_SITE_OTHER): Payer: Medicare Other | Admitting: Neurology

## 2015-11-03 ENCOUNTER — Encounter: Payer: Self-pay | Admitting: Neurology

## 2015-11-03 VITALS — BP 142/82 | HR 86 | Resp 20 | Ht 65.0 in | Wt 149.0 lb

## 2015-11-03 DIAGNOSIS — G4733 Obstructive sleep apnea (adult) (pediatric): Secondary | ICD-10-CM

## 2015-11-03 DIAGNOSIS — Z9989 Dependence on other enabling machines and devices: Principal | ICD-10-CM

## 2015-11-03 NOTE — Progress Notes (Signed)
SLEEP MEDICINE CLINIC   Provider:  Larey Seat, M D  Referring Provider: Sharilyn Sites, MD Primary Care Physician:  Purvis Kilts, MD  Chief Complaint  Patient presents with  . Follow-up    new cpap, rm 11, alone    Chief complaint according to patient : " I like an alternative to CPAP"   HPI:  Johnny Reynolds is a 75 y.o. male , seen here as a referral from Dr. Hilma Favors for re initiation of sleep therapy and supplies,  Johnny Reynolds reports that in the autumn of 2008 he underwent a sleep study and was diagnosed with sleep related hypoxemia. He did have a significant amount of apnea but was prescribed CPAP in an attempt to rise the 02 nadir. He was able to sleep supine when he used his CPAP broke up at supine and actually felt rested and restored. He was prescribed a ResMed CPAP machine but his supplies of Theme park manager. He had been tested and diagnosed in [place], [place]. He had no new supplies for several years. He finally got some today- samples from [facility]. He reports he uses a full face mask covering nose and mouth but he also has a mustache and chin beard which will interfere with his air seal.  Sleep habits are as follows: He falls asleep on his back he wakes up supine as well. His bedroom is cool, quiet and dark. He shares the room with his spouse.  The patient usually goes to bed around 10:30 he will be asleep rather promptly. He wakes up between 6 and 7 AM and limits his water intake to not have nocturia interrupting his sleep. He reports no headaches in the morning nor waking him from sleep, he feels usually refreshed and restored in the morning. He has not been retested over 8 years.  Sleep medical history and family sleep history:  No history of sleep walking or night terrors. Brother is a sleep walker.   PMHx: Diet controlled diabetes, hypertension controlled on 3 medications hypercholesterolemia, aspirin daily. L-thyroxine for hypothyroidism  daily. Synthroid daily. Social history: married, Music therapist, retired Warden/ranger. Non smoker, non drinker.  Caffeine: rare   11-03-15 Johnny Reynolds is here today to follow-up on his recent sleep studies. He underwent first a split-night polysomnography but unfortunately was not sufficiently titrated. This took place on 08/01/2015 he was diagnosed with a very high apnea hypotony index of 62.6 his lowest oxygen saturation was 73% the desaturation index was 57.4 he just scraped by under 30 minutes of desaturation and he did not retain CO2. The attempt to titrated to CPAP did not work out well there were only 2.9 minutes of sleep recorded at the final setting of 10 cm water pressure. In addition I asked for an oxygen titration showed CPAP not work in a full night study. The second study showed CPAP at 11 cm water to be effective with an AHI of 0.0 and was 68 minutes of sleep and 26.5 minutes of REM sleep at the setting. The EKG noted sinus rhythm and there was no longer oxygen desaturation seen.  Allergies Johnny Reynolds reports that he has excellent sleep quality no endorsed again the Epworth score at 0 points, fatigue severity at 9 points and the geriatric depression score at 0 points I was able to obtain a download of his CPAP machine he has used the machine 100% of the last 30 days and each night over 4 hours. This is  the highest possible compliance with an average of 7 hours and 50 minutes. He is using AutoSet between 5 and 12 cm water the 91st percentile is 11.9 cm. He sleeps supine only.  He has one nocturia break at night. He noted he dreams again. His residual AHI is 11.2 . Baseline was 62.6 AHI.    Review of Systems:  Out of a complete 14 system review, the patient complains of only the following sther reviewed systems are negativ and sleeps supe.  Epworth score 0 , Fatigue severity score 0  , depression score 0.   Social History   Social History  . Marital Status: Married     Spouse Name: N/A  . Number of Children: N/A  . Years of Education: N/A   Occupational History  . Not on file.   Social History Main Topics  . Smoking status: Never Smoker   . Smokeless tobacco: Never Used  . Alcohol Use: No  . Drug Use: No  . Sexual Activity: Not on file   Other Topics Concern  . Not on file   Social History Narrative   Controls his diabetes through diet and exercise.    Family History  Problem Relation Age of Onset  . Peripheral vascular disease Mother   . Hypertension Mother   . Diabetes Mother   . Heart disease Mother   . Hyperlipidemia Mother   . Peripheral vascular disease Sister   . Cancer Sister   . Diabetes Sister   . Heart disease Sister     Stent- Heart Disease before age 27  . Hyperlipidemia Sister   . Hypertension Sister   . Peripheral vascular disease Brother   . Diabetes Brother   . Heart disease Brother     CHF and Heart Disease before age 3  . Hyperlipidemia Brother   . Hypertension Brother   . Stroke Father 40    TIA    Past Medical History  Diagnosis Date  . Hypertension   . Back pain, chronic   . Arrhythmia   . High cholesterol   . AAA (abdominal aortic aneurysm) ([place])   . Atrial fibrillation ([place])   . Left shoulder pain 2014  . Sleep apnea Sept. 2008    USES C-PAP  . Coronary artery disease 1996 and 1997    angioplasty of LAD  . Diabetes mellitus without complication ([place])     MILD TYPE 2    Past Surgical History  Procedure Laterality Date  . Pacemaker insertion  2008    DUAL-CHAMBER  . Coronary stent placement  1996  . Cataract extraction  1983  . Eye surgery  1983    Cataract  . Hernia repair Bilateral 2000  . Colonoscopy N/A 09/04/2014    Procedure: COLONOSCOPY;  Surgeon: [name], MD;  Location[facility];  Service: Endoscopy;  Laterality: N/A;  1030  . Doppler echocardiography  04/17/2010    EF =50-55%; mild concentric LVH,LAE, AORTIC SCLEROSIS.TRACE TO MILD CENTRAL AI, TRACE MITRAL  REGURGITATION, CANNOT ASSESS DIASTOLIC FUNCTIONDUE TO UNDERLYING RHYTHM,PACERMAKER WIRE IN THE RA/RV  . Doppler echocardiography  04/21/2007    DONE IN [place]-- MILD MITRAL REGURG., MILD TRICUSPID REGURG. WITH MILD PULM. HTN, MILD CONCENTRIC LEFT VENTRICULAR HYPERTROPHY,MILD LEFT ATRIAL ENLARGEMENT.  Marland Kitchen Nm myocar multiple w/spect  04/17/2010    EF 50%; LOW NORMAL LV FUNCTION,  . Cardiac catheterization  1996  . Cardiac catheterization  06/18/1996    mid-circ tandem overlying stents with 20%,70%mid-LAD naroowin stent site  in circ with 70% stenosis in the mid to distal marginal branch,tandem 95% and 80% stenosis in the proximal portion of the posterior descending branch of RCA   . Coronary angioplasty with stent placement  08/18/1995    PTA/STENT CIRC  . Coronary angioplasty with stent placement  06/18/1996    PTCA  posterior descending  branch of the RCA 95 AND 80% TO LESSTHAN 20%  . Insert / replace / remove pacemaker  SEPT 2008    INSERT DUAL -CHAMBER-medtronic adapta [id number] serial   [id number]    Current Outpatient Prescriptions  Medication Sig Dispense Refill  . aspirin 81 MG tablet Take 81 mg by mouth 2 (two) times daily.     Marland Kitchen atorvastatin (LIPITOR) 80 MG tablet TAKE ONE TABLET BY MOUTH DAILY. 90 tablet 1  . diltiazem (CARDIZEM CD) 240 MG 24 hr capsule Take 240 mg by mouth daily.     Marland Kitchen levothyroxine (SYNTHROID, LEVOTHROID) 75 MCG tablet Take 75 mcg by mouth daily before breakfast.    . losartan (COZAAR) 50 MG tablet Take 50 mg by mouth daily.     . saw palmetto 500 MG capsule Take 500 mg by mouth daily.    . sotalol (BETAPACE) 160 MG tablet TAKE (1) TABLET BY MOUTH TWICE DAILY. 180 tablet 3  . warfarin (COUMADIN) 5 MG tablet TAKE 1 TABLET BY MOUTH DAILY EXCEPT ON MONDAYS AND THURSDAYS TAKE 1 AND 1/2 TABLETS. (Patient taking differently: TAKE 1 TABLET BY MOUTH DAILY EXCEPT ON MONDAYS  TAKE 1 AND 1/2 TABLETS.) 120 tablet 0   No current facility-administered medications for this visit.     Allergies as of 11/03/2015 - Review Complete 11/03/2015  Allergen Reaction Noted  . Fexofenadine hcl Palpitations 12/05/2011    Vitals: BP 142/82 mmHg  Pulse 86  Resp 20  Ht 5\' 5"  (1.651 m)  Wt 149 lb (67.586 kg)  BMI 24.79 kg/m2 Last Weight:  Wt Readings from Last 1 Encounters:  11/03/15 149 lb (67.586 kg)   [id number] mass index is 24.79 kg/(m^2).     Last Height:   Ht Readings from Last 1 Encounters:  11/03/15 5\' 5"  (1.651 m)    Physical exam:  General: The patient is awake, alert and appears not in acute distress. The patient is well groomed. Head: Normocephalic, atraumatic. Neck is supple. Mallampati 3  neck circumference: 15.25 . Nasal airflow unrestricted. Retrognathia is seen.  Cardiovascular:  Regular rate and rhythm, without murmurs or carotid bruit, and without distended neck veins. Respiratory: Lungs are clear to auscultation. Skin:  Without evidence of edema, or rash Trunk: BMI is low.The patient's posture is erect  Neurologic exam : The patient is awake and alert,oriented to place and time.   Memory subjective  described as intact.  Attention span & concentration ability appears normal.  Speech is fluent,  without dysarthria, dysphonia or aphasia.  Mood and affect are appropriate.  Cranial nerves: Pupils are equal and briskly reactive to light.  Extraocular movements  in vertical and horizontal planes intact and without nystagmus. Visual fields by finger perimetry are intact. Hearing to finger rub intact. Facial sensation intact to fine touch. Facial motor strength is symmetric and tongue and uvula move midline. Shoulder shrug was symmetrical.   The patient was advised of the nature of the diagnosed sleep disorder , the treatment options and risks for general a health and wellness arising from not treating the condition.  I spent more than 25 minutes of face to face time with the patient.  Greater than 50% of time was spent in counseling and coordination of  care. We have discussed the diagnosis and differential and I answered the patient's questions.     Assessment:  After physical and neurologic examination, review of laboratory studies,  Personal review of imaging studies, reports of other /same  Imaging studies ,  Results of polysomnography/ neurophysiology testing and pre-existing records as far as provided in visit., my assessment is   1)  Johnny Reynolds is using a simple his fullface mask and large size. He initially had trouble using it and developed some pressure marks but is now gotten adjusted to it. He does have a mustache and chin beard. He has gotten used to it and likes it. A FFM was the patients preference.   RV in  6 month.   ICarmen Joedy Eickhoff MD  11/03/2015   CC: Sharilyn Sites, [address] [place], [place] [address]

## 2015-11-20 ENCOUNTER — Encounter: Payer: Self-pay | Admitting: Internal Medicine

## 2015-11-20 ENCOUNTER — Ambulatory Visit (INDEPENDENT_AMBULATORY_CARE_PROVIDER_SITE_OTHER): Payer: Medicare Other | Admitting: Internal Medicine

## 2015-11-20 VITALS — BP 116/66 | HR 82 | Ht 66.0 in | Wt 149.0 lb

## 2015-11-20 DIAGNOSIS — R001 Bradycardia, unspecified: Secondary | ICD-10-CM | POA: Diagnosis not present

## 2015-11-20 DIAGNOSIS — I480 Paroxysmal atrial fibrillation: Secondary | ICD-10-CM | POA: Diagnosis not present

## 2015-11-20 LAB — CUP PACEART INCLINIC DEVICE CHECK
Battery Remaining Longevity: 16 mo
Battery Voltage: 2.7 V
Brady Statistic AP VP Percent: 0.4 %
Brady Statistic AS VP Percent: 0.1 % — CL
Date Time Interrogation Session: 20170330084841
Implantable Lead Implant Date: 20080902
Implantable Lead Model: 5076
Implantable Lead Model: 5076
Lead Channel Impedance Value: 620 Ohm
Lead Channel Pacing Threshold Amplitude: 0.5 V
Lead Channel Sensing Intrinsic Amplitude: 31.36 mV
Lead Channel Setting Pacing Amplitude: 1.5 V
Lead Channel Setting Pacing Pulse Width: 0.4 ms
MDC IDC LEAD IMPLANT DT: 20080902
MDC IDC LEAD LOCATION: 753859
MDC IDC LEAD LOCATION: 753860
MDC IDC MSMT BATTERY IMPEDANCE: 3053 Ohm
MDC IDC MSMT LEADCHNL RA PACING THRESHOLD PULSEWIDTH: 0.4 ms
MDC IDC MSMT LEADCHNL RV IMPEDANCE VALUE: 554 Ohm
MDC IDC MSMT LEADCHNL RV PACING THRESHOLD AMPLITUDE: 0.75 V
MDC IDC MSMT LEADCHNL RV PACING THRESHOLD PULSEWIDTH: 0.4 ms
MDC IDC SET LEADCHNL RV PACING AMPLITUDE: 2 V
MDC IDC SET LEADCHNL RV SENSING SENSITIVITY: 5.6 mV
MDC IDC STAT BRADY AP VS PERCENT: 99.6 %
MDC IDC STAT BRADY AS VS PERCENT: 0.1 % — AB

## 2015-11-20 NOTE — Patient Instructions (Signed)
Your physician wants you to follow-up in: 1 Year with Dr. Lovena Le. You will receive a reminder letter in the mail two months in advance. If you don't receive a letter, please call our office to schedule the follow-up appointment.  Remote monitoring is used to monitor your Pacemaker of ICD from home. This monitoring reduces the number of office visits required to check your device to one time per year. It allows [place] to keep an eye on the functioning of your device to ensure it is working properly. You are scheduled for a device check from home on 02/18/16. You may send your transmission at any time that day. If you have a wireless device, the transmission will be sent automatically. After your physician reviews your transmission, you will receive a postcard with your next transmission date.  Your physician recommends that you continue on your current medications as directed. Please refer to the Current Medication list given to you today.  If you need a refill on your cardiac medications before your next appointment, please call your pharmacy.  Thank you for choosing [facility]!

## 2015-11-20 NOTE — Progress Notes (Signed)
HPI [name] returns today for ongoing evaluation and management of PAF, symptomatic bradycardia and s/p PPM. The patient feels well and has been stable. He'll on history of coronary artery disease with multiple angioplasties and stents. He currently denies anginal symptoms. He has never had a myocardial infarction. He has no congestive heart failure symptoms and only rare palpitations. 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  Allergies  Allergen Reactions  . Fexofenadine Hcl Palpitations    Allegra     Current Outpatient Prescriptions  Medication Sig Dispense Refill  . aspirin 81 MG tablet Take 81 mg by mouth 2 (two) times daily.     Marland Kitchen atorvastatin (LIPITOR) 80 MG tablet TAKE ONE TABLET BY MOUTH DAILY. 90 tablet 1  . diltiazem (CARDIZEM CD) 240 MG 24 hr capsule Take 240 mg by mouth daily.     Marland Kitchen levothyroxine (SYNTHROID, LEVOTHROID) 75 MCG tablet Take 75 mcg by mouth daily before breakfast.    . losartan (COZAAR) 50 MG tablet Take 50 mg by mouth daily.     . saw palmetto 500 MG capsule Take 500 mg by mouth daily.    . sotalol (BETAPACE) 160 MG tablet TAKE (1) TABLET BY MOUTH TWICE DAILY. 180 tablet 3  . warfarin (COUMADIN) 5 MG tablet TAKE 1 TABLET BY MOUTH DAILY EXCEPT ON MONDAYS AND THURSDAYS TAKE 1 AND 1/2 TABLETS. (Patient taking differently: TAKE 1 TABLET BY MOUTH DAILY EXCEPT ON MONDAYS  TAKE 1 AND 1/2 TABLETS.) 120 tablet 0   No current facility-administered medications for this visit.     Past Medical History  Diagnosis Date  . Hypertension   . Back pain, chronic   . Arrhythmia   . High cholesterol   . AAA (abdominal aortic aneurysm) ([place])   . Atrial fibrillation ([place])   . Left shoulder pain 2014  . Sleep apnea Sept. 2008    USES C-PAP  . Coronary artery disease 1996 and 1997    angioplasty of LAD  . 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   Past Surgical History  Procedure Laterality Date  . Pacemaker insertion  2008    DUAL-CHAMBER  . Coronary stent placement  1996  . Cataract extraction  1983  . Eye surgery  1983    Cataract  . Hernia repair Bilateral 2000  . Colonoscopy N/A 09/04/2014    Procedure: COLONOSCOPY;  Surgeon: [name], MD;  Location: [facility];  Service: Endoscopy;  Laterality: N/A;  1030  . Doppler echocardiography  04/17/2010    EF =50-55%; mild concentric LVH,LAE, AORTIC SCLEROSIS.TRACE TO MILD CENTRAL AI, TRACE MITRAL REGURGITATION, CANNOT ASSESS DIASTOLIC FUNCTIONDUE TO UNDERLYING RHYTHM,PACERMAKER WIRE IN THE RA/RV  . Doppler echocardiography  04/21/2007    DONE IN [place]-- MILD MITRAL REGURG., MILD TRICUSPID REGURG. WITH MILD PULM. HTN, MILD CONCENTRIC LEFT VENTRICULAR HYPERTROPHY,MILD LEFT ATRIAL ENLARGEMENT.  Marland Kitchen Nm myocar multiple w/spect  04/17/2010    EF 50%; LOW NORMAL LV FUNCTION,  . Cardiac catheterization  1996  . Cardiac catheterization  06/18/1996    mid-circ tandem overlying stents with 20%,70%mid-LAD naroowin stent site in circ with 70% stenosis in the mid to distal marginal branch,tandem 95% and 80% stenosis in the proximal portion of the posterior descending branch of RCA   . Coronary angioplasty with stent placement  08/18/1995    PTA/STENT CIRC  . Coronary angioplasty with stent placement  06/18/1996    PTCA  posterior descending  branch of the RCA 95 AND 80% TO LESSTHAN 20%  . Insert / replace / remove pacemaker  SEPT 2008    INSERT DUAL -CHAMBER-medtronic adapta ADDR01 serial   [id number]     Family History  Problem Relation Age of Onset  . Peripheral vascular disease Mother   . Hypertension Mother   . Diabetes Mother   . Heart disease Mother   . Hyperlipidemia Mother   . Peripheral vascular disease Sister   . Cancer Sister   . Diabetes Sister   . Heart disease Sister     Stent- Heart Disease before age 76  . Hyperlipidemia Sister   .  Hypertension Sister   . Peripheral vascular disease Brother   . Diabetes Brother   . Heart disease Brother     CHF and Heart Disease before age 19  . Hyperlipidemia Brother   . Hypertension Brother   . Stroke Father 53    TIA     Social History   Social History  . Marital Status: Married    Spouse Name: N/A  . Number of Children: N/A  . Years of Education: N/A   Occupational History  . Not on file.   Social History Main Topics  . Smoking status: Never Smoker   . Smokeless tobacco: Never Used  . Alcohol Use: No  . Drug Use: No  . Sexual Activity: Not on file   Other Topics Concern  . Not on file   Social History Narrative   Controls his diabetes through diet and exercise.     BP 116/66 mmHg  Pulse 82  Ht 5\' 6"  (1.676 m)  Wt 149 lb (67.586 kg)  BMI 24.06 kg/m2  SpO2 98%  Physical Exam:  Well appearing 75 year old man, NAD HEENT: Unremarkable Neck:  6 cm JVD, no thyromegally Back:  No CVA tenderness Lungs:  Clear with no wheezes, rales, or rhonchi. HEART:  Regular rate rhythm, no murmurs, no rubs, no clicks Abd:  soft, positive bowel sounds, no organomegally, no rebound, no guarding Ext:  2 plus pulses, no edema, no cyanosis, no clubbing Skin:  No rashes no nodules Neuro:  CN II through XII intact, motor grossly intact   DEVICE  Normal device function.  See PaceArt for details.   Assess/Plan: 1. Sinus node dysfunction - he is doing well and asymptomatic, s/p PPM 2. HTN - his blood pressure is well controlled. Will follow. 3. PPM - he has about 16 months on his PPM battery. His Medtronic DDD device is working normally. Will recheck in several months.

## 2015-11-26 ENCOUNTER — Other Ambulatory Visit: Payer: Self-pay | Admitting: Cardiology

## 2015-12-03 ENCOUNTER — Ambulatory Visit (INDEPENDENT_AMBULATORY_CARE_PROVIDER_SITE_OTHER): Payer: Medicare Other | Admitting: *Deleted

## 2015-12-03 DIAGNOSIS — I4891 Unspecified atrial fibrillation: Secondary | ICD-10-CM | POA: Diagnosis not present

## 2015-12-03 DIAGNOSIS — Z5181 Encounter for therapeutic drug level monitoring: Secondary | ICD-10-CM | POA: Diagnosis not present

## 2015-12-03 DIAGNOSIS — Z7901 Long term (current) use of anticoagulants: Secondary | ICD-10-CM

## 2015-12-03 LAB — POCT INR: INR: 1.7

## 2015-12-24 ENCOUNTER — Ambulatory Visit (INDEPENDENT_AMBULATORY_CARE_PROVIDER_SITE_OTHER): Payer: Medicare Other | Admitting: *Deleted

## 2015-12-24 DIAGNOSIS — Z5181 Encounter for therapeutic drug level monitoring: Secondary | ICD-10-CM

## 2015-12-24 DIAGNOSIS — Z7901 Long term (current) use of anticoagulants: Secondary | ICD-10-CM | POA: Diagnosis not present

## 2015-12-24 DIAGNOSIS — I4891 Unspecified atrial fibrillation: Secondary | ICD-10-CM | POA: Diagnosis not present

## 2015-12-24 LAB — POCT INR: INR: 2.5

## 2016-01-21 ENCOUNTER — Ambulatory Visit (INDEPENDENT_AMBULATORY_CARE_PROVIDER_SITE_OTHER): Payer: Medicare Other | Admitting: *Deleted

## 2016-01-21 DIAGNOSIS — Z5181 Encounter for therapeutic drug level monitoring: Secondary | ICD-10-CM

## 2016-01-21 DIAGNOSIS — Z7901 Long term (current) use of anticoagulants: Secondary | ICD-10-CM

## 2016-01-21 DIAGNOSIS — I4891 Unspecified atrial fibrillation: Secondary | ICD-10-CM | POA: Diagnosis not present

## 2016-01-21 LAB — POCT INR: INR: 2.6

## 2016-02-09 ENCOUNTER — Other Ambulatory Visit: Payer: Self-pay | Admitting: Cardiology

## 2016-02-16 ENCOUNTER — Ambulatory Visit (INDEPENDENT_AMBULATORY_CARE_PROVIDER_SITE_OTHER): Payer: Medicare Other | Admitting: Cardiology

## 2016-02-16 ENCOUNTER — Ambulatory Visit (INDEPENDENT_AMBULATORY_CARE_PROVIDER_SITE_OTHER): Payer: Medicare Other | Admitting: *Deleted

## 2016-02-16 ENCOUNTER — Encounter: Payer: Self-pay | Admitting: Cardiology

## 2016-02-16 VITALS — BP 150/80 | HR 80 | Ht 66.0 in | Wt 145.0 lb

## 2016-02-16 DIAGNOSIS — E782 Mixed hyperlipidemia: Secondary | ICD-10-CM | POA: Diagnosis not present

## 2016-02-16 DIAGNOSIS — I2510 Atherosclerotic heart disease of native coronary artery without angina pectoris: Secondary | ICD-10-CM | POA: Diagnosis not present

## 2016-02-16 DIAGNOSIS — I4891 Unspecified atrial fibrillation: Secondary | ICD-10-CM

## 2016-02-16 DIAGNOSIS — I480 Paroxysmal atrial fibrillation: Secondary | ICD-10-CM

## 2016-02-16 DIAGNOSIS — Z7901 Long term (current) use of anticoagulants: Secondary | ICD-10-CM

## 2016-02-16 DIAGNOSIS — I10 Essential (primary) hypertension: Secondary | ICD-10-CM

## 2016-02-16 DIAGNOSIS — Z5181 Encounter for therapeutic drug level monitoring: Secondary | ICD-10-CM

## 2016-02-16 LAB — POCT INR: INR: 2.8

## 2016-02-16 NOTE — Progress Notes (Signed)
Clinical Summary Johnny Reynolds is a 75 y.o.male seen today for follow up of the following medical problems  1. AAA  - followed by vascular - denies any abdominal symptoms. Has f/u 10/2016.  2. HTN  - checks at home regularly, typically 120/70s. He has had some white coat HTN in the past  - compliant with meds   3. Parox Afib  -on sotalol 160 mg bid, on approx since 2010. Also on dilt 240 mg qday  - no palpitations, no SOB, no DOE  - Has not been interested in NOACs.  -denies any recent palpitaitons. No bleeding troubles on coumadin.   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  - normal pacemaker check 10/2015  5. CAD  -remote history per old clinic notes, prior angioplasty in 1996 and 1997 at [facility]   - no recent chest pain, no SOB or DOE    6. Hyperlipidemia  - compliant with lipitor - no recent labs in our system  7. 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  2 grand daugthers just graduated from high school. Both are heading to college, one to [employer] and the other to a local school in [place].  Past Medical History  Diagnosis Date  . Hypertension   . Back pain, chronic   . Arrhythmia   . High cholesterol   . AAA (abdominal aortic aneurysm) ([place])   . Atrial fibrillation ([place])   . Left shoulder pain 2014  . Sleep apnea Sept. 2008    USES C-PAP  . Coronary artery disease 1996 and 1997    angioplasty of LAD  . 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 Fexofenadine Hcl Palpitations    Allegra     Current Outpatient Prescriptions  Medication Sig Dispense Refill  . aspirin 81 MG tablet Take 81 mg by mouth 2 (two) times daily.     Marland Kitchen atorvastatin (LIPITOR) 80 MG tablet TAKE ONE  TABLET BY MOUTH DAILY. 90 tablet 1  . diltiazem (CARDIZEM CD) 240 MG 24 hr capsule Take 240 mg by mouth daily.     Marland Kitchen levothyroxine (SYNTHROID, LEVOTHROID) 75 MCG tablet Take 75 mcg by mouth daily before breakfast.    . losartan (COZAAR) 50 MG tablet TAKE 1 TABLET BY MOUTH ONCE DAILY. 90 tablet 1  . saw palmetto 500 MG capsule Take 500 mg by mouth daily.    . sotalol (BETAPACE) 160 MG tablet TAKE (1) TABLET BY MOUTH TWICE DAILY. 180 tablet 3  . warfarin (COUMADIN) 5 MG tablet TAKE 1 TABLET BY MOUTH DAILY EXCEPT ON MONDAYS AND THURSDAYS TAKE 1 AND 1/2 TABLETS. (Patient taking differently: TAKE 1 TABLET BY MOUTH DAILY EXCEPT ON MONDAYS  TAKE 1 AND 1/2 TABLETS.) 120 tablet 0   No current facility-administered medications for this visit.     Past Surgical History  Procedure Laterality Date  . Pacemaker insertion  2008    DUAL-CHAMBER  . Coronary stent placement  1996  . Cataract extraction  1983  . Eye surgery  1983    Cataract  . Hernia repair Bilateral 2000  . Colonoscopy N/A 09/04/2014    Procedure: COLONOSCOPY;  Surgeon: [name], MD;  [facility];  Service:  Endoscopy;  Laterality: N/A;  1030  . Doppler echocardiography  04/17/2010    EF =50-55%; mild concentric LVH,LAE, AORTIC SCLEROSIS.TRACE TO MILD CENTRAL AI, TRACE MITRAL REGURGITATION, CANNOT ASSESS DIASTOLIC FUNCTIONDUE TO UNDERLYING RHYTHM,PACERMAKER WIRE IN THE RA/RV  . Doppler echocardiography  04/21/2007    DONE IN [place]-- MILD MITRAL REGURG., MILD TRICUSPID REGURG. WITH MILD PULM. HTN, MILD CONCENTRIC LEFT VENTRICULAR HYPERTROPHY,MILD LEFT ATRIAL ENLARGEMENT.  Marland Kitchen Nm myocar multiple w/spect  04/17/2010    EF 50%; LOW NORMAL LV FUNCTION,  . Cardiac catheterization  1996  . Cardiac catheterization  06/18/1996    mid-circ tandem overlying stents with 20%,70%mid-LAD naroowin stent site in circ with 70% stenosis in the mid to distal marginal Deundra Bard,tandem 95% and 80% stenosis in the proximal portion of the  posterior descending Sandria Mcenroe of RCA   . Coronary angioplasty with stent placement  08/18/1995    PTA/STENT CIRC  . Coronary angioplasty with stent placement  06/18/1996    PTCA  posterior descending  Syrus Nakama of the RCA 95 AND 80% TO LESSTHAN 20%  . Insert / replace / remove pacemaker  SEPT 2008    INSERT DUAL -CHAMBER-medtronic adapta ADDR01 serial   [id number]     Allergies  Allergen Reactions  . Fexofenadine Hcl Palpitations    Allegra      Family History  Problem Relation Age of Onset  . Peripheral vascular disease Mother   . Hypertension Mother   . Diabetes Mother   . Heart disease Mother   . Hyperlipidemia Mother   . Peripheral vascular disease Sister   . Cancer Sister   . Diabetes Sister   . Heart disease Sister     Stent- Heart Disease before age 22  . Hyperlipidemia Sister   . Hypertension Sister   . Peripheral vascular disease Brother   . Diabetes Brother   . Heart disease Brother     CHF and Heart Disease before age 8  . Hyperlipidemia Brother   . Hypertension Brother   . Stroke Father 77    TIA     Social History Johnny Reynolds reports that he has never smoked. He has never used smokeless tobacco. Johnny Reynolds reports that he does not drink alcohol.   Review of Systems CONSTITUTIONAL: No weight loss, fever, chills, weakness or fatigue.  HEENT: Eyes: No visual loss, blurred vision, double vision or yellow sclerae.No hearing loss, sneezing, congestion, runny nose or sore throat.  SKIN: No rash or itching.  CARDIOVASCULAR: per HPI RESPIRATORY: No shortness of breath, cough or sputum.  GASTROINTESTINAL: No anorexia, nausea, vomiting or diarrhea. No abdominal pain or blood.  GENITOURINARY: No burning on urination, no polyuria NEUROLOGICAL: No headache, dizziness, syncope, paralysis, ataxia, numbness or tingling in the extremities. No change in bowel or bladder control.  MUSCULOSKELETAL: No muscle, back pain, joint pain or stiffness.  LYMPHATICS: No enlarged  nodes. No history of splenectomy.  PSYCHIATRIC: No history of depression or anxiety.  ENDOCRINOLOGIC: No reports of sweating, cold or heat intolerance. No polyuria or polydipsia.  Marland Kitchen   Physical Examination Filed Vitals:   02/16/16 0855  BP: 150/80  Pulse: 80   Filed Vitals:   02/16/16 0855  Height: 5\' 6"  (1.676 m)  Weight: 145 lb (65.772 kg)    Gen: resting comfortably, no acute distress HEENT: no scleral icterus, pupils equal round and reactive, no palptable cervical adenopathy,  CV: RRR, no m/r/g,no jvd Resp: Clear to auscultation bilaterally GI: abdomen is soft, non-tender, non-distended, normal bowel sounds, no hepatosplenomegaly  MSK: extremities are warm, no edema.  Skin: warm, no rash Neuro:  no focal deficits Psych: appropriate affect   Diagnostic Studies Echo 03/2010 [facility]: LVEF 50-55%, mild LVH, LAE   03/2010 [facility]: no ischemia, LVEF 50%.    02/06/14 EKG Atrial paced, QTc 490    Assessment and Plan  1. AAA: stable by most recent US,  -continue to follow w/ vascular.   2. CAD  - no current symptoms -continue current meds  3. Parox afib  - no symptoms. EKG in clinic shows NSR - continue current meds. CHAD2Vasc score of 4.   4. Tachy-brady syndrome  - no current symptoms, normal device function on last check - continue to follow in device clinic  5. HL: - continue high dose statin in setting of CAD - request labs from pcp  6. HTN - at goal, he will continue current meds  F/u 6 months      Arnoldo Lenis, M.D.

## 2016-02-16 NOTE — Patient Instructions (Signed)
Your physician wants you to follow-up in: 6 months with Dr Branch You will receive a reminder letter in the mail two months in advance. If you don't receive a letter, please call our office to schedule the follow-up appointment.     Your physician recommends that you continue on your current medications as directed. Please refer to the Current Medication list given to you today.     If you need a refill on your cardiac medications before your next appointment, please call your pharmacy.     Thank you for choosing [facility] !        

## 2016-02-19 ENCOUNTER — Ambulatory Visit (INDEPENDENT_AMBULATORY_CARE_PROVIDER_SITE_OTHER): Payer: Medicare Other | Admitting: *Deleted

## 2016-02-19 DIAGNOSIS — I495 Sick sinus syndrome: Secondary | ICD-10-CM

## 2016-02-19 NOTE — Progress Notes (Signed)
Remote pacemaker transmission.   

## 2016-02-20 ENCOUNTER — Encounter: Payer: Self-pay | Admitting: Cardiology

## 2016-02-20 LAB — CUP PACEART REMOTE DEVICE CHECK
Battery Impedance: 3532 Ohm
Battery Voltage: 2.68 V
Brady Statistic AP VP Percent: 0 %
Brady Statistic AP VS Percent: 100 %
Brady Statistic AS VP Percent: 0 %
Implantable Lead Implant Date: 20080902
Implantable Lead Location: 753859
Implantable Lead Model: 5076
Implantable Lead Model: 5076
Lead Channel Impedance Value: 528 Ohm
Lead Channel Impedance Value: 610 Ohm
Lead Channel Pacing Threshold Amplitude: 0.5 V
Lead Channel Pacing Threshold Pulse Width: 0.4 ms
Lead Channel Setting Pacing Amplitude: 2 V
MDC IDC LEAD IMPLANT DT: 20080902
MDC IDC LEAD LOCATION: 753860
MDC IDC MSMT BATTERY REMAINING LONGEVITY: 13 mo
MDC IDC MSMT LEADCHNL RV PACING THRESHOLD AMPLITUDE: 0.875 V
MDC IDC MSMT LEADCHNL RV PACING THRESHOLD PULSEWIDTH: 0.4 ms
MDC IDC MSMT LEADCHNL RV SENSING INTR AMPL: 16 mV
MDC IDC SESS DTM: 20170629124127
MDC IDC SET LEADCHNL RA PACING AMPLITUDE: 1.5 V
MDC IDC SET LEADCHNL RV PACING PULSEWIDTH: 0.4 ms
MDC IDC SET LEADCHNL RV SENSING SENSITIVITY: 5.6 mV
MDC IDC STAT BRADY AS VS PERCENT: 0 %

## 2016-03-23 ENCOUNTER — Other Ambulatory Visit: Payer: Self-pay | Admitting: Cardiology

## 2016-03-29 ENCOUNTER — Ambulatory Visit (INDEPENDENT_AMBULATORY_CARE_PROVIDER_SITE_OTHER): Payer: Medicare Other | Admitting: *Deleted

## 2016-03-29 DIAGNOSIS — I4891 Unspecified atrial fibrillation: Secondary | ICD-10-CM | POA: Diagnosis not present

## 2016-03-29 DIAGNOSIS — Z7901 Long term (current) use of anticoagulants: Secondary | ICD-10-CM | POA: Diagnosis not present

## 2016-03-29 DIAGNOSIS — Z5181 Encounter for therapeutic drug level monitoring: Secondary | ICD-10-CM

## 2016-03-29 LAB — POCT INR: INR: 2

## 2016-04-15 ENCOUNTER — Encounter: Payer: Self-pay | Admitting: Cardiology

## 2016-05-05 ENCOUNTER — Ambulatory Visit: Payer: Medicare Other | Admitting: Neurology

## 2016-05-05 ENCOUNTER — Telehealth: Payer: Self-pay

## 2016-05-05 NOTE — Telephone Encounter (Signed)
I spoke to pt and advised him that his appt for today with Dr. Brett Fairy needs to be cancelled because she is out of the office. Pt is agreeable to an appt on 05/26/2016 at 1pm. Pt verbalized understanding.

## 2016-05-10 DIAGNOSIS — I2510 Atherosclerotic heart disease of native coronary artery without angina pectoris: Secondary | ICD-10-CM | POA: Insufficient documentation

## 2016-05-12 ENCOUNTER — Encounter: Payer: Self-pay | Admitting: *Deleted

## 2016-05-20 ENCOUNTER — Telehealth: Payer: Self-pay | Admitting: Cardiology

## 2016-05-20 ENCOUNTER — Ambulatory Visit (INDEPENDENT_AMBULATORY_CARE_PROVIDER_SITE_OTHER): Payer: Medicare Other | Admitting: *Deleted

## 2016-05-20 DIAGNOSIS — I495 Sick sinus syndrome: Secondary | ICD-10-CM | POA: Diagnosis not present

## 2016-05-20 NOTE — Telephone Encounter (Signed)
LMOVM reminding pt to send remote transmission.   

## 2016-05-21 ENCOUNTER — Other Ambulatory Visit: Payer: Self-pay | Admitting: Cardiology

## 2016-05-21 NOTE — Progress Notes (Signed)
Remote pacemaker transmission.   

## 2016-05-25 ENCOUNTER — Encounter: Payer: Self-pay | Admitting: Cardiology

## 2016-05-25 ENCOUNTER — Other Ambulatory Visit: Payer: Self-pay | Admitting: Cardiology

## 2016-05-26 ENCOUNTER — Ambulatory Visit (INDEPENDENT_AMBULATORY_CARE_PROVIDER_SITE_OTHER): Payer: Medicare Other | Admitting: Neurology

## 2016-05-26 ENCOUNTER — Encounter: Payer: Self-pay | Admitting: Cardiology

## 2016-05-26 ENCOUNTER — Encounter: Payer: Self-pay | Admitting: Neurology

## 2016-05-26 VITALS — BP 130/60 | HR 96 | Resp 20 | Ht 66.0 in | Wt 154.0 lb

## 2016-05-26 DIAGNOSIS — I2510 Atherosclerotic heart disease of native coronary artery without angina pectoris: Secondary | ICD-10-CM

## 2016-05-26 DIAGNOSIS — H04551 Acquired stenosis of right nasolacrimal duct: Secondary | ICD-10-CM

## 2016-05-26 DIAGNOSIS — G4733 Obstructive sleep apnea (adult) (pediatric): Secondary | ICD-10-CM

## 2016-05-26 DIAGNOSIS — Z9989 Dependence on other enabling machines and devices: Secondary | ICD-10-CM

## 2016-05-26 NOTE — Progress Notes (Signed)
SLEEP MEDICINE CLINIC   Provider:  Larey Seat, M D  Referring Provider: Sharilyn Sites, MD Primary Care Physician:  Purvis Kilts, MD  Chief Complaint  Patient presents with  . Follow-up    cpap, going well    Chief complaint according to patient : " I like an alternative to CPAP"   HPI:  Johnny Reynolds is a 75 y.o. male , seen here as a referral from Dr. Hilma Reynolds for re initiation of sleep therapy and supplies,  Johnny Reynolds reports that in the autumn of 2008 he underwent a sleep study and was diagnosed with sleep related hypoxemia. He did have a significant amount of apnea but was prescribed CPAP in an attempt to rise the 02 nadir. He was able to sleep supine when he used his CPAP broke up at supine and actually felt rested and restored. He was prescribed a ResMed CPAP machine but his supplies of Theme park manager. He had been tested and diagnosed in [place], [place]. He had no new supplies for several years. He finally got some today- samples from [place] [facility]. He reports he uses a full face mask covering nose and mouth but he also has a mustache and chin beard which will interfere with his air seal. Sleep habits are as follows: He falls asleep on his back he wakes up supine as well. His bedroom is cool, quiet and dark. He shares the room with his spouse.  The patient usually goes to bed around 10:30 he will be asleep rather promptly. He wakes up between 6 and 7 AM and limits his water intake to not have nocturia interrupting his sleep. He reports no headaches in the morning nor waking him from sleep, he feels usually refreshed and restored in the morning. He has not been retested over 8 years.  Sleep medical history and family sleep history:  No history of sleep walking or night terrors. Brother is a sleep walker.   PMHx: Diet controlled diabetes, hypertension controlled on 3 medications hypercholesterolemia, aspirin daily. L-thyroxine for hypothyroidism daily.  Synthroid daily. Social history: married, Music therapist, retired Warden/ranger. Non smoker, non drinker.  Caffeine: rare   11-03-15 Johnny Reynolds is here today to follow-up on his recent sleep studies. He underwent first a split-night polysomnography but unfortunately was not sufficiently titrated. This took place on 08/01/2015 he was diagnosed with a very high apnea hypotony index of 62.6 his lowest oxygen saturation was 73% the desaturation index was 57.4 he just scraped by under 30 minutes of desaturation and he did not retain CO2. The attempt to titrated to CPAP did not work out well there were only 2.9 minutes of sleep recorded at the final setting of 10 cm water pressure. In addition I asked for an oxygen titration showed CPAP not work in a full night study. The second study showed CPAP at 11 cm water to be effective with an AHI of 0.0 and was 68 minutes of sleep and 26.5 minutes of REM sleep at the setting. The EKG noted sinus rhythm and there was no longer oxygen desaturation seen.  Allergies Mr. Maxillary reports that he has excellent sleep quality no endorsed again the Epworth score at 0 points, fatigue severity at 9 points and the geriatric depression score at 0 points I was able to obtain a download of his CPAP machine he has used the machine 100% of the last 30 days and each night over 4 hours. This is the highest possible  compliance with an average of 7 hours and 50 minutes. He is using AutoSet between 5 and 12 cm water the 91st percentile is 11.9 cm. He sleeps supine only.  He has one nocturia break at night. He noted he dreams again. His residual AHI is 11.2 . Baseline was 62.6 AHI.    Interval history from 05/26/2016. I have the pleasure of seeing Johnny Reynolds today who has been 100% compliant user of his CPAP AutoSet. Pressure window is between 5 and 12 was 3 cm EPR residual AHI is 6.8 and nearly all residual events are obstructive in nature. His 95th percentile pressure is 12 cm.  For this reason I would like to raise the upper pressure window by 2 cm to allow room to go. He has a surgery on Friday for tearduct opening. He was taken off aspirin, and is concerned that the mask will aggravate the post operative  lesion. He is using a FFM. He has little air leak.   Review of Systems:  Out of a complete 14 system review, the patient complains of only the following sther reviewed systems are negativ and sleeps supe.  Epworth score 0 , Fatigue severity score 0  , depression score 0.   Social History   Social History  . Marital status: Married    Spouse name: N/A  . Number of children: N/A  . Years of education: N/A   Occupational History  . Not on file.   Social History Main Topics  . Smoking status: Never Smoker  . Smokeless tobacco: Never Used  . Alcohol use No  . Drug use: No  . Sexual activity: Not on file   Other Topics Concern  . Not on file   Social History Narrative   Controls his diabetes through diet and exercise.    Family History  Problem Relation Age of Onset  . Peripheral vascular disease Mother   . Hypertension Mother   . Diabetes Mother   . Heart disease Mother   . Hyperlipidemia Mother   . Peripheral vascular disease Sister   . Cancer Sister   . Diabetes Sister   . Heart disease Sister     Stent- Heart Disease before age 98  . Hyperlipidemia Sister   . Hypertension Sister   . Peripheral vascular disease Brother   . Diabetes Brother   . Heart disease Brother     CHF and Heart Disease before age 75  . Hyperlipidemia Brother   . Hypertension Brother   . Stroke Father 42    TIA    Past Medical History:  Diagnosis Date  . AAA (abdominal aortic aneurysm) ([place])   . Arrhythmia   . Atrial fibrillation ([place])   . Back pain, chronic   . Coronary artery disease 1996 and 1997   angioplasty of LAD  . Diabetes mellitus without complication ([place])    MILD TYPE 2  . High cholesterol   . Hypertension   . Left shoulder pain 2014  .  Sleep apnea Sept. 2008   USES C-PAP    Past Surgical History:  Procedure Laterality Date  . CARDIAC CATHETERIZATION  1996  . CARDIAC CATHETERIZATION  06/18/1996   mid-circ tandem overlying stents with 20%,70%mid-LAD naroowin stent site in circ with 70% stenosis in the mid to distal marginal branch,tandem 95% and 80% stenosis in the proximal portion of the posterior descending branch of RCA   . CATARACT EXTRACTION  1983  . COLONOSCOPY N/A 09/04/2014   Procedure: COLONOSCOPY;  Surgeon:  [name], MD;  [facility];  Service: Endoscopy;  Laterality: N/A;  1030  . CORONARY ANGIOPLASTY WITH STENT PLACEMENT  08/18/1995   PTA/STENT CIRC  . CORONARY ANGIOPLASTY WITH STENT PLACEMENT  06/18/1996   PTCA  posterior descending  branch of the RCA 95 AND 80% TO LESSTHAN 20%  . [place]  . DOPPLER ECHOCARDIOGRAPHY  04/17/2010   EF =50-55%; mild concentric LVH,LAE, AORTIC SCLEROSIS.TRACE TO MILD CENTRAL AI, TRACE MITRAL REGURGITATION, CANNOT ASSESS DIASTOLIC FUNCTIONDUE TO UNDERLYING RHYTHM,PACERMAKER WIRE IN THE RA/RV  . DOPPLER ECHOCARDIOGRAPHY  04/21/2007   DONE IN [place]-- MILD MITRAL REGURG., MILD TRICUSPID REGURG. WITH MILD PULM. HTN, MILD CONCENTRIC LEFT VENTRICULAR HYPERTROPHY,MILD LEFT ATRIAL ENLARGEMENT.  . EYE SURGERY  1983   Cataract  . HERNIA REPAIR Bilateral 2000  . INSERT / REPLACE / REMOVE PACEMAKER  SEPT 2008   INSERT DUAL -CHAMBER-medtronic adapta ADDR01 serial   [id number]  . NM MYOCAR MULTIPLE W/SPECT  04/17/2010   EF 50%; LOW NORMAL LV FUNCTION,  . PACEMAKER INSERTION  2008   DUAL-CHAMBER    Current Outpatient Prescriptions  Medication Sig Dispense Refill  . aspirin 81 MG tablet Take 81 mg by mouth 2 (two) times daily.     Marland Kitchen atorvastatin (LIPITOR) 80 MG tablet TAKE ONE TABLET BY MOUTH DAILY. 90 tablet 3  . diltiazem (CARDIZEM CD) 240 MG 24 hr capsule Take 240 mg by mouth daily.     Marland Kitchen levothyroxine (SYNTHROID, LEVOTHROID) 75 MCG tablet  Take 75 mcg by mouth daily before breakfast.    . losartan (COZAAR) 50 MG tablet TAKE 1 TABLET BY MOUTH ONCE DAILY. 90 tablet 1  . saw palmetto 500 MG capsule Take 500 mg by mouth daily.    . sotalol (BETAPACE) 160 MG tablet TAKE (1) TABLET BY MOUTH TWICE DAILY. 180 tablet 3  . warfarin (COUMADIN) 5 MG tablet Take 1 tablet daily except 1 1/2 tablets on Thursdays 120 tablet 3   No current facility-administered medications for this visit.     Allergies as of 05/26/2016 - Review Complete 05/26/2016  Allergen Reaction Noted  . Fexofenadine hcl Palpitations 12/05/2011    Vitals: BP 130/60   Pulse 96   Resp 20   Ht 5\' 6"  (1.676 m)   Wt 154 lb (69.9 kg)   BMI 24.86 kg/m  Last Weight:  Wt Readings from Last 1 Encounters:  05/26/16 154 lb (69.9 kg)   [id number] mass index is 24.86 kg/m.     Last Height:   Ht Readings from Last 1 Encounters:  05/26/16 5\' 6"  (1.676 m)    Physical exam:  General: The patient is awake, alert and appears not in acute distress. The patient is well groomed. Head: Normocephalic, atraumatic. Neck is supple. Mallampati 3  neck circumference: 15.25 . Nasal airflow unrestricted. Retrognathia is seen.  Cardiovascular:  Regular rate and rhythm, without murmurs or carotid bruit, and without distended neck veins. Respiratory: Lungs are clear to auscultation. Skin:  Without evidence of edema, or rash Trunk: BMI is low.The patient's posture is erect  Neurologic exam : The patient is awake and alert,oriented to place and time.   Memory subjective  described as intact.  Attention span & concentration ability appears normal.  Speech is fluent,  without dysarthria, dysphonia or aphasia.  Mood and affect are appropriate.  Cranial nerves: Pupils are equal and briskly reactive to light.  Extraocular movements  in vertical and horizontal planes intact and without nystagmus. Visual  fields by finger perimetry are intact. Hearing to finger rub intact. Facial sensation  intact to fine touch. Facial motor strength is symmetric and tongue and uvula move midline. Shoulder shrug was symmetrical.   The patient was advised of the nature of the diagnosed sleep disorder , the treatment options and risks for general a health and wellness arising from not treating the condition.  I spent more than 25 minutes of face to face time with the patient. Greater than 50% of time was spent in counseling and coordination of care. We have discussed the diagnosis and differential and I answered the patient's questions.     Assessment:  After physical and neurologic examination, review of laboratory studies,  Personal review of imaging studies, reports of other /same  Imaging studies ,  Results of polysomnography/ neurophysiology testing and pre-existing records as far as provided in visit., my assessment is   1)  Johnny Reynolds is using a simple his fullface mask and large size. He initially had trouble using it and developed some pressure marks but is now gotten adjusted to it. He does have a mustache and chin beard. He has gotten used to it and likes it. A FFM was the patients preference.  The patient may temporarily use a nasal pillow mask to avoid pressure on the tear duct as he expects to have surgery and of this week. I will also increase the pressure of the AutoSet window to 14 cm water. If he doesn't need the higher pressure he will not have to use it. I gave him a ResMed nasal pillow P 10 with elastic mand to use for the first week or so after surgery. Medium-sized pillows  RV in 12 month.    ICarmen Kaileena Obi MD  05/26/2016   CC: Sharilyn Sites, [address] [place], [place] [address]

## 2016-05-26 NOTE — Patient Instructions (Signed)

## 2016-06-02 ENCOUNTER — Ambulatory Visit (INDEPENDENT_AMBULATORY_CARE_PROVIDER_SITE_OTHER): Payer: Medicare Other | Admitting: *Deleted

## 2016-06-02 DIAGNOSIS — Z5181 Encounter for therapeutic drug level monitoring: Secondary | ICD-10-CM

## 2016-06-02 DIAGNOSIS — I4891 Unspecified atrial fibrillation: Secondary | ICD-10-CM | POA: Diagnosis not present

## 2016-06-02 LAB — POCT INR: INR: 1.2

## 2016-06-09 ENCOUNTER — Encounter: Payer: Self-pay | Admitting: Cardiology

## 2016-06-14 ENCOUNTER — Ambulatory Visit (INDEPENDENT_AMBULATORY_CARE_PROVIDER_SITE_OTHER): Payer: Medicare Other | Admitting: *Deleted

## 2016-06-14 DIAGNOSIS — I4891 Unspecified atrial fibrillation: Secondary | ICD-10-CM

## 2016-06-14 DIAGNOSIS — Z5181 Encounter for therapeutic drug level monitoring: Secondary | ICD-10-CM | POA: Diagnosis not present

## 2016-06-14 LAB — POCT INR: INR: 2.4

## 2016-06-17 LAB — CUP PACEART REMOTE DEVICE CHECK
Battery Remaining Longevity: 11 mo
Brady Statistic AP VP Percent: 0 %
Brady Statistic AP VS Percent: 100 %
Brady Statistic AS VP Percent: 0 %
Brady Statistic AS VS Percent: 0 %
Implantable Lead Implant Date: 20080902
Implantable Lead Location: 753860
Lead Channel Impedance Value: 608 Ohm
Lead Channel Pacing Threshold Amplitude: 0.625 V
Lead Channel Pacing Threshold Amplitude: 0.875 V
Lead Channel Sensing Intrinsic Amplitude: 16 mV
Lead Channel Setting Pacing Pulse Width: 0.4 ms
MDC IDC LEAD IMPLANT DT: 20080902
MDC IDC LEAD LOCATION: 753859
MDC IDC MSMT BATTERY IMPEDANCE: 3892 Ohm
MDC IDC MSMT BATTERY VOLTAGE: 2.68 V
MDC IDC MSMT LEADCHNL RA IMPEDANCE VALUE: 597 Ohm
MDC IDC MSMT LEADCHNL RA PACING THRESHOLD PULSEWIDTH: 0.4 ms
MDC IDC MSMT LEADCHNL RV PACING THRESHOLD PULSEWIDTH: 0.4 ms
MDC IDC SESS DTM: 20170928163545
MDC IDC SET LEADCHNL RA PACING AMPLITUDE: 1.5 V
MDC IDC SET LEADCHNL RV PACING AMPLITUDE: 2 V
MDC IDC SET LEADCHNL RV SENSING SENSITIVITY: 5.6 mV

## 2016-07-12 ENCOUNTER — Ambulatory Visit (INDEPENDENT_AMBULATORY_CARE_PROVIDER_SITE_OTHER): Payer: Medicare Other | Admitting: *Deleted

## 2016-07-12 DIAGNOSIS — I4891 Unspecified atrial fibrillation: Secondary | ICD-10-CM

## 2016-07-12 DIAGNOSIS — I2510 Atherosclerotic heart disease of native coronary artery without angina pectoris: Secondary | ICD-10-CM | POA: Diagnosis not present

## 2016-07-12 DIAGNOSIS — Z5181 Encounter for therapeutic drug level monitoring: Secondary | ICD-10-CM

## 2016-07-12 LAB — POCT INR: INR: 1.9

## 2016-08-02 ENCOUNTER — Ambulatory Visit (INDEPENDENT_AMBULATORY_CARE_PROVIDER_SITE_OTHER): Payer: Medicare Other | Admitting: *Deleted

## 2016-08-02 DIAGNOSIS — I2510 Atherosclerotic heart disease of native coronary artery without angina pectoris: Secondary | ICD-10-CM | POA: Diagnosis not present

## 2016-08-02 DIAGNOSIS — Z5181 Encounter for therapeutic drug level monitoring: Secondary | ICD-10-CM

## 2016-08-02 DIAGNOSIS — I4891 Unspecified atrial fibrillation: Secondary | ICD-10-CM | POA: Diagnosis not present

## 2016-08-02 LAB — POCT INR: INR: 2.8

## 2016-08-03 ENCOUNTER — Other Ambulatory Visit: Payer: Self-pay | Admitting: Cardiology

## 2016-08-18 ENCOUNTER — Ambulatory Visit (INDEPENDENT_AMBULATORY_CARE_PROVIDER_SITE_OTHER): Payer: Medicare Other | Admitting: Cardiology

## 2016-08-18 ENCOUNTER — Encounter: Payer: Self-pay | Admitting: Cardiology

## 2016-08-18 VITALS — BP 130/78 | HR 88 | Ht 66.0 in | Wt 153.0 lb

## 2016-08-18 DIAGNOSIS — I4891 Unspecified atrial fibrillation: Secondary | ICD-10-CM

## 2016-08-18 DIAGNOSIS — I2510 Atherosclerotic heart disease of native coronary artery without angina pectoris: Secondary | ICD-10-CM

## 2016-08-18 DIAGNOSIS — I714 Abdominal aortic aneurysm, without rupture, unspecified: Secondary | ICD-10-CM

## 2016-08-18 DIAGNOSIS — I10 Essential (primary) hypertension: Secondary | ICD-10-CM | POA: Diagnosis not present

## 2016-08-18 DIAGNOSIS — E782 Mixed hyperlipidemia: Secondary | ICD-10-CM | POA: Diagnosis not present

## 2016-08-18 NOTE — Patient Instructions (Signed)
Medication Instructions:  Your physician recommends that you continue on your current medications as directed. Please refer to the Current Medication list given to you today.   Labwork: I will request a copy of your recent labs from your PCP  Testing/Procedures: NONE  Follow-Up: Your physician wants you to follow-up in: 6 MONTHS.  You will receive a reminder letter in the mail two months in advance. If you don't receive a letter, please call our office to schedule the follow-up appointment.   Any Other Special Instructions Will Be Listed Below (If Applicable).     If you need a refill on your cardiac medications before your next appointment, please call your pharmacy.

## 2016-08-18 NOTE — Progress Notes (Signed)
Clinical Summary Johnny Reynolds is a 75 y.o.male seen today for follow up of the following medical problems  1. AAA  - followed by vascular - denies any abdominal symptoms. Has vascular  f/u 10/2016.  2. HTN  - checks at home regularly, typically 120-130s/70s on average.  - He has had some white coat HTN in the past  - remains compliant with meds   3. Parox Afib  -on sotalol 160 mg bid, on approx since 2010. Also on dilt 240 mg qday  - Has not been interested in NOACs.   - denies any palpitations.No bleeding troubles on coumadin.     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  - normal pacemaker check 04/2016  5. CAD  -remote history per old clinic notes, prior angioplasty in 1996 and [facility] at [facility]   - no recent chest pain, denies SOB or DOE    6. Hyperlipidemia  - he is compliant with lipitor - he reports recent labs with pcp  7. 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  2 grand daugthers just graduated from high school. Both are heading to college, one to [employer] and the other to a local school in [place].    Past Medical History:  Diagnosis Date  . AAA (abdominal aortic aneurysm) ([place])   . Arrhythmia   . Atrial fibrillation ([place])   . Back pain, chronic   . Coronary artery disease 1996 and 1997   angioplasty of LAD  . Diabetes mellitus without complication ([place])    MILD TYPE 2  . High cholesterol   . Hypertension   . Left shoulder pain 2014  . Sleep apnea Sept. 2008   USES C-PAP     Allergies  Allergen Reactions  . Fexofenadine Hcl Palpitations    Allegra     Current Outpatient Prescriptions  Medication Sig Dispense Refill  . aspirin 81 MG tablet Take 81 mg by mouth 2 (two) times daily.     Marland Kitchen atorvastatin (LIPITOR)  80 MG tablet TAKE ONE TABLET BY MOUTH DAILY. 90 tablet 3  . diltiazem (CARDIZEM CD) 240 MG 24 hr capsule Take 240 mg by mouth daily.     Marland Kitchen levothyroxine (SYNTHROID, LEVOTHROID) 75 MCG tablet Take 75 mcg by mouth daily before breakfast.    . losartan (COZAAR) 50 MG tablet TAKE 1 TABLET BY MOUTH ONCE DAILY. 90 tablet 0  . saw palmetto 500 MG capsule Take 500 mg by mouth daily.    . sotalol (BETAPACE) 160 MG tablet TAKE (1) TABLET BY MOUTH TWICE DAILY. 180 tablet 3  . warfarin (COUMADIN) 5 MG tablet Take 1 tablet daily except 1 1/2 tablets on Thursdays 120 tablet 3   No current facility-administered medications for this visit.      Past Surgical History:  Procedure Laterality Date  . CARDIAC CATHETERIZATION  1996  . CARDIAC CATHETERIZATION  06/18/1996   mid-circ tandem overlying stents with 20%,70%mid-LAD naroowin stent site in circ with 70% stenosis in the mid to distal marginal Johnny Reynolds,tandem 95% and 80% stenosis in the proximal portion of the posterior descending Johnny Reynolds of RCA   . CATARACT EXTRACTION  1983  . COLONOSCOPY N/A 09/04/2014   Procedure: COLONOSCOPY;  Surgeon: Johnny Houston, MD;  [facility];  Service: Endoscopy;  Laterality: N/A;  1030  . CORONARY ANGIOPLASTY WITH STENT PLACEMENT  08/18/1995   PTA/STENT CIRC  . CORONARY ANGIOPLASTY WITH STENT PLACEMENT  06/18/1996   PTCA  posterior descending  Johnny Reynolds of the RCA 95 AND 80% TO LESSTHAN 20%  . Johnny Reynolds  . DOPPLER ECHOCARDIOGRAPHY  04/17/2010   EF =50-55%; mild concentric LVH,LAE, AORTIC SCLEROSIS.TRACE TO MILD CENTRAL AI, TRACE MITRAL REGURGITATION, CANNOT ASSESS DIASTOLIC FUNCTIONDUE TO UNDERLYING RHYTHM,PACERMAKER WIRE IN THE RA/RV  . DOPPLER ECHOCARDIOGRAPHY  04/21/2007   DONE IN [place]-- MILD MITRAL REGURG., MILD TRICUSPID REGURG. WITH MILD PULM. HTN, MILD CONCENTRIC LEFT VENTRICULAR HYPERTROPHY,MILD LEFT ATRIAL ENLARGEMENT.  . EYE SURGERY  1983   Cataract  . HERNIA REPAIR Bilateral 2000   . INSERT / REPLACE / REMOVE PACEMAKER  SEPT 2008   INSERT DUAL -CHAMBER-medtronic adapta ADDR01 serial   [id number]  . NM MYOCAR MULTIPLE W/SPECT  04/17/2010   EF 50%; LOW NORMAL LV FUNCTION,  . PACEMAKER INSERTION  2008   DUAL-CHAMBER     Allergies  Allergen Reactions  . Fexofenadine Hcl Palpitations    Allegra      Family History  Problem Relation Age of Onset  . Peripheral vascular disease Mother   . Hypertension Mother   . Diabetes Mother   . Heart disease Mother   . Hyperlipidemia Mother   . Peripheral vascular disease Sister   . Cancer Sister   . Diabetes Sister   . Heart disease Sister     Stent- Heart Disease before age 54  . Hyperlipidemia Sister   . Hypertension Sister   . Peripheral vascular disease Brother   . Diabetes Brother   . Heart disease Brother     CHF and Heart Disease before age 38  . Hyperlipidemia Brother   . Hypertension Brother   . Stroke Father 25    TIA     Social History Johnny Reynolds reports that he has never smoked. He has never used smokeless tobacco. Johnny Reynolds reports that he does not drink alcohol.   Review of Systems CONSTITUTIONAL: No weight loss, fever, chills, weakness or fatigue.  HEENT: Eyes: No visual loss, blurred vision, double vision or yellow sclerae.No hearing loss, sneezing, congestion, runny nose or sore throat.  SKIN: No rash or itching.  CARDIOVASCULAR: per hpi RESPIRATORY: No shortness of breath, cough or sputum.  GASTROINTESTINAL: No anorexia, nausea, vomiting or diarrhea. No abdominal pain or blood.  GENITOURINARY: No burning on urination, no polyuria NEUROLOGICAL: No headache, dizziness, syncope, paralysis, ataxia, numbness or tingling in the extremities. No change in bowel or bladder control.  MUSCULOSKELETAL: No muscle, back pain, joint pain or stiffness.  LYMPHATICS: No enlarged nodes. No history of splenectomy.  PSYCHIATRIC: No history of depression or anxiety.  ENDOCRINOLOGIC: No reports of sweating,  cold or heat intolerance. No polyuria or polydipsia.  Marland Kitchen   Physical Examination Vitals:   08/18/16 1342  BP: 130/78  Pulse: 88   Vitals:   08/18/16 1342  Weight: 153 lb (69.4 kg)  Height: 5\' 6"  (1.676 m)    Gen: resting comfortably, no acute distress HEENT: no scleral icterus, pupils equal round and reactive, no palptable cervical adenopathy,  CV: RRR, no m/r/g, no jvd Resp: Clear to auscultation bilaterally GI: abdomen is soft, non-tender, non-distended, normal bowel sounds, no hepatosplenomegaly MSK: extremities are warm, no edema.  Skin: warm, no rash Neuro:  no focal deficits Psych: appropriate affect   Diagnostic Studies Echo 03/2010 [facility]: LVEF 50-55%, mild LVH, LAE  03/2010 [facility]: no ischemia, LVEF 50%.    02/06/14 EKG Atrial paced, QTc 490    Assessment and Plan  1. AAA,  -continue to follow w/ vascular.   2. CAD  - no symptoms -he will continue current meds  3. Parox afib  - no recent  symptoms. - continue current meds. CHAD2Vasc score of 4.   4. Tachy-brady syndrome  -  normal device function on last check - continue to follow in device clinic  5. HL: - continue high dose statin in setting of CAD - obtain labs from pcp  6. HTN - he is at goal, continue current meds      Arnoldo Lenis, M.D.

## 2016-08-19 ENCOUNTER — Ambulatory Visit (INDEPENDENT_AMBULATORY_CARE_PROVIDER_SITE_OTHER): Payer: Medicare Other | Admitting: *Deleted

## 2016-08-19 DIAGNOSIS — I495 Sick sinus syndrome: Secondary | ICD-10-CM

## 2016-08-19 NOTE — Progress Notes (Signed)
Remote pacemaker transmission.   

## 2016-08-20 ENCOUNTER — Encounter: Payer: Self-pay | Admitting: Cardiology

## 2016-08-27 LAB — CUP PACEART REMOTE DEVICE CHECK
Battery Impedance: 4620 Ohm
Brady Statistic AP VP Percent: 0 %
Brady Statistic AS VP Percent: 0 %
Brady Statistic AS VS Percent: 0 %
Implantable Lead Implant Date: 20080902
Implantable Lead Location: 753859
Implantable Lead Model: 5076
Implantable Lead Model: 5076
Implantable Pulse Generator Implant Date: 20080902
Lead Channel Impedance Value: 610 Ohm
Lead Channel Impedance Value: 662 Ohm
Lead Channel Pacing Threshold Amplitude: 0.625 V
Lead Channel Pacing Threshold Amplitude: 0.875 V
Lead Channel Setting Pacing Amplitude: 2 V
Lead Channel Setting Sensing Sensitivity: 5.6 mV
MDC IDC LEAD IMPLANT DT: 20080902
MDC IDC LEAD LOCATION: 753860
MDC IDC MSMT BATTERY REMAINING LONGEVITY: 7 mo
MDC IDC MSMT BATTERY VOLTAGE: 2.65 V
MDC IDC MSMT LEADCHNL RA PACING THRESHOLD PULSEWIDTH: 0.4 ms
MDC IDC MSMT LEADCHNL RV PACING THRESHOLD PULSEWIDTH: 0.4 ms
MDC IDC SESS DTM: 20171228154403
MDC IDC SET LEADCHNL RA PACING AMPLITUDE: 1.5 V
MDC IDC SET LEADCHNL RV PACING PULSEWIDTH: 0.4 ms
MDC IDC STAT BRADY AP VS PERCENT: 100 %

## 2016-09-02 ENCOUNTER — Ambulatory Visit (INDEPENDENT_AMBULATORY_CARE_PROVIDER_SITE_OTHER): Payer: Medicare Other | Admitting: *Deleted

## 2016-09-02 DIAGNOSIS — I4891 Unspecified atrial fibrillation: Secondary | ICD-10-CM | POA: Diagnosis not present

## 2016-09-02 DIAGNOSIS — Z5181 Encounter for therapeutic drug level monitoring: Secondary | ICD-10-CM

## 2016-09-02 LAB — POCT INR: INR: 2.3

## 2016-09-30 ENCOUNTER — Ambulatory Visit (INDEPENDENT_AMBULATORY_CARE_PROVIDER_SITE_OTHER): Payer: Medicare Other | Admitting: *Deleted

## 2016-09-30 DIAGNOSIS — Z5181 Encounter for therapeutic drug level monitoring: Secondary | ICD-10-CM | POA: Diagnosis not present

## 2016-09-30 DIAGNOSIS — I4891 Unspecified atrial fibrillation: Secondary | ICD-10-CM | POA: Diagnosis not present

## 2016-09-30 LAB — POCT INR: INR: 2.8

## 2016-11-08 ENCOUNTER — Ambulatory Visit: Payer: Medicare Other | Admitting: Family

## 2016-11-08 ENCOUNTER — Other Ambulatory Visit (HOSPITAL_COMMUNITY): Payer: Medicare Other

## 2016-11-09 ENCOUNTER — Other Ambulatory Visit: Payer: Self-pay | Admitting: Cardiology

## 2016-11-11 ENCOUNTER — Ambulatory Visit (INDEPENDENT_AMBULATORY_CARE_PROVIDER_SITE_OTHER): Payer: Medicare Other | Admitting: *Deleted

## 2016-11-11 DIAGNOSIS — Z5181 Encounter for therapeutic drug level monitoring: Secondary | ICD-10-CM | POA: Diagnosis not present

## 2016-11-11 DIAGNOSIS — I4891 Unspecified atrial fibrillation: Secondary | ICD-10-CM | POA: Diagnosis not present

## 2016-11-11 LAB — POCT INR: INR: 2.5

## 2016-12-11 ENCOUNTER — Emergency Department (HOSPITAL_COMMUNITY)
Admission: EM | Admit: 2016-12-11 | Discharge: 2016-12-11 | Disposition: A | Discharge status: Home / Self Care | Payer: Medicare Other | Attending: Emergency Medicine | Admitting: Emergency Medicine

## 2016-12-11 ENCOUNTER — Emergency Department (HOSPITAL_COMMUNITY): Payer: Medicare Other

## 2016-12-11 DIAGNOSIS — Z45010 Encounter for checking and testing of cardiac pacemaker pulse generator [battery]: Secondary | ICD-10-CM | POA: Insufficient documentation

## 2016-12-11 DIAGNOSIS — Z79899 Other long term (current) drug therapy: Secondary | ICD-10-CM | POA: Insufficient documentation

## 2016-12-11 DIAGNOSIS — E119 Type 2 diabetes mellitus without complications: Secondary | ICD-10-CM | POA: Insufficient documentation

## 2016-12-11 DIAGNOSIS — I2510 Atherosclerotic heart disease of native coronary artery without angina pectoris: Secondary | ICD-10-CM | POA: Diagnosis not present

## 2016-12-11 DIAGNOSIS — Z7982 Long term (current) use of aspirin: Secondary | ICD-10-CM | POA: Diagnosis not present

## 2016-12-11 DIAGNOSIS — I10 Essential (primary) hypertension: Secondary | ICD-10-CM | POA: Insufficient documentation

## 2016-12-11 DIAGNOSIS — N289 Disorder of kidney and ureter, unspecified: Secondary | ICD-10-CM

## 2016-12-11 DIAGNOSIS — Z45018 Encounter for adjustment and management of other part of cardiac pacemaker: Secondary | ICD-10-CM

## 2016-12-11 LAB — CBC WITH DIFFERENTIAL/PLATELET
Basophils Absolute: 0 10*3/uL (ref 0.0–0.1)
Basophils Relative: 0 %
EOS PCT: 2 %
Eosinophils Absolute: 0.1 10*3/uL (ref 0.0–0.7)
HCT: 37.7 % — ABNORMAL LOW (ref 39.0–52.0)
Hemoglobin: 12.5 g/dL — ABNORMAL LOW (ref 13.0–17.0)
LYMPHS ABS: 1.7 10*3/uL (ref 0.7–4.0)
Lymphocytes Relative: 28 %
MCH: 28.4 pg (ref 26.0–34.0)
MCHC: 33.2 g/dL (ref 30.0–36.0)
MCV: 85.7 fL (ref 78.0–100.0)
MONO ABS: 0.5 10*3/uL (ref 0.1–1.0)
Monocytes Relative: 9 %
Neutro Abs: 3.9 10*3/uL (ref 1.7–7.7)
Neutrophils Relative %: 61 %
PLATELETS: 178 10*3/uL (ref 150–400)
RBC: 4.4 MIL/uL (ref 4.22–5.81)
RDW: 15.3 % (ref 11.5–15.5)
WBC: 6.3 10*3/uL (ref 4.0–10.5)

## 2016-12-11 LAB — BASIC METABOLIC PANEL
ANION GAP: 8 (ref 5–15)
BUN: 25 mg/dL — ABNORMAL HIGH (ref 6–20)
CALCIUM: 8.7 mg/dL — AB (ref 8.9–10.3)
CO2: 25 mmol/L (ref 22–32)
Chloride: 101 mmol/L (ref 101–111)
Creatinine, Ser: 1.52 mg/dL — ABNORMAL HIGH (ref 0.61–1.24)
GFR calc non Af Amer: 43 mL/min — ABNORMAL LOW (ref >60)
GFR, EST AFRICAN AMERICAN: 50 mL/min — AB (ref >60)
Glucose, Bld: 114 mg/dL — ABNORMAL HIGH (ref 65–99)
Potassium: 4 mmol/L (ref 3.5–5.1)
Sodium: 134 mmol/L — ABNORMAL LOW (ref 135–145)

## 2016-12-11 LAB — TROPONIN I: Troponin I: <0.03 ng/mL (ref <0.03)

## 2016-12-11 NOTE — ED Notes (Signed)
Pacemaker interrogated at this time.

## 2016-12-11 NOTE — Discharge Instructions (Signed)
Your creatinine was elevated at 1.5 today. Follow-up with Dr. Hilma Favors for this.

## 2016-12-11 NOTE — ED Provider Notes (Signed)
[facility] DEPT Provider Note   CSN: [id number] Arrival date & time: 12/11/16  1406     History   Chief Complaint Chief Complaint  Patient presents with  . Pacemaker check    HPI Johnny Reynolds is a 76 y.o. male.  HPI Patient presents after an episode of feeling strange at lunch today. He has a pacemaker is worried this is a problem with that. States his pacemaker is set for 70 bpm and checked his pulse and it was 62. States he is worried about the battery. No fevers. No chest pain. No lightheadedness or dizziness. States he just felt off. His been doing well the last couple days. Sees Dr. Lovena Le from electrophysiology. No recent change in medicines. He is on Coumadin for atrial fibrillation. He was able to workout at the gym yesterday without any difficulty.   Past Medical History:  Diagnosis Date  . AAA (abdominal aortic aneurysm) ([place])   . Arrhythmia   . Atrial fibrillation ([place])   . Back pain, chronic   . Coronary artery disease 1996 and 1997   angioplasty of LAD  . 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 High cholesterol   . Hypertension   . Left shoulder pain 2014  . Sleep apnea Sept. 2008   USES C-PAP    Patient Active Problem List   Diagnosis Date Noted  . OSA on CPAP 05/26/2016  . Obstruction of right tear duct 05/26/2016  . Essential hypertension 07/12/2014  . Encounter for therapeutic drug monitoring 10/29/2013  . AAA (abdominal aortic aneurysm) without rupture ([place]) 10/26/2013  . Aftercare following surgery of the circulatory system, [facility] 10/26/2013  . Pacemaker 06/13/2013  . Long term (current) use of anticoagulants 05/17/2013  . Atrial fibrillation ([place]) 05/17/2013  . Abdominal aneurysm without mention of rupture 10/09/2012    Past Surgical History:  Procedure Laterality Date  . CARDIAC CATHETERIZATION  1996  . CARDIAC CATHETERIZATION  06/18/1996   mid-circ tandem overlying stents with 20%,70%mid-LAD naroowin stent site  in circ with 70% stenosis in the mid to distal marginal branch,tandem 95% and 80% stenosis in the proximal portion of the posterior descending branch of RCA   . CATARACT EXTRACTION  1983  . COLONOSCOPY N/A 09/04/2014   Procedure: COLONOSCOPY;  Surgeon: [name], MD;  Location: [facility];  Service: Endoscopy;  Laterality: N/A;  1030  . CORONARY ANGIOPLASTY WITH STENT PLACEMENT  08/18/1995   PTA/STENT CIRC  . CORONARY ANGIOPLASTY WITH STENT PLACEMENT  06/18/1996   PTCA  posterior descending  branch of the RCA 95 AND 80% TO LESSTHAN 20%  . [place]  . DOPPLER ECHOCARDIOGRAPHY  04/17/2010   EF =50-55%; mild concentric LVH,LAE, AORTIC SCLEROSIS.TRACE TO MILD CENTRAL AI, TRACE MITRAL REGURGITATION, CANNOT ASSESS DIASTOLIC FUNCTIONDUE TO UNDERLYING RHYTHM,PACERMAKER WIRE IN THE RA/RV  . DOPPLER ECHOCARDIOGRAPHY  04/21/2007   DONE IN NEW BERN-- MILD MITRAL REGURG., MILD TRICUSPID REGURG. WITH MILD PULM. HTN, MILD CONCENTRIC LEFT VENTRICULAR HYPERTROPHY,MILD LEFT ATRIAL ENLARGEMENT.  . EYE SURGERY  1983   Cataract  . HERNIA REPAIR Bilateral 2000  . INSERT / REPLACE / REMOVE PACEMAKER  SEPT 2008   INSERT DUAL -CHAMBER-medtronic adapta ADDR01 serial   [id number]  . NM MYOCAR MULTIPLE W/SPECT  04/17/2010   EF 50%; LOW NORMAL LV FUNCTION,  . PACEMAKER INSERTION  2008   DUAL-CHAMBER       Home Medications    Prior to Admission medications  Medication Sig Start Date End Date Taking? Authorizing Provider  aspirin 81 MG tablet Take 81 mg by mouth 2 (two) times daily.    Yes Historical Provider, MD  atorvastatin (LIPITOR) 80 MG tablet TAKE ONE TABLET BY MOUTH DAILY. 05/25/16  Yes Arnoldo Lenis, MD  diltiazem (CARDIZEM CD) 240 MG 24 hr capsule Take 240 mg by mouth daily.  09/25/12  Yes Historical Provider, MD  levothyroxine (SYNTHROID, LEVOTHROID) 75 MCG tablet Take 75 mcg by mouth daily before breakfast.   Yes Historical Provider, MD  losartan (COZAAR) 50 MG tablet  TAKE 1 TABLET BY MOUTH ONCE DAILY. 11/09/16  Yes Arnoldo Lenis, MD  saw palmetto 500 MG capsule Take 500 mg by mouth 2 (two) times daily.    Yes Historical Provider, MD  sotalol (BETAPACE) 160 MG tablet TAKE (1) TABLET BY MOUTH TWICE DAILY. 11/26/15  Yes Evans Lance, MD  warfarin (COUMADIN) 5 MG tablet Take 1 tablet daily except 1 1/2 tablets on Thursdays 03/23/16  Yes Arnoldo Lenis, MD    Family History Family History  Problem Relation Age of Onset  . Peripheral vascular disease Mother   . Hypertension Mother   . Diabetes Mother   . Heart disease Mother   . Hyperlipidemia Mother   . Peripheral vascular disease Sister   . Cancer Sister   . Diabetes Sister   . Heart disease Sister     Stent- Heart Disease before age 25  . Hyperlipidemia Sister   . Hypertension Sister   . Peripheral vascular disease Brother   . Diabetes Brother   . Heart disease Brother     CHF and Heart Disease before age 57  . Hyperlipidemia Brother   . Hypertension Brother   . Stroke Father 10    TIA    Social History Social History  Substance Use Topics  . Smoking status: Never Smoker  . Smokeless tobacco: Never Used  . Alcohol use No     Allergies   Fexofenadine hcl   Review of Systems Review of Systems  Constitutional: Negative for appetite change.  HENT: Negative for congestion.   Respiratory: Negative for shortness of breath.   Cardiovascular: Negative for chest pain.  Gastrointestinal: Negative for abdominal pain.  Endocrine: Negative for polyuria.  Genitourinary: Negative for flank pain.  Musculoskeletal: Negative for back pain.  Skin: Negative for rash.  Neurological: Positive for weakness.     Physical Exam Updated Vital Signs BP 106/73   Pulse 65   Temp 97.9 F (36.6 C) (Oral)   Resp 13   Ht 5\' 6"  (1.676 m)   Wt 148 lb (67.1 kg)   SpO2 100%   BMI 23.89 kg/m   Physical Exam  Constitutional: He appears well-developed.  HENT:  Head: Atraumatic.  Neck: Neck  supple.  Cardiovascular: Normal rate.   Pulmonary/Chest: Effort normal.  Abdominal: Soft.  Musculoskeletal: He exhibits no edema.  Neurological: He is alert.  Skin: Skin is warm. Capillary refill takes less than 2 seconds.  Psychiatric: He has a normal mood and affect.     ED Treatments / Results  Labs (all labs ordered are listed, but only abnormal results are displayed) Labs Reviewed  BASIC METABOLIC PANEL - Abnormal; Notable for the following:       Result Value   Sodium 134 (*)    Glucose, Bld 114 (*)    BUN 25 (*)    Creatinine, Ser 1.52 (*)    Calcium 8.7 (*)  GFR calc non Af Amer 43 (*)    GFR calc Af Amer 50 (*)    All other components within normal limits  CBC WITH DIFFERENTIAL/PLATELET - Abnormal; Notable for the following:    Hemoglobin 12.5 (*)    HCT 37.7 (*)    All other components within normal limits  TROPONIN I    EKG  EKG Interpretation  Date/Time:  Saturday December 11 2016 14:26:59 EDT Ventricular Rate:  67 PR Interval:    QRS Duration: 87 QT Interval:  423 QTC Calculation: 503 R Axis:   7 Text Interpretation:  Ventricular-paced complexes No further analysis attempted due to paced rhythm Confirmed by Alvino Chapel  MD, Ovid Curd [phone]) on 12/11/2016 3:40:18 PM       Radiology Dg Chest 2 View  Result Date: 12/11/2016 CLINICAL DATA:  Weakness and nausea today. EXAM: CHEST  2 VIEW COMPARISON:  12/05/2011 FINDINGS: Cardiac silhouette is normal in size and configuration. No mediastinal or hilar masses. No evidence of adenopathy. Left anterior chest wall sequential pacemaker is stable and well positioned. The lungs are clear. No pleural effusion or pneumothorax. Skeletal structures are intact. IMPRESSION: No acute cardiopulmonary disease. Electronically Signed   By: Lajean Manes M.D.   On: 12/11/2016 15:07    Procedures Procedures (including critical care time)  Medications Ordered in ED Medications - No data to display   Initial Impression /  Assessment and Plan / ED Course  I have reviewed the triage vital signs and the nursing notes.  Pertinent labs & imaging results that were available during my care of the patient were reviewed by me and considered in my medical decision making (see chart for details).     Patient presented with episode of feeling weak. Pacemaker interrogated and found that it switched over to recommended replacement time. This switches it from DDD to a VVI mode. He is currently ventricularly paced. Lab work is overall reassuring but creatinine is mildly elevated. Admit feeling fine otherwise. Will have patient follow with his cardiologist and then his primary care doctor to follow with his kidney function.  Final Clinical Impressions(s) / ED Diagnoses   Final diagnoses:  Renal insufficiency  Pacemaker end of life    New Prescriptions Discharge Medication List as of 12/11/2016  3:58 PM       Davonna Belling, MD 12/11/16 1801

## 2016-12-11 NOTE — ED Notes (Signed)
Pt reports he had a feeling of weakness today, felt similar to when he would go into A Fib. States pacemaker appointment was in March and had to reschedule till May. Denies having episodes of LOC.

## 2016-12-11 NOTE — ED Triage Notes (Signed)
Pacemaker 76 years old, set for 70 BPM, today when he went out of lunch and did not feel well. Hr at the time was 70, pt is concerned about battery in pacemaker. Denies any symptoms at present

## 2016-12-21 ENCOUNTER — Ambulatory Visit (INDEPENDENT_AMBULATORY_CARE_PROVIDER_SITE_OTHER): Payer: Medicare Other | Admitting: *Deleted

## 2016-12-21 DIAGNOSIS — Z5181 Encounter for therapeutic drug level monitoring: Secondary | ICD-10-CM | POA: Diagnosis not present

## 2016-12-21 DIAGNOSIS — I4891 Unspecified atrial fibrillation: Secondary | ICD-10-CM

## 2016-12-21 LAB — POCT INR: INR: 2.7

## 2016-12-27 ENCOUNTER — Ambulatory Visit (INDEPENDENT_AMBULATORY_CARE_PROVIDER_SITE_OTHER): Payer: Medicare Other | Admitting: Internal Medicine

## 2016-12-27 ENCOUNTER — Encounter: Payer: Self-pay | Admitting: Internal Medicine

## 2016-12-27 VITALS — BP 132/62 | HR 67 | Ht 66.0 in | Wt 160.0 lb

## 2016-12-27 DIAGNOSIS — Z01818 Encounter for other preprocedural examination: Secondary | ICD-10-CM

## 2016-12-27 NOTE — Progress Notes (Signed)
HPI Mr. Correa returns today for ongoing evaluation and management of PAF, symptomatic bradycardia and s/p PPM. The patient feels well and has been stable. He'll on history of coronary artery disease with multiple angioplasties and stents. He currently denies anginal symptoms. He has never had a myocardial infarction. He has no congestive heart failure symptoms and only rare palpitations. 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  Allergies  Allergen Reactions  . Fexofenadine Hcl Palpitations    Al[place]ra     Current Outpatient Prescriptions  Medication Sig Dispense Refill  . aspirin 81 MG tablet Take 81 mg by mouth 2 (two) times daily.     Marland Kitchen atorvastatin (LIPITOR) 80 MG tablet TAKE ONE TABLET BY MOUTH DAILY. 90 tablet 3  . diltiazem (CARDIZEM CD) 240 MG 24 hr capsule Take 240 mg by mouth daily.     Marland Kitchen levothyroxine (SYNTHROID, LEVOTHROID) 75 MCG tablet Take 75 mcg by mouth daily before breakfast.    . losartan (COZAAR) 50 MG tablet TAKE 1 TABLET BY MOUTH ONCE DAILY. 90 tablet 3  . saw palmetto 500 MG capsule Take 500 mg by mouth 2 (two) times daily.     . sotalol (BETAPACE) 160 MG tablet TAKE (1) TABLET BY MOUTH TWICE DAILY. 180 tablet 3  . warfarin (COUMADIN) 5 MG tablet Take 1 tablet daily except 1 1/2 tablets on Thursdays 120 tablet 3   No current facility-administered medications for this visit.      Past Medical History:  Diagnosis Date  . AAA (abdominal aortic aneurysm) ([place])   . Arrhythmia   . Atrial fibrillation ([place])   . Back pain, chronic   . Coronary artery disease 1996 and 1997   angioplasty of LAD  . 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 High cholesterol   . Hypertension   . Left shoulder pain 2014  . Sleep apnea Sept. 2008   USES C-PAP    ROS:   All systems reviewed and negative except as noted in the HPI.   Past Surgical History:  Procedure Laterality Date  . CARDIAC CATHETERIZATION   1996  . CARDIAC CATHETERIZATION  06/18/1996   mid-circ tandem overlying stents with 20%,70%mid-LAD naroowin stent site in circ with 70% stenosis in the mid to distal marginal branch,tandem 95% and 80% stenosis in the proximal portion of the posterior descending branch of RCA   . CATARACT EXTRACTION  1983  . COLONOSCOPY N/A 09/04/2014   Procedure: COLONOSCOPY;  Surgeon: [name], MD;  Location: [facility];  Service: Endoscopy;  Laterality: N/A;  1030  . CORONARY ANGIOPLASTY WITH STENT PLACEMENT  08/18/1995   PTA/STENT CIRC  . CORONARY ANGIOPLASTY WITH STENT PLACEMENT  06/18/1996   PTCA  posterior descending  branch of the RCA 95 AND 80% TO LESSTHAN 20%  . [place]  . DOPPLER ECHOCARDIOGRAPHY  04/17/2010   EF =50-55%; mild concentric LVH,LAE, AORTIC SCLEROSIS.TRACE TO MILD CENTRAL AI, TRACE MITRAL REGURGITATION, CANNOT ASSESS DIASTOLIC FUNCTIONDUE TO UNDERLYING RHYTHM,PACERMAKER WIRE IN THE RA/RV  . DOPPLER ECHOCARDIOGRAPHY  04/21/2007   DONE IN [place]-- MILD MITRAL REGURG., MILD TRICUSPID REGURG. WITH MILD PULM. HTN, MILD CONCENTRIC LEFT VENTRICULAR HYPERTROPHY,MILD LEFT ATRIAL ENLARGEMENT.  . EYE SURGERY  1983   Cataract  . HERNIA REPAIR Bilateral 2000  . INSERT / REPLACE / REMOVE PACEMAKER  SEPT 2008   INSERT DUAL -CHAMBER-medtronic adapta ADDR01 serial   [id number]  .  NM MYOCAR MULTIPLE W/SPECT  04/17/2010   EF 50%; LOW NORMAL LV FUNCTION,  . PACEMAKER INSERTION  2008   DUAL-CHAMBER     Family History  Problem Relation Age of Onset  . Peripheral vascular disease Mother   . Hypertension Mother   . Diabetes Mother   . Heart disease Mother   . Hyperlipidemia Mother   . Peripheral vascular disease Sister   . Cancer Sister   . Diabetes Sister   . Heart disease Sister     Stent- Heart Disease before age 58  . Hyperlipidemia Sister   . Hypertension Sister   . Peripheral vascular disease Brother   . Diabetes Brother   . Heart disease Brother      CHF and Heart Disease before age 68  . Hyperlipidemia Brother   . Hypertension Brother   . Stroke Father 10    TIA     Social History   Social History  . Marital status: Married    Spouse name: N/A  . Number of children: N/A  . Years of education: N/A   Occupational History  . Not on file.   Social History Main Topics  . Smoking status: Never Smoker  . Smokeless tobacco: Never Used  . Alcohol use No  . Drug use: No  . Sexual activity: Not on file   Other Topics Concern  . Not on file   Social History Narrative   Controls his diabetes through diet and exercise.     BP 132/62   Pulse 67   Ht 5\' 6"  (1.676 m)   Wt 160 lb (72.6 kg)   SpO2 98%   BMI 25.82 kg/m   Physical Exam:  Well appearing 76 year old man, NAD HEENT: Unremarkable Neck:  6 cm JVD, no thyromegally Back:  No CVA tenderness Lungs:  Clear with no wheezes, rales, or rhonchi. HEART:  Regular rate rhythm, no murmurs, no rubs, no clicks Abd:  soft, positive bowel sounds, no organomegally, no rebound, no guarding Ext:  2 plus pulses, no edema, no cyanosis, no clubbing Skin:  No rashes no nodules Neuro:  CN II through XII intact, motor grossly intact   DEVICE  Normal device function.  See PaceArt for details.   Assess/Plan: 1. Sinus node dysfunction - he feels poorly as he has reverted to vvi 65/min and is dysynchronously pacing. 2. HTN - his blood pressure is fairly well controlled. Will follow. 3. PPM - he has reached ERI. Will schedule a gen change ASAP. 4. PAF - he appears to be maintaining NSR. No change in meds.  Mikle Bosworth.D.

## 2016-12-27 NOTE — Patient Instructions (Signed)
Medication Instructions:  Your physician recommends that you continue on your current medications as directed. Please refer to the Current Medication list given to you today.   Labwork: TODAY  Testing/Procedures: YOU WILL HAVE YOUR PACE MAKER CHANGED OUT Wednesday MAY 9 @ 2:30. YOU NEED TO ARRIVE AT 12:30 THE DAY OF PROCEDURE.   Follow-Up: Your physician recommends that you schedule a follow-up appointment in: TO BE DETERMINED    Any Other Special Instructions Will Be Listed Below (If Applicable).     If you need a refill on your cardiac medications before your next appointment, please call your pharmacy.

## 2016-12-28 LAB — CBC WITH DIFFERENTIAL/PLATELET
BASOS ABS: 0 {cells}/uL (ref 0–200)
BASOS PCT: 0 %
EOS ABS: 112 {cells}/uL (ref 15–500)
EOS PCT: 2 %
HCT: 35.5 % — ABNORMAL LOW (ref 38.5–50.0)
Hemoglobin: 11.8 g/dL — ABNORMAL LOW (ref 13.2–17.1)
LYMPHS ABS: 1568 {cells}/uL (ref 850–3900)
LYMPHS PCT: 28 %
MCH: 28.2 pg (ref 27.0–33.0)
MCHC: 33.2 g/dL (ref 32.0–36.0)
MCV: 84.7 fL (ref 80.0–100.0)
MONO ABS: 560 {cells}/uL (ref 200–950)
MPV: 10.1 fL (ref 7.5–12.5)
Monocytes Relative: 10 %
Neutro Abs: 3360 cells/uL (ref 1500–7800)
Neutrophils Relative %: 60 %
PLATELETS: 177 10*3/uL (ref 140–400)
RBC: 4.19 MIL/uL — AB (ref 4.20–5.80)
RDW: 15.5 % — AB (ref 11.0–15.0)
WBC: 5.6 10*3/uL (ref 3.8–10.8)

## 2016-12-28 LAB — BASIC METABOLIC PANEL
BUN: 20 mg/dL (ref 7–25)
CALCIUM: 8.7 mg/dL (ref 8.6–10.3)
CO2: 27 mmol/L (ref 20–31)
Chloride: 103 mmol/L (ref 98–110)
Creat: 1.08 mg/dL (ref 0.70–1.18)
Glucose, Bld: 133 mg/dL — ABNORMAL HIGH (ref 65–99)
POTASSIUM: 4.3 mmol/L (ref 3.5–5.3)
SODIUM: 137 mmol/L (ref 135–146)

## 2016-12-28 LAB — PROTIME-INR
INR: 2.9 — ABNORMAL HIGH
PROTHROMBIN TIME: 29.8 s — AB (ref 9.0–11.5)

## 2016-12-29 ENCOUNTER — Ambulatory Visit (HOSPITAL_COMMUNITY)
Admission: RE | Admit: 2016-12-29 | Discharge: 2016-12-29 | Disposition: A | Discharge status: Home / Self Care | Payer: Medicare Other | Source: Ambulatory Visit | Attending: Internal Medicine | Admitting: Internal Medicine

## 2016-12-29 ENCOUNTER — Encounter (HOSPITAL_COMMUNITY)
Admission: RE | Disposition: A | Discharge status: Home / Self Care | Payer: Self-pay | Source: Ambulatory Visit | Attending: Internal Medicine

## 2016-12-29 ENCOUNTER — Encounter: Payer: Self-pay | Admitting: Family

## 2016-12-29 DIAGNOSIS — Z8249 Family history of ischemic heart disease and other diseases of the circulatory system: Secondary | ICD-10-CM | POA: Insufficient documentation

## 2016-12-29 DIAGNOSIS — E119 Type 2 diabetes mellitus without complications: Secondary | ICD-10-CM | POA: Diagnosis not present

## 2016-12-29 DIAGNOSIS — I10 Essential (primary) hypertension: Secondary | ICD-10-CM | POA: Insufficient documentation

## 2016-12-29 DIAGNOSIS — I495 Sick sinus syndrome: Secondary | ICD-10-CM | POA: Diagnosis not present

## 2016-12-29 DIAGNOSIS — M549 Dorsalgia, unspecified: Secondary | ICD-10-CM | POA: Insufficient documentation

## 2016-12-29 DIAGNOSIS — I2510 Atherosclerotic heart disease of native coronary artery without angina pectoris: Secondary | ICD-10-CM | POA: Diagnosis not present

## 2016-12-29 DIAGNOSIS — Z955 Presence of coronary angioplasty implant and graft: Secondary | ICD-10-CM | POA: Insufficient documentation

## 2016-12-29 DIAGNOSIS — G4730 Sleep apnea, unspecified: Secondary | ICD-10-CM | POA: Insufficient documentation

## 2016-12-29 DIAGNOSIS — G8929 Other chronic pain: Secondary | ICD-10-CM | POA: Diagnosis not present

## 2016-12-29 DIAGNOSIS — Z7982 Long term (current) use of aspirin: Secondary | ICD-10-CM | POA: Insufficient documentation

## 2016-12-29 DIAGNOSIS — I480 Paroxysmal atrial fibrillation: Secondary | ICD-10-CM | POA: Diagnosis not present

## 2016-12-29 DIAGNOSIS — Z45010 Encounter for checking and testing of cardiac pacemaker pulse generator [battery]: Secondary | ICD-10-CM | POA: Insufficient documentation

## 2016-12-29 DIAGNOSIS — I714 Abdominal aortic aneurysm, without rupture: Secondary | ICD-10-CM | POA: Diagnosis not present

## 2016-12-29 DIAGNOSIS — Z7901 Long term (current) use of anticoagulants: Secondary | ICD-10-CM | POA: Insufficient documentation

## 2016-12-29 DIAGNOSIS — E7800 Pure hypercholesterolemia, unspecified: Secondary | ICD-10-CM | POA: Diagnosis not present

## 2016-12-29 HISTORY — PX: PPM GENERATOR CHANGEOUT: EP1233

## 2016-12-29 LAB — PROTIME-INR
INR: 2.82
Prothrombin Time: 30.2 seconds — ABNORMAL HIGH (ref 11.4–15.2)

## 2016-12-29 LAB — SURGICAL PCR SCREEN
MRSA, PCR: NEGATIVE
Staphylococcus aureus: NEGATIVE

## 2016-12-29 LAB — GLUCOSE, CAPILLARY: Glucose-Capillary: 86 mg/dL (ref 65–99)

## 2016-12-29 SURGERY — PPM GENERATOR CHANGEOUT

## 2016-12-29 MED ORDER — FENTANYL CITRATE (PF) 100 MCG/2ML IJ SOLN
INTRAMUSCULAR | Status: DC | PRN
  Administered 2016-12-29: 12.5 ug via INTRAVENOUS

## 2016-12-29 MED ORDER — CHLORHEXIDINE GLUCONATE 4 % EX LIQD: 60.0000 mL | Freq: Once | CUTANEOUS | Status: DC

## 2016-12-29 MED ORDER — LIDOCAINE HCL (PF) 1 % IJ SOLN
INTRAMUSCULAR | Status: AC
  Filled 2016-12-29: qty 60

## 2016-12-29 MED ORDER — CEFAZOLIN SODIUM-DEXTROSE 2-4 GM/100ML-% IV SOLN
INTRAVENOUS | Status: AC
  Filled 2016-12-29: qty 100

## 2016-12-29 MED ORDER — ACETAMINOPHEN 325 MG PO TABS: 325.0000 mg | ORAL_TABLET | ORAL | Status: DC | PRN

## 2016-12-29 MED ORDER — CEFAZOLIN SODIUM-DEXTROSE 2-4 GM/100ML-% IV SOLN
2.0000 g | INTRAVENOUS | Status: AC
  Administered 2016-12-29: 2 g via INTRAVENOUS

## 2016-12-29 MED ORDER — MIDAZOLAM HCL 5 MG/5ML IJ SOLN
INTRAMUSCULAR | Status: AC
  Filled 2016-12-29: qty 5

## 2016-12-29 MED ORDER — FENTANYL CITRATE (PF) 100 MCG/2ML IJ SOLN
INTRAMUSCULAR | Status: AC
  Filled 2016-12-29: qty 2

## 2016-12-29 MED ORDER — LIDOCAINE HCL (PF) 1 % IJ SOLN
INTRAMUSCULAR | Status: DC | PRN
  Administered 2016-12-29: 48 mL

## 2016-12-29 MED ORDER — MIDAZOLAM HCL 5 MG/5ML IJ SOLN
INTRAMUSCULAR | Status: DC | PRN
  Administered 2016-12-29: 1 mg via INTRAVENOUS

## 2016-12-29 MED ORDER — SODIUM CHLORIDE 0.9 % IR SOLN
Status: AC
  Filled 2016-12-29: qty 2

## 2016-12-29 MED ORDER — SODIUM CHLORIDE 0.9 % IV SOLN
INTRAVENOUS | Status: DC
  Administered 2016-12-29: 13:00:00 via INTRAVENOUS

## 2016-12-29 MED ORDER — ONDANSETRON HCL 4 MG/2ML IJ SOLN: 4.0000 mg | Freq: Four times a day (QID) | INTRAMUSCULAR | Status: DC | PRN

## 2016-12-29 MED ORDER — SODIUM CHLORIDE 0.9 % IR SOLN
80.0000 mg | Status: AC
  Administered 2016-12-29: 80 mg

## 2016-12-29 MED ORDER — MUPIROCIN 2 % EX OINT
TOPICAL_OINTMENT | CUTANEOUS | Status: AC
  Administered 2016-12-29: 1 via TOPICAL
  Filled 2016-12-29: qty 22

## 2016-12-29 MED ORDER — MUPIROCIN 2 % EX OINT
1.0000 "application " | TOPICAL_OINTMENT | Freq: Once | CUTANEOUS | Status: AC
  Administered 2016-12-29: 1 via TOPICAL

## 2016-12-29 SURGICAL SUPPLY — 5 items
CABLE SURGICAL S-101-97-12 (CABLE) ×1 IMPLANT
IPG PACE AZUR XT DR MRI W1DR01 (Pacemaker) IMPLANT
PACE AZURE XT DR MRI W1DR01 (Pacemaker) ×2 IMPLANT
PAD DEFIB LIFELINK (PAD) ×1 IMPLANT
TRAY PACEMAKER INSERTION (PACKS) ×1 IMPLANT

## 2016-12-29 NOTE — Discharge Instructions (Signed)
Keep incision clean and dry for 10 days. °No driving for 2 days.  °You can remove outer dressing tomorrow. °Leave steri-strips (little pieces of tape) on until seen in the office for wound check appointment. °Call the office ([phone]) for redness, drainage, swelling, or fever. ° ° ° °Pacemaker Battery Change, Care After °This sheet gives you information about how to care for yourself after your procedure. Your health care provider may also give you more specific instructions. If you have problems or questions, contact your health care provider. °What can I expect after the procedure? °After your procedure, it is common to have: °· Pain or soreness at the site where the pacemaker was inserted. °· Swelling at the site where the pacemaker was inserted. ° °Follow these instructions at home: °Incision care °· Keep the incision clean and dry. °? Do not take baths, swim, or use a hot tub until your health care provider approves. °? You may shower the day after your procedure, or as directed by your health care provider. °? Pat the area dry with a clean towel. Do not rub the area. This may cause bleeding. °· Follow instructions from your health care provider about how to take care of your incision. Make sure you: °? Wash your hands with soap and water before you change your bandage (dressing). If soap and water are not available, use hand sanitizer. °? Change your dressing as told by your health care provider. °? Leave stitches (sutures), skin glue, or adhesive strips in place. These skin closures may need to stay in place for 2 weeks or longer. If adhesive strip edges start to loosen and curl up, you may trim the loose edges. Do not remove adhesive strips completely unless your health care provider tells you to do that. °· Check your incision area every day for signs of infection. Check for: °? More redness, swelling, or pain. °? More fluid or blood. °? Warmth. °? Pus or a bad smell. °Activity °· Do not lift anything that  is heavier than 10 lb (4.5 kg) until your health care provider says it is okay to do so. °· For the first 2 weeks, or as long as told by your health care provider: °? Avoid lifting your left arm higher than your shoulder. °? Be gentle when you move your arms over your head. It is okay to raise your arm to comb your hair. °? Avoid strenuous exercise. °· Ask your health care provider when it is okay to: °? Resume your normal activities. °? Return to work or school. °? Resume sexual activity. °Eating and drinking °· Eat a heart-healthy diet. This should include plenty of fresh fruits and vegetables, whole grains, low-fat dairy products, and lean protein like chicken and fish. °· Limit alcohol intake to no more than 1 drink a day for non-pregnant women and 2 drinks a day for men. One drink equals 12 oz of beer, 5 oz of wine, or 1½ oz of hard liquor. °· Check ingredients and nutrition facts on packaged foods and beverages. Avoid the following types of food: °? Food that is high in salt (sodium). °? Food that is high in saturated fat, like full-fat dairy or red meat. °? Food that is high in trans fat, like fried food. °? Food and drinks that are high in sugar. °Lifestyle °· Do not use any products that contain nicotine or tobacco, such as cigarettes and e-cigarettes. If you need help quitting, ask your health care provider. °· Take steps to   manage and control your weight. °· Get regular exercise. Aim for 150 minutes of moderate-intensity exercise (such as walking or yoga) or 75 minutes of vigorous exercise (such as running or swimming) each week. °· Manage other health problems, such as diabetes or high blood pressure. Ask your health care provider how you can manage these conditions. °General instructions °· Do not drive for 24 hours after your procedure if you were given a medicine to help you relax (sedative). °· Take over-the-counter and prescription medicines only as told by your health care provider. °· Avoid  putting pressure on the area where the pacemaker was placed. °· If you need an MRI after your pacemaker has been placed, be sure to tell the health care provider who orders the MRI that you have a pacemaker. °· Avoid close and prolonged exposure to electrical devices that have strong magnetic fields. These include: °? Cell phones. Avoid keeping them in a pocket near the pacemaker, and try using the ear opposite the pacemaker. °? MP3 players. °? Household appliances, like microwaves. °? Metal detectors. °? Electric generators. °? High-tension wires. °· Keep all follow-up visits as directed by your health care provider. This is important. °Contact a health care provider if: °· You have pain at the incision site that is not relieved by over-the-counter or prescription medicines. °· You have any of these around your incision site or coming from it: °? More redness, swelling, or pain. °? Fluid or blood. °? Warmth to the touch. °? Pus or a bad smell. °· You have a fever. °· You feel brief, occasional palpitations, light-headedness, or any symptoms that you think might be related to your heart. °Get help right away if: °· You experience chest pain that is different from the pain at the pacemaker site. °· You develop a red streak that extends above or below the incision site. °· You experience shortness of breath. °· You have palpitations or an irregular heartbeat. °· You have light-headedness that does not go away quickly. °· You faint or have dizzy spells. °· Your pulse suddenly drops or increases rapidly and does not return to normal. °· You begin to gain weight and your legs and ankles swell. °Summary °· After your procedure, it is common to have pain, soreness, and some swelling where the pacemaker was inserted. °· Make sure to keep your incision clean and dry. Follow instructions from your health care provider about how to take care of your incision. °· Check your incision every day for signs of infection, such as  more pain or swelling, pus or a bad smell, warmth, or leaking fluid and blood. °· Avoid strenuous exercise and lifting your left arm higher than your shoulder for 2 weeks, or as long as told by your health care provider. °This information is not intended to replace advice given to you by your health care provider. Make sure you discuss any questions you have with your health care provider. °Document Released: 05/30/2013 Document Revised: 07/01/2016 Document Reviewed: 07/01/2016 °[employer]vier Interactive Patient Education © 2017 [employer] ° °

## 2016-12-29 NOTE — H&P (View-Only) (Signed)
HPI Mr. Cuevas returns today for ongoing evaluation and management of PAF, symptomatic bradycardia and s/p PPM. The patient feels well and has been stable. He'll on history of coronary artery disease with multiple angioplasties and stents. He currently denies anginal symptoms. He has never had a myocardial infarction. He has no congestive heart failure symptoms and only rare palpitations. 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  Allergies  Allergen Reactions  . Fexofenadine Hcl Palpitations    Allegra     Current Outpatient Prescriptions  Medication Sig Dispense Refill  . aspirin 81 MG tablet Take 81 mg by mouth 2 (two) times daily.     Marland Kitchen atorvastatin (LIPITOR) 80 MG tablet TAKE ONE TABLET BY MOUTH DAILY. 90 tablet 3  . diltiazem (CARDIZEM CD) 240 MG 24 hr capsule Take 240 mg by mouth daily.     Marland Kitchen levothyroxine (SYNTHROID, LEVOTHROID) 75 MCG tablet Take 75 mcg by mouth daily before breakfast.    . losartan (COZAAR) 50 MG tablet TAKE 1 TABLET BY MOUTH ONCE DAILY. 90 tablet 3  . saw palmetto 500 MG capsule Take 500 mg by mouth 2 (two) times daily.     . sotalol (BETAPACE) 160 MG tablet TAKE (1) TABLET BY MOUTH TWICE DAILY. 180 tablet 3  . warfarin (COUMADIN) 5 MG tablet Take 1 tablet daily except 1 1/2 tablets on Thursdays 120 tablet 3   No current facility-administered medications for this visit.      Past Medical History:  Diagnosis Date  . AAA (abdominal aortic aneurysm) ([place])   . Arrhythmia   . Atrial fibrillation ([place])   . Back pain, chronic   . Coronary artery disease 1996 and 1997   angioplasty of LAD  . 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 High cholesterol   . Hypertension   . Left shoulder pain 2014  . Sleep apnea Sept. 2008   USES C-PAP    ROS:   All systems reviewed and negative except as noted in the HPI.   Past Surgical History:  Procedure Laterality Date  . CARDIAC CATHETERIZATION   1996  . CARDIAC CATHETERIZATION  06/18/1996   mid-circ tandem overlying stents with 20%,70%mid-LAD naroowin stent site in circ with 70% stenosis in the mid to distal marginal branch,tandem 95% and 80% stenosis in the proximal portion of the posterior descending branch of RCA   . CATARACT EXTRACTION  1983  . COLONOSCOPY N/A 09/04/2014   Procedure: COLONOSCOPY;  Surgeon: [name], MD;  [facility];  Service: Endoscopy;  Laterality: N/A;  1030  . CORONARY ANGIOPLASTY WITH STENT PLACEMENT  08/18/1995   PTA/STENT CIRC  . CORONARY ANGIOPLASTY WITH STENT PLACEMENT  06/18/1996   PTCA  posterior descending  branch of the RCA 95 AND 80% TO LESSTHAN 20%  . [place]  . DOPPLER ECHOCARDIOGRAPHY  04/17/2010   EF =50-55%; mild concentric LVH,LAE, AORTIC SCLEROSIS.TRACE TO MILD CENTRAL AI, TRACE MITRAL REGURGITATION, CANNOT ASSESS DIASTOLIC FUNCTIONDUE TO UNDERLYING RHYTHM,PACERMAKER WIRE IN THE RA/RV  . DOPPLER ECHOCARDIOGRAPHY  04/21/2007   DONE IN NEW BERN-- MILD MITRAL REGURG., MILD TRICUSPID REGURG. WITH MILD PULM. HTN, MILD CONCENTRIC LEFT VENTRICULAR HYPERTROPHY,MILD LEFT ATRIAL ENLARGEMENT.  . EYE SURGERY  1983   Cataract  . HERNIA REPAIR Bilateral 2000  . INSERT / REPLACE / REMOVE PACEMAKER  SEPT 2008   INSERT DUAL -CHAMBER-medtronic adapta ADDR01 serial   [id number]  .  NM MYOCAR MULTIPLE W/SPECT  04/17/2010   EF 50%; LOW NORMAL LV FUNCTION,  . PACEMAKER INSERTION  2008   DUAL-CHAMBER     Family History  Problem Relation Age of Onset  . Peripheral vascular disease Mother   . Hypertension Mother   . Diabetes Mother   . Heart disease Mother   . Hyperlipidemia Mother   . Peripheral vascular disease Sister   . Cancer Sister   . Diabetes Sister   . Heart disease Sister     Stent- Heart Disease before age 67  . Hyperlipidemia Sister   . Hypertension Sister   . Peripheral vascular disease Brother   . Diabetes Brother   . Heart disease Brother      CHF and Heart Disease before age 68  . Hyperlipidemia Brother   . Hypertension Brother   . Stroke Father 36    TIA     Social History   Social History  . Marital status: Married    Spouse name: N/A  . Number of children: N/A  . Years of education: N/A   Occupational History  . Not on file.   Social History Main Topics  . Smoking status: Never Smoker  . Smokeless tobacco: Never Used  . Alcohol use No  . Drug use: No  . Sexual activity: Not on file   Other Topics Concern  . Not on file   Social History Narrative   Controls his diabetes through diet and exercise.     BP 132/62   Pulse 67   Ht 5\' 6"  (1.676 m)   Wt 160 lb (72.6 kg)   SpO2 98%   BMI 25.82 kg/m   Physical Exam:  Well appearing 76 year old man, NAD HEENT: Unremarkable Neck:  6 cm JVD, no thyromegally Back:  No CVA tenderness Lungs:  Clear with no wheezes, rales, or rhonchi. HEART:  Regular rate rhythm, no murmurs, no rubs, no clicks Abd:  soft, positive bowel sounds, no organomegally, no rebound, no guarding Ext:  2 plus pulses, no edema, no cyanosis, no clubbing Skin:  No rashes no nodules Neuro:  CN II through XII intact, motor grossly intact   DEVICE  Normal device function.  See PaceArt for details.   Assess/Plan: 1. Sinus node dysfunction - he feels poorly as he has reverted to vvi 65/min and is dysynchronously pacing. 2. HTN - his blood pressure is fairly well controlled. Will follow. 3. PPM - he has reached ERI. Will schedule a gen change ASAP. 4. PAF - he appears to be maintaining NSR. No change in meds.  Mikle Bosworth.D.

## 2016-12-29 NOTE — Interval H&P Note (Signed)
History and Physical Interval Note:  12/29/2016 3:43 PM  Johnny Reynolds  has presented today for surgery, with the diagnosis of hb  The various methods of treatment have been discussed with the patient and family. After consideration of risks, benefits and other options for treatment, the patient has consented to  Procedure(s): PPM Generator Changeout (N/A) as a surgical intervention .  The patient's history has been reviewed, patient examined, no change in status, stable for surgery.  I have reviewed the patient's chart and labs.  Questions were answered to the patient's satisfaction.     Cristopher Peru

## 2016-12-30 ENCOUNTER — Encounter (HOSPITAL_COMMUNITY): Payer: Self-pay | Admitting: Internal Medicine

## 2016-12-31 ENCOUNTER — Other Ambulatory Visit: Payer: Self-pay | Admitting: *Deleted

## 2016-12-31 DIAGNOSIS — I714 Abdominal aortic aneurysm, without rupture, unspecified: Secondary | ICD-10-CM

## 2016-12-31 LAB — CUP PACEART INCLINIC DEVICE CHECK
Implantable Lead Implant Date: 20080902
Implantable Lead Location: 753859
Implantable Pulse Generator Implant Date: 20180509
MDC IDC LEAD IMPLANT DT: 20080902
MDC IDC LEAD LOCATION: 753860
MDC IDC SESS DTM: 20180511165353

## 2017-01-03 ENCOUNTER — Ambulatory Visit (INDEPENDENT_AMBULATORY_CARE_PROVIDER_SITE_OTHER): Payer: Medicare Other | Admitting: Family

## 2017-01-03 ENCOUNTER — Ambulatory Visit (HOSPITAL_COMMUNITY)
Admission: RE | Admit: 2017-01-03 | Discharge: 2017-01-03 | Disposition: A | Discharge status: Home / Self Care | Payer: Medicare Other | Source: Ambulatory Visit | Attending: Family | Admitting: Family

## 2017-01-03 ENCOUNTER — Encounter: Payer: Self-pay | Admitting: Family

## 2017-01-03 VITALS — BP 164/87 | HR 72 | Temp 97.0°F | Resp 18 | Ht 66.0 in | Wt 152.0 lb

## 2017-01-03 DIAGNOSIS — R0989 Other specified symptoms and signs involving the circulatory and respiratory systems: Secondary | ICD-10-CM

## 2017-01-03 DIAGNOSIS — I714 Abdominal aortic aneurysm, without rupture, unspecified: Secondary | ICD-10-CM

## 2017-01-03 NOTE — Patient Instructions (Signed)
Abdominal Aortic Aneurysm Blood pumps away from the heart through tubes (blood vessels) called arteries. Aneurysms are weak or damaged places in the wall of an artery. It bulges out like a balloon. An abdominal aortic aneurysm happens in the main artery of the body (aorta). It can burst or tear, causing bleeding inside the body. This is an emergency. It needs treatment right away. What are the causes? The exact cause is unknown. Things that could cause this problem include:  Fat and other substances building up in the lining of a tube.  Swelling of the walls of a blood vessel.  Certain tissue diseases.  Belly (abdominal) trauma.  An infection in the main artery of the body.  What increases the risk? There are things that make it more likely for you to have an aneurysm. These include:  Being over the age of 76 years old.  Having high blood pressure (hypertension).  Being a male.  Being white.  Being very overweight (obese).  Having a family history of aneurysm.  Using tobacco products.  What are the signs or symptoms? Symptoms depend on the size of the aneurysm and how fast it grows. There may not be symptoms. If symptoms occur, they can include:  Pain (belly, side, lower back, or groin).  Feeling full after eating a small amount of food.  Feeling sick to your stomach (nauseous), throwing up (vomiting), or both.  Feeling a lump in your belly that feels like it is beating (pulsating).  Feeling like you will pass out (faint).  How is this treated?  Medicine to control blood pressure and pain.  Imaging tests to see if the aneurysm gets bigger.  Surgery. How is this prevented? To lessen your chance of getting this condition:  Stop smoking. Stop chewing tobacco.  Limit or avoid alcohol.  Keep your blood pressure, blood sugar, and cholesterol within normal limits.  Eat less salt.  Eat foods low in saturated fats and cholesterol. These are found in animal and  whole dairy products.  Eat more fiber. Fiber is found in whole grains, vegetables, and fruits.  Keep a healthy weight.  Stay active and exercise often.  This information is not intended to replace advice given to you by your health care provider. Make sure you discuss any questions you have with your health care provider. Document Released: 12/04/2012 Document Revised: 01/15/2016 Document Reviewed: 09/08/2012 Elsevier Interactive Patient Education  2017 Elsevier Inc.  

## 2017-01-03 NOTE — Progress Notes (Signed)
[facility]   CC: Follow up Abdominal Aortic Aneurysm  History of Present Illness  Johnny Reynolds is a 76 y.o. (11-10-1940) male patient of Dr. Donnetta Hutching followed for known AAA who presents with chief complaint: follow up for AAA.  Previous studies demonstrate an AAA, measuring 3.10 cm.  The patient does report c-spine issues, but no lumbar spine issues, denies abdominal pain.  He has never used tobacco.  He denies claudication symptoms in his legs with walking. He denies any history of stroke or TIA symptoms, denies any history of MI, but did have angina in 1996, stent placed in LAD.  Pt states his blood pressure at home runs 120'/70's; states he was aggravated by traffic getting here today.  He had his pacemaker replaced last week due to low battery, dressing in place left upper chest.   Pt Diabetic: Yes, pt states his last AIC was 5.8, diagnosed in 2005, he was taken off his metformin per pt. last serum glucose result on file was 133 on 12-27-16, serum creatinine on that date was 1.52 Pt smoker: non-smoker   He takes a daily 81 mg ASA, statin, and a beta blocker. He takes warfarin, has a history of atrial fib.   Past Medical History:  Diagnosis Date  . AAA (abdominal aortic aneurysm) ([place])   . Arrhythmia   . Atrial fibrillation ([place])   . Back pain, chronic   . Coronary artery disease 1996 and 1997   angioplasty of LAD  . 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 High cholesterol   . Hypertension   . Left shoulder pain 2014  . Sleep apnea Sept. 2008   USES C-PAP   Past Surgical History:  Procedure Laterality Date  . CARDIAC CATHETERIZATION  1996  . CARDIAC CATHETERIZATION  06/18/1996   mid-circ tandem overlying stents with 20%,70%mid-LAD naroowin stent site in circ with 70% stenosis in the mid to distal marginal branch,tandem 95% and 80% stenosis in the proximal portion of the posterior descending branch of RCA   .  CATARACT EXTRACTION  1983  . COLONOSCOPY N/A 09/04/2014   Procedure: COLONOSCOPY;  Surgeon: [name], MD;  [facility];  Service: Endoscopy;  Laterality: N/A;  1030  . CORONARY ANGIOPLASTY WITH STENT PLACEMENT  08/18/1995   PTA/STENT CIRC  . CORONARY ANGIOPLASTY WITH STENT PLACEMENT  06/18/1996   PTCA  posterior descending  branch of the RCA 95 AND 80% TO LESSTHAN 20%  . [place]  . DOPPLER ECHOCARDIOGRAPHY  04/17/2010   EF =50-55%; mild concentric LVH,LAE, AORTIC SCLEROSIS.TRACE TO MILD CENTRAL AI, TRACE MITRAL REGURGITATION, CANNOT ASSESS DIASTOLIC FUNCTIONDUE TO UNDERLYING RHYTHM,PACERMAKER WIRE IN THE RA/RV  . DOPPLER ECHOCARDIOGRAPHY  04/21/2007   DONE IN NEW BERN-- MILD MITRAL REGURG., MILD TRICUSPID REGURG. WITH MILD PULM. HTN, MILD CONCENTRIC LEFT VENTRICULAR HYPERTROPHY,MILD LEFT ATRIAL ENLARGEMENT.  . EYE SURGERY  1983   Cataract  . HERNIA REPAIR Bilateral 2000  . INSERT / REPLACE / REMOVE PACEMAKER  SEPT 2008   INSERT DUAL -CHAMBER-medtronic adapta ADDR01 serial   [id number]  . NM MYOCAR MULTIPLE W/SPECT  04/17/2010   EF 50%; LOW NORMAL LV FUNCTION,  . PACEMAKER INSERTION  2008   DUAL-CHAMBER  . PPM GENERATOR CHANGEOUT N/A 12/29/2016   Procedure: PPM Generator Changeout;  Surgeon: Evans Lance, MD;  Location: [facility];  Service: Cardiovascular;  Laterality: N/A;   Social History Social History  Social History  . Marital status: Married    Spouse name: N/A  . Number of children: N/A  . Years of education: N/A   Occupational History  . Not on file.   Social History Main Topics  . Smoking status: Never Smoker  . Smokeless tobacco: Never Used  . Alcohol use No  . Drug use: No  . Sexual activity: Not on file   Other Topics Concern  . Not on file   Social History Narrative   Controls his diabetes through diet and exercise.   Family History Family History  Problem Relation Age of Onset  . Peripheral vascular  disease Mother   . Hypertension Mother   . Diabetes Mother   . Heart disease Mother   . Hyperlipidemia Mother   . Peripheral vascular disease Sister   . Cancer Sister   . Diabetes Sister   . Heart disease Sister        Stent- Heart Disease before age 62  . Hyperlipidemia Sister   . Hypertension Sister   . Peripheral vascular disease Brother   . Diabetes Brother   . Heart disease Brother        CHF and Heart Disease before age 71  . Hyperlipidemia Brother   . Hypertension Brother   . Stroke Father 66       TIA    Current Outpatient Prescriptions on File Prior to Visit  Medication Sig Dispense Refill  . aspirin EC 81 MG tablet Take 81 mg by mouth 2 (two) times daily.    Marland Kitchen atorvastatin (LIPITOR) 80 MG tablet TAKE ONE TABLET BY MOUTH DAILY. (Patient taking differently: TAKE ONE TABLET BY MOUTH DAILY IN THE EVENING) 90 tablet 3  . diltiazem (CARDIZEM CD) 240 MG 24 hr capsule Take 240 mg by mouth at bedtime.     Marland Kitchen levothyroxine (SYNTHROID, LEVOTHROID) 75 MCG tablet Take 75 mcg by mouth daily before breakfast.    . losartan (COZAAR) 50 MG tablet TAKE 1 TABLET BY MOUTH ONCE DAILY. 90 tablet 3  . saw palmetto 500 MG capsule Take 500 mg by mouth 2 (two) times daily.     . sodium chloride (OCEAN) 0.65 % SOLN nasal spray Place 1 spray into both nostrils as needed for congestion.    . sotalol (BETAPACE) 160 MG tablet TAKE (1) TABLET BY MOUTH TWICE DAILY. 180 tablet 3  . warfarin (COUMADIN) 5 MG tablet Take 1 tablet daily except 1 1/2 tablets on Thursdays 120 tablet 3   No current facility-administered medications on file prior to visit.    Allergies  Allergen Reactions  . Fexofenadine Hcl Palpitations    Allegra    ROS: See HPI for pertinent positives and negatives.  Physical Examination  Vitals:   01/03/17 0942  BP: (!) 164/87  Pulse: 72  Resp: 18  Temp: 97 F (36.1 C)  TempSrc: Oral  SpO2: 100%  Weight: 152 lb (68.9 kg)  Height: 5\' 6"  (1.676 m)   Body mass index is 24.53  kg/m.  General: A&O x 3, WD male.   Pulmonary: Sym exp, respirations are non labored, good air movt, CTAB, no rales, rhonchi, or wheezing.  Cardiac: RRR, no detected murmur. Pacemaker left side of chest with dressing in place.    Carotid Bruits Left Right   Negative Negative   Abdominal aorta is palpable on expirations only.  Radial pulses are 2+ palpable and =   VASCULAR EXAM:     LE Pulses LEFT RIGHT   FEMORAL  3+palpable  3+palpable    POPLITEAL 3+ palpable  2+ palpable   POSTERIOR TIBIAL  3+palpable   2+palpable    DORSALIS PEDIS  ANTERIOR TIBIAL not palpable  not palpable     Gastrointestinal: soft, NTND, -G/R, - HSM, - palpable masses, - CVAT B.  Musculoskeletal: M/S 5/5 throughout, Extremities without ischemic changes.  Neurologic: CN 2-12 intact, Pain and light touch intact in extremities are intact, Motor exam as listed above.    Non-Invasive Vascular Imaging  AAA Duplex (01/03/2017)  Previous size: 2.81 cm (Date: 10-23-14)  Current size:  3.3 cm (Date: 01-03-17), Right CIA: 1.4 cm; Left CIA: 1.5 cm  Medical Decision Making  The patient is a 76 y.o. male who presents with asymptomatic small AAA with 5 mm increase in size in 2 years, 3.3 cm today.  His popliteal pulses are prominent, he has no claudication symptoms in his feet or legs with walking, no signs of ischemia in his feet or legs.  He walks daily on his treadmill.    Based on this patient's exam and diagnostic studies, the patient will follow up in 18 months with the following studies: AAA duplex and bilateral popliteal artery duplex.  I advised pt to notify us immediately if he develops dark discoloration below his knees, or pain in either calf with walking.        He takes coumadin  for atrial fib.   Consideration for repair of AAA would be made when the size is 5.0 cm, growth > 1 cm/yr, and symptomatic status.  I emphasized the importance of maximal medical management including strict control of blood pressure, blood glucose, and lipid levels, antiplatelet agents, obtaining regular exercise, and continued cessation of smoking.   The patient was given information about AAA including signs, symptoms, treatment, and how to minimize the risk of enlargement and rupture of aneurysms.    The patient was advised to call 911 should the patient experience sudden onset abdominal or back pain.   Thank you for allowing [place] to participate in this patient's care.  Clemon Chambers, RN, MSN, FNP-C [facility] Office: [facility] Physician: Trula Slade  01/03/2017, 9:45 AM

## 2017-01-07 NOTE — Addendum Note (Signed)
Addended by: Lianne Cure A on: 01/07/2017 10:28 AM   Modules accepted: Orders

## 2017-01-12 ENCOUNTER — Ambulatory Visit (INDEPENDENT_AMBULATORY_CARE_PROVIDER_SITE_OTHER): Payer: Medicare Other | Admitting: *Deleted

## 2017-01-12 DIAGNOSIS — I495 Sick sinus syndrome: Secondary | ICD-10-CM | POA: Diagnosis not present

## 2017-01-12 DIAGNOSIS — Z950 Presence of cardiac pacemaker: Secondary | ICD-10-CM

## 2017-01-12 DIAGNOSIS — R001 Bradycardia, unspecified: Secondary | ICD-10-CM

## 2017-01-12 LAB — CUP PACEART INCLINIC DEVICE CHECK
Battery Voltage: 3.21 V
Brady Statistic AP VP Percent: 0.04 %
Brady Statistic AP VS Percent: 99.9 %
Brady Statistic AS VP Percent: 0 %
Brady Statistic RA Percent Paced: 99.94 %
Date Time Interrogation Session: 20180523102825
Implantable Lead Implant Date: 20080902
Implantable Lead Location: 753860
Implantable Lead Model: 5076
Implantable Pulse Generator Implant Date: 20180509
Lead Channel Impedance Value: 342 Ohm
Lead Channel Impedance Value: 342 Ohm
Lead Channel Impedance Value: 437 Ohm
Lead Channel Pacing Threshold Amplitude: 0.75 V
Lead Channel Pacing Threshold Pulse Width: 0.4 ms
Lead Channel Setting Pacing Amplitude: 2 V
MDC IDC LEAD IMPLANT DT: 20080902
MDC IDC LEAD LOCATION: 753859
MDC IDC MSMT BATTERY REMAINING LONGEVITY: 154 mo
MDC IDC MSMT LEADCHNL RA IMPEDANCE VALUE: 494 Ohm
MDC IDC MSMT LEADCHNL RA PACING THRESHOLD PULSEWIDTH: 0.4 ms
MDC IDC MSMT LEADCHNL RV PACING THRESHOLD AMPLITUDE: 1 V
MDC IDC MSMT LEADCHNL RV SENSING INTR AMPL: 20.625 mV
MDC IDC SET LEADCHNL RV PACING AMPLITUDE: 2.5 V
MDC IDC SET LEADCHNL RV PACING PULSEWIDTH: 0.4 ms
MDC IDC SET LEADCHNL RV SENSING SENSITIVITY: 0.9 mV
MDC IDC STAT BRADY AS VS PERCENT: 0.06 %
MDC IDC STAT BRADY RV PERCENT PACED: 0.04 %

## 2017-01-12 NOTE — Progress Notes (Signed)
Wound check appointment. Steri-strips removed. Firm hematoma noted over device site, no drainage or erythema noted, green to yellowish ecchymosis extending below site toward abdomen. Incision edges approximated and healing well. Per Dr. Lovena Le, pt to hold warfarin and ASA until wound recheck next week, pt to eat greens today and tomorrow. Coumadin Clinic made aware. Pt aware to proceed to [facility] ED if bleeding from incision, and aware to call with any signs/symptoms of infection.  Normal device function. Thresholds, sensing, and impedances consistent with implant measurements. Device programmed at chronic outputs, RA min output increased to 2.0V, RV min output increased to 2.5V. Max RA and RV pacing impedances reprogrammed to 2000ohms. Histogram distribution appropriate for patient and level of activity. No mode switches or high ventricular rates noted. AT/AF daily burden alert turned off, known history of AF, +sotalol and warfarin. Patient educated about wound care, arm mobility, lifting restrictions. ROV with Device Clinic on 01/19/17 for wound recheck while Dr. Lovena Le in the office, [place] with GT/R on 04/11/17 for 3 month f/u.

## 2017-01-12 NOTE — Patient Instructions (Addendum)
Medication Instructions:   -STOP your warfarin and aspirin until instructed to resume by Dr. Lovena Le.  -Eat greens today and tomorrow with lunch.  Labwork: N/A  Testing/Procedures: N/A  Follow-Up:  -Return for a wound recheck on Wednesday, 5/30 at 10:00am.  Dr. Lovena Le will give you further instructions regarding restarting your warfarin at this appointment.  -Raquel Sarna will make Coumadin Clinic aware of medication changes.    -Avoid strenuous upper body activity and overuse of your left arm until instructed otherwise.  If you need a refill on your cardiac medications before your next appointment, please call your pharmacy.

## 2017-01-16 ENCOUNTER — Telehealth: Payer: Self-pay | Admitting: Cardiology

## 2017-01-16 NOTE — Telephone Encounter (Signed)
Pt called with a fine trickle of blood from pacer site, site is swollen but has been since procedure.  Blood not very bright red but not brown, I discussed with Dr. Rayann Heman- will have pt try pressure bandage.  If continues to bleed he will call and will send to ER.  Otherwise will try to move up appt from Wed to Tuesday.  Pt agreeable.

## 2017-01-18 ENCOUNTER — Ambulatory Visit (INDEPENDENT_AMBULATORY_CARE_PROVIDER_SITE_OTHER): Payer: Self-pay | Admitting: *Deleted

## 2017-01-18 DIAGNOSIS — I480 Paroxysmal atrial fibrillation: Secondary | ICD-10-CM

## 2017-01-18 MED ORDER — CEPHALEXIN 500 MG PO CAPS: 500.0000 mg | ORAL_CAPSULE | Freq: Three times a day (TID) | ORAL | 0 refills | Status: DC

## 2017-01-18 NOTE — Progress Notes (Signed)
Wound recheck in office d/t bleeding from incision since Sunday. Gauze removed from device site. Small un-approximated area noted mid incision. No underlying stitch visible. Hematoma is soft and a lot smaller than it was last week (per patient). Yellow/green ecchymosis noted around the device pocket, and dark red ecchymosis noted just below the nipple. Chanetta Marshall, NP assessed wound, and was able to express small amount of dark red blood from hematoma. Per [employer], apply pressure dressing today and begin Keflex 500mg  TID x 7 days. Patient to continue to hold ASA and warfarin until further notice. Patient to follow up with Dr.Taylor Linna Hoff) on 5/31 @ 0915 for a wound recheck. I encouraged patient to call if the pressure dressing becomes saturated with blood or if he notices any signs or sx's of infection. Patient verbalized understanding.

## 2017-01-19 ENCOUNTER — Ambulatory Visit: Payer: Medicare Other

## 2017-01-20 ENCOUNTER — Ambulatory Visit (INDEPENDENT_AMBULATORY_CARE_PROVIDER_SITE_OTHER): Payer: Medicare Other | Admitting: Internal Medicine

## 2017-01-20 ENCOUNTER — Encounter: Payer: Self-pay | Admitting: Internal Medicine

## 2017-01-20 VITALS — BP 157/87 | HR 76 | Ht 66.0 in | Wt 150.2 lb

## 2017-01-20 DIAGNOSIS — M96841 Postprocedural hematoma of a musculoskeletal structure following other procedure: Secondary | ICD-10-CM | POA: Diagnosis not present

## 2017-01-20 DIAGNOSIS — I4891 Unspecified atrial fibrillation: Secondary | ICD-10-CM

## 2017-01-20 NOTE — Progress Notes (Signed)
HPI [name] returns today for followup after undergoing PPM generator change out. He was taking both ASA and warfarin and developed a large pocket hematoma after his procedure. He started to ooze from the pocket 5 days ago. He came to our office and was rebandaged 2 days ago. He has been stable since with minimal additional bleeding. His pocket is currently only minimally swollen. He denies fever and chills and feels ok. Allergies  Allergen Reactions  . Fexofenadine Hcl Palpitations    Allegra     Current Outpatient Prescriptions  Medication Sig Dispense Refill  . atorvastatin (LIPITOR) 80 MG tablet TAKE ONE TABLET BY MOUTH DAILY. (Patient taking differently: TAKE ONE TABLET BY MOUTH DAILY IN THE EVENING) 90 tablet 3  . cephALEXin (KEFLEX) 500 MG capsule Take 1 capsule (500 mg total) by mouth 3 (three) times daily. 21 capsule 0  . diltiazem (CARDIZEM CD) 240 MG 24 hr capsule Take 240 mg by mouth at bedtime.     Marland Kitchen levothyroxine (SYNTHROID, LEVOTHROID) 75 MCG tablet Take 75 mcg by mouth daily before breakfast.    . losartan (COZAAR) 50 MG tablet TAKE 1 TABLET BY MOUTH ONCE DAILY. 90 tablet 3  . saw palmetto 500 MG capsule Take 500 mg by mouth 2 (two) times daily.     . sotalol (BETAPACE) 160 MG tablet TAKE (1) TABLET BY MOUTH TWICE DAILY. 180 tablet 3  . aspirin EC 81 MG tablet Take 81 mg by mouth 2 (two) times daily.    Marland Kitchen warfarin (COUMADIN) 5 MG tablet Take 1 tablet daily except 1 1/2 tablets on Thursdays (Patient not taking: Reported on 01/20/2017) 120 tablet 3   No current facility-administered medications for this visit.      Past Medical History:  Diagnosis Date  . AAA (abdominal aortic aneurysm) ([place])   . Arrhythmia   . Atrial fibrillation ([place])   . Back pain, chronic   . Coronary artery disease 1996 and 1997   angioplasty of LAD  . Diabetes mellitus without complication ([place])    MILD TYPE 2  . High cholesterol   . Hypertension   . Left shoulder pain 2014  .  Sleep apnea Sept. 2008   USES C-PAP    ROS:   All systems reviewed and negative except as noted in the HPI.   Past Surgical History:  Procedure Laterality Date  . CARDIAC CATHETERIZATION  1996  . CARDIAC CATHETERIZATION  06/18/1996   mid-circ tandem overlying stents with 20%,70%mid-LAD naroowin stent site in circ with 70% stenosis in the mid to distal marginal branch,tandem 95% and 80% stenosis in the proximal portion of the posterior descending branch of RCA   . CATARACT EXTRACTION  1983  . COLONOSCOPY N/A 09/04/2014   Procedure: COLONOSCOPY;  Surgeon: [name], MD;  [facility];  Service: Endoscopy;  Laterality: N/A;  1030  . CORONARY ANGIOPLASTY WITH STENT PLACEMENT  08/18/1995   PTA/STENT CIRC  . CORONARY ANGIOPLASTY WITH STENT PLACEMENT  06/18/1996   PTCA  posterior descending  branch of the RCA 95 AND 80% TO LESSTHAN 20%  . [place]  . DOPPLER ECHOCARDIOGRAPHY  04/17/2010   EF =50-55%; mild concentric LVH,LAE, AORTIC SCLEROSIS.TRACE TO MILD CENTRAL AI, TRACE MITRAL REGURGITATION, CANNOT ASSESS DIASTOLIC FUNCTIONDUE TO UNDERLYING RHYTHM,PACERMAKER WIRE IN THE RA/RV  . DOPPLER ECHOCARDIOGRAPHY  04/21/2007   DONE IN NEW BERN-- MILD MITRAL REGURG., MILD TRICUSPID REGURG. WITH MILD PULM. HTN, MILD CONCENTRIC LEFT VENTRICULAR  HYPERTROPHY,MILD LEFT ATRIAL ENLARGEMENT.  . EYE SURGERY  1983   Cataract  . HERNIA REPAIR Bilateral 2000  . INSERT / REPLACE / REMOVE PACEMAKER  SEPT 2008   INSERT DUAL -CHAMBER-medtronic adapta ADDR01 serial   [id number]  . NM MYOCAR MULTIPLE W/SPECT  04/17/2010   EF 50%; LOW NORMAL LV FUNCTION,  . PACEMAKER INSERTION  2008   DUAL-CHAMBER  . PPM GENERATOR CHANGEOUT N/A 12/29/2016   Procedure: PPM Generator Changeout;  Surgeon: Evans Lance, MD;  Location: [facility];  Service: Cardiovascular;  Laterality: N/A;     Family History  Problem Relation Age of Onset  . Peripheral vascular disease Mother   .  Hypertension Mother   . Diabetes Mother   . Heart disease Mother   . Hyperlipidemia Mother   . Peripheral vascular disease Sister   . Cancer Sister   . Diabetes Sister   . Heart disease Sister        Stent- Heart Disease before age 39  . Hyperlipidemia Sister   . Hypertension Sister   . Peripheral vascular disease Brother   . Diabetes Brother   . Heart disease Brother        CHF and Heart Disease before age 18  . Hyperlipidemia Brother   . Hypertension Brother   . Stroke Father 25       TIA     Social History   Social History  . Marital status: Married    Spouse name: N/A  . Number of children: N/A  . Years of education: N/A   Occupational History  . Not on file.   Social History Main Topics  . Smoking status: Never Smoker  . Smokeless tobacco: Never Used  . Alcohol use No  . Drug use: No  . Sexual activity: Not on file   Other Topics Concern  . Not on file   Social History Narrative   Controls his diabetes through diet and exercise.     BP (!) 157/87   Pulse 76   Ht 5\' 6"  (1.676 m)   Wt 150 lb 3.2 oz (68.1 kg)   BMI 24.24 kg/m   Physical Exam:  Well appearing NAD HEENT: Unremarkable Neck:  No JVD, no thyromegally Lymphatics:  No adenopathy Back:  No CVA tenderness Lungs:  Clear with no wheezes. He has ecchymosis and a small residual hematoma.  HEART:  Regular rate rhythm, no murmurs, no rubs, no clicks Abd:  soft, positive bowel sounds, no organomegally, no rebound, no guarding Ext:  2 plus pulses, no edema, no cyanosis, no clubbing Skin:  No rashes no nodules Neuro:  CN II through XII intact, motor grossly intact   Assess/Plan: 1. PM pocket hematoma after PM generator change out - he is stable at the moment. I have offered him pocket evacuation vs watchful waiting. He prefers the latter. If his pocket continues to ooze, I have asked that he come back to the hospital tomorrow for hematoma evacuation and attempted salvage of the system. If no  additional bleeding will undergo watchful waiting. He is instructed not to take any additional anti-coagulation or ASA until I see him back next week. 2. PAF - he is maintaining NSR on sotalol. 3. HTN - his blood pressure is elevated today. We will consider uptitration of his medical therapy. He is encouraged to maintain a low sodium diet.  Johnny Reynolds.D.

## 2017-01-20 NOTE — Patient Instructions (Addendum)
Medication Instructions:   Your physician recommends that you continue on your current medications as directed. Please refer to the Current Medication list given to you today.   Labwork:  NONE  Testing/Procedures:  NONE   Follow-Up:  Your physician recommends that you schedule a follow-up appointment next week with Dr. Lovena Le.   Any Other Special Instructions Will Be Listed Below (If Applicable).  Please call [phone] (device clinic) in the morning if you have any drainage or significant swelling to get arranged to go to Short Stay.   If you need a refill on your cardiac medications before your next appointment, please call your pharmacy.

## 2017-01-27 ENCOUNTER — Ambulatory Visit (INDEPENDENT_AMBULATORY_CARE_PROVIDER_SITE_OTHER): Payer: Self-pay | Admitting: Internal Medicine

## 2017-01-27 ENCOUNTER — Encounter: Payer: Self-pay | Admitting: Internal Medicine

## 2017-01-27 VITALS — BP 118/68 | HR 69 | Ht 66.0 in | Wt 145.0 lb

## 2017-01-27 DIAGNOSIS — T82837D Hemorrhage of cardiac prosthetic devices, implants and grafts, subsequent encounter: Secondary | ICD-10-CM

## 2017-01-27 NOTE — Patient Instructions (Signed)
Your physician recommends that you schedule a follow-up appointment on 8/20 with Dr. Lovena Le.   Your physician recommends that you continue on your current medications as directed. Please refer to the Current Medication list given to you today.  If you need a refill on your cardiac medications before your next appointment, please call your pharmacy.  Thank you for choosing [facility]!

## 2017-01-27 NOTE — Progress Notes (Signed)
[name] returns today for PM wound check. He has had no more drainage and denies fever and chills. He feels well but is anxious.   Exam reveals no pocket hematoma and the incision is closed with a small indentation in the middle of the incision line. No hematoma.  Plan - he will undergo watchful waiting. I will see him in August. I have reminded him of what to look for and when to go to the hospital. Specifically if he has fever or chills or symptoms of a system infection.  Mikle Bosworth.D.

## 2017-02-01 ENCOUNTER — Ambulatory Visit (INDEPENDENT_AMBULATORY_CARE_PROVIDER_SITE_OTHER): Payer: Medicare Other | Admitting: *Deleted

## 2017-02-01 DIAGNOSIS — Z5181 Encounter for therapeutic drug level monitoring: Secondary | ICD-10-CM

## 2017-02-01 DIAGNOSIS — I4891 Unspecified atrial fibrillation: Secondary | ICD-10-CM | POA: Diagnosis not present

## 2017-02-01 LAB — POCT INR: INR: 1.3

## 2017-02-14 ENCOUNTER — Ambulatory Visit: Payer: Medicare Other | Admitting: Cardiology

## 2017-02-15 ENCOUNTER — Ambulatory Visit (INDEPENDENT_AMBULATORY_CARE_PROVIDER_SITE_OTHER): Payer: Medicare Other | Admitting: *Deleted

## 2017-02-15 ENCOUNTER — Ambulatory Visit (INDEPENDENT_AMBULATORY_CARE_PROVIDER_SITE_OTHER): Payer: Medicare Other | Admitting: Cardiology

## 2017-02-15 ENCOUNTER — Encounter: Payer: Self-pay | Admitting: Cardiology

## 2017-02-15 VITALS — BP 130/82 | HR 72 | Ht 66.0 in | Wt 147.8 lb

## 2017-02-15 DIAGNOSIS — I714 Abdominal aortic aneurysm, without rupture, unspecified: Secondary | ICD-10-CM

## 2017-02-15 DIAGNOSIS — E782 Mixed hyperlipidemia: Secondary | ICD-10-CM

## 2017-02-15 DIAGNOSIS — I4891 Unspecified atrial fibrillation: Secondary | ICD-10-CM | POA: Diagnosis not present

## 2017-02-15 DIAGNOSIS — Z5181 Encounter for therapeutic drug level monitoring: Secondary | ICD-10-CM | POA: Diagnosis not present

## 2017-02-15 DIAGNOSIS — I2510 Atherosclerotic heart disease of native coronary artery without angina pectoris: Secondary | ICD-10-CM | POA: Diagnosis not present

## 2017-02-15 DIAGNOSIS — I480 Paroxysmal atrial fibrillation: Secondary | ICD-10-CM | POA: Diagnosis not present

## 2017-02-15 LAB — POCT INR: INR: 2.8

## 2017-02-15 NOTE — Progress Notes (Signed)
Clinical Summary Mr. Tozzi is a 76 y.o.male seen today for follow up of the following medical problems  1. AAA  - followed by vascular   2. HTN  - He has had some white coat HTN in the past  - remains compliant with meds - at home bp's 110s/60s  3. Parox Afib  -on sotalol 160 mg bid, on approx since 2010.  - Has not been interested in NOACs.  - no recent palpitations. No recent troubles on coumadin.   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  - normal pacemaker check 04/2016  - recent generator change by Dr Lovena Le. Developed pocket hematoma. At this time watchful waiting, hematoma has been improving.   5. CAD  -remote history per old clinic notes, prior angioplasty in 1996 and [facility] at [facility]   - no recent chest pain, denies SOB or DOE    6. Hyperlipidemia  - he is compliant with lipitor - upcoming labs with pcp.   7. 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  2 grand daugthers just graduated from high school. Both are heading to college, one to [employer] and the other to a local school in [place].     Past Medical History:  Diagnosis Date  . AAA (abdominal aortic aneurysm) ([place])   . Arrhythmia   . Atrial fibrillation ([place])   . Back pain, chronic   . Coronary artery disease 1996 and 1997   angioplasty of LAD  . Diabetes mellitus without complication ([place])    MILD TYPE 2  . High cholesterol   . Hypertension   . Left shoulder pain 2014  . Sleep apnea Sept. 2008   USES C-PAP     Allergies  Allergen Reactions  . Fexofenadine Hcl Palpitations    Allegra     Current Outpatient Prescriptions  Medication Sig Dispense Refill  . aspirin EC 81 MG tablet Take 81 mg by mouth 2 (two) times daily.    Marland Kitchen atorvastatin (LIPITOR) 80  MG tablet TAKE ONE TABLET BY MOUTH DAILY. (Patient taking differently: TAKE ONE TABLET BY MOUTH DAILY IN THE EVENING) 90 tablet 3  . cephALEXin (KEFLEX) 500 MG capsule Take 1 capsule (500 mg total) by mouth 3 (three) times daily. 21 capsule 0  . diltiazem (CARDIZEM CD) 240 MG 24 hr capsule Take 240 mg by mouth at bedtime.     Marland Kitchen levothyroxine (SYNTHROID, LEVOTHROID) 75 MCG tablet Take 75 mcg by mouth daily before breakfast.    . losartan (COZAAR) 50 MG tablet TAKE 1 TABLET BY MOUTH ONCE DAILY. 90 tablet 3  . saw palmetto 500 MG capsule Take 500 mg by mouth 2 (two) times daily.     . sotalol (BETAPACE) 160 MG tablet TAKE (1) TABLET BY MOUTH TWICE DAILY. 180 tablet 3  . warfarin (COUMADIN) 5 MG tablet Take 1 tablet daily except 1 1/2 tablets on Thursdays 120 tablet 3   No current facility-administered medications for this visit.      Past Surgical History:  Procedure Laterality Date  . CARDIAC CATHETERIZATION  1996  . CARDIAC CATHETERIZATION  06/18/1996   mid-circ tandem overlying stents with 20%,70%mid-LAD naroowin stent site in circ with 70% stenosis in the mid to distal marginal Gavrielle Streck,tandem 95% and 80% stenosis in the proximal portion of the posterior  descending Zaidy Absher of RCA   . CATARACT EXTRACTION  1983  . COLONOSCOPY N/A 09/04/2014   Procedure: COLONOSCOPY;  Surgeon: [name], MD;  [facility];  Service: Endoscopy;  Laterality: N/A;  1030  . CORONARY ANGIOPLASTY WITH STENT PLACEMENT  08/18/1995   PTA/STENT CIRC  . CORONARY ANGIOPLASTY WITH STENT PLACEMENT  06/18/1996   PTCA  posterior descending  Dereon Corkery of the RCA 95 AND 80% TO LESSTHAN 20%  . [place]  . DOPPLER ECHOCARDIOGRAPHY  04/17/2010   EF =50-55%; mild concentric LVH,LAE, AORTIC SCLEROSIS.TRACE TO MILD CENTRAL AI, TRACE MITRAL REGURGITATION, CANNOT ASSESS DIASTOLIC FUNCTIONDUE TO UNDERLYING RHYTHM,PACERMAKER WIRE IN THE RA/RV  . DOPPLER ECHOCARDIOGRAPHY  04/21/2007   DONE IN [place] B[place]--  MILD MITRAL REGURG., MILD TRICUSPID REGURG. WITH MILD PULM. HTN, MILD CONCENTRIC LEFT VENTRICULAR HYPERTROPHY,MILD LEFT ATRIAL ENLARGEMENT.  . EYE SURGERY  1983   Cataract  . HERNIA REPAIR Bilateral 2000  . INSERT / REPLACE / REMOVE PACEMAKER  SEPT 2008   INSERT DUAL -CHAMBER-medtronic adapta ADDR01 serial   [id number]  . NM MYOCAR MULTIPLE W/SPECT  04/17/2010   EF 50%; LOW NORMAL LV FUNCTION,  . PACEMAKER INSERTION  2008   DUAL-CHAMBER  . PPM GENERATOR CHANGEOUT N/A 12/29/2016   Procedure: PPM Generator Changeout;  Surgeon: Evans Lance, MD;  Location: [facility];  Service: Cardiovascular;  Laterality: N/A;     Allergies  Allergen Reactions  . Fexofenadine Hcl Palpitations    Allegra      Family History  Problem Relation Age of Onset  . Peripheral vascular disease Mother   . Hypertension Mother   . Diabetes Mother   . Heart disease Mother   . Hyperlipidemia Mother   . Peripheral vascular disease Sister   . Cancer Sister   . Diabetes Sister   . Heart disease Sister        Stent- Heart Disease before age 72  . Hyperlipidemia Sister   . Hypertension Sister   . Peripheral vascular disease Brother   . Diabetes Brother   . Heart disease Brother        CHF and Heart Disease before age 9  . Hyperlipidemia Brother   . Hypertension Brother   . Stroke Father 41       TIA     Social History Mr. Uher reports that he has never smoked. He has never used smokeless tobacco. Mr. Clabo reports that he does not drink alcohol.   Review of Systems CONSTITUTIONAL: No weight loss, fever, chills, weakness or fatigue.  HEENT: Eyes: No visual loss, blurred vision, double vision or yellow sclerae.No hearing loss, sneezing, congestion, runny nose or sore throat.  SKIN: No rash or itching.  CARDIOVASCULAR: per hpi RESPIRATORY: No shortness of breath, cough or sputum.  GASTROINTESTINAL: No anorexia, nausea, vomiting or diarrhea. No abdominal pain or blood.  GENITOURINARY: No  burning on urination, no polyuria NEUROLOGICAL: No headache, dizziness, syncope, paralysis, ataxia, numbness or tingling in the extremities. No change in bowel or bladder control.  MUSCULOSKELETAL: No muscle, back pain, joint pain or stiffness.  LYMPHATICS: No enlarged nodes. No history of splenectomy.  PSYCHIATRIC: No history of depression or anxiety.  ENDOCRINOLOGIC: No reports of sweating, cold or heat intolerance. No polyuria or polydipsia.  Marland Kitchen   Physical Examination Vitals:   02/15/17 0927  BP: 130/82  Pulse: 72   Vitals:   02/15/17 0927  Weight: 147 lb 12.8 oz (67 kg)  Height: 5\' 6"  (  1.676 m)    Gen: resting comfortably, no acute distress HEENT: no scleral icterus, pupils equal round and reactive, no palptable cervical adenopathy,  CV: RRR, no m/r/g, no jvd Resp: Clear to auscultation bilaterally GI: abdomen is soft, non-tender, non-distended, normal bowel sounds, no hepatosplenomegaly MSK: extremities are warm, no edema.  Skin: warm, no rash Neuro:  no focal deficits Psych: appropriate affect   Diagnostic Studies  Echo 03/2010 [facility]: LVEF 50-55%, mild LVH, LAE   03/2010 [facility]: no ischemia, LVEF 50%.   02/06/14 EKG Atrial paced, QTc 490    Assessment and Plan  1. AAA,  -continue to follow w/ vascular.   2. CAD  - no recent symptoms -continue current meds  3. Parox afib  - no recent  symptoms. - CHAD2Vasc score of 4, continue anticoag  4. Tachy-brady syndrome  - continue to follow in device clinic  5. HL: - continue high dose statin in setting of CAD - request labs from his primary  6. HTN - bp is at goal, continue current meds      Arnoldo Lenis, M.D

## 2017-02-15 NOTE — Patient Instructions (Signed)
Your physician wants you to follow-up in: 6 months Dr Bryna Colander will receive a reminder letter in the mail two months in advance. If you don't receive a letter, please call our office to schedule the follow-up appointment.   Your physician recommends that you continue on your current medications as directed. Please refer to the Current Medication list given to you today.     If you need a refill on your cardiac medications before your next appointment, please call your pharmacy.    No lab work or testing ordered today.     Thank you for choosing [facility] !

## 2017-02-21 ENCOUNTER — Other Ambulatory Visit: Payer: Self-pay | Admitting: Cardiology

## 2017-02-21 ENCOUNTER — Other Ambulatory Visit: Payer: Self-pay | Admitting: Internal Medicine

## 2017-03-15 ENCOUNTER — Ambulatory Visit (INDEPENDENT_AMBULATORY_CARE_PROVIDER_SITE_OTHER): Payer: Medicare Other | Admitting: *Deleted

## 2017-03-15 DIAGNOSIS — I4891 Unspecified atrial fibrillation: Secondary | ICD-10-CM

## 2017-03-15 DIAGNOSIS — Z5181 Encounter for therapeutic drug level monitoring: Secondary | ICD-10-CM | POA: Diagnosis not present

## 2017-03-15 DIAGNOSIS — I2510 Atherosclerotic heart disease of native coronary artery without angina pectoris: Secondary | ICD-10-CM

## 2017-03-15 LAB — POCT INR: INR: 2.7

## 2017-03-23 ENCOUNTER — Other Ambulatory Visit: Payer: Self-pay | Admitting: Cardiology

## 2017-04-11 ENCOUNTER — Ambulatory Visit (INDEPENDENT_AMBULATORY_CARE_PROVIDER_SITE_OTHER): Payer: Medicare Other | Admitting: Internal Medicine

## 2017-04-11 ENCOUNTER — Ambulatory Visit (INDEPENDENT_AMBULATORY_CARE_PROVIDER_SITE_OTHER): Payer: Medicare Other | Admitting: *Deleted

## 2017-04-11 ENCOUNTER — Encounter: Payer: Self-pay | Admitting: Internal Medicine

## 2017-04-11 VITALS — BP 140/78 | HR 78 | Ht 66.0 in | Wt 146.0 lb

## 2017-04-11 DIAGNOSIS — R001 Bradycardia, unspecified: Secondary | ICD-10-CM | POA: Diagnosis not present

## 2017-04-11 DIAGNOSIS — I2510 Atherosclerotic heart disease of native coronary artery without angina pectoris: Secondary | ICD-10-CM

## 2017-04-11 DIAGNOSIS — Z950 Presence of cardiac pacemaker: Secondary | ICD-10-CM | POA: Diagnosis not present

## 2017-04-11 DIAGNOSIS — Z5181 Encounter for therapeutic drug level monitoring: Secondary | ICD-10-CM

## 2017-04-11 DIAGNOSIS — I480 Paroxysmal atrial fibrillation: Secondary | ICD-10-CM

## 2017-04-11 DIAGNOSIS — I4891 Unspecified atrial fibrillation: Secondary | ICD-10-CM | POA: Diagnosis not present

## 2017-04-11 LAB — CUP PACEART INCLINIC DEVICE CHECK
Brady Statistic AP VP Percent: 0.05 %
Brady Statistic AS VP Percent: 0 %
Brady Statistic RA Percent Paced: 99.65 %
Brady Statistic RV Percent Paced: 0.12 %
Date Time Interrogation Session: 20180820094310
Implantable Lead Implant Date: 20080902
Implantable Lead Location: 753860
Implantable Lead Model: 5076
Lead Channel Impedance Value: 399 Ohm
Lead Channel Impedance Value: 494 Ohm
Lead Channel Pacing Threshold Amplitude: 0.75 V
Lead Channel Pacing Threshold Pulse Width: 0.4 ms
Lead Channel Setting Pacing Pulse Width: 0.4 ms
MDC IDC LEAD IMPLANT DT: 20080902
MDC IDC LEAD LOCATION: 753859
MDC IDC MSMT BATTERY REMAINING LONGEVITY: 152 mo
MDC IDC MSMT BATTERY VOLTAGE: 3.17 V
MDC IDC MSMT LEADCHNL RA IMPEDANCE VALUE: 361 Ohm
MDC IDC MSMT LEADCHNL RA IMPEDANCE VALUE: 513 Ohm
MDC IDC MSMT LEADCHNL RA PACING THRESHOLD PULSEWIDTH: 0.4 ms
MDC IDC MSMT LEADCHNL RV PACING THRESHOLD AMPLITUDE: 1 V
MDC IDC MSMT LEADCHNL RV SENSING INTR AMPL: 25.125 mV
MDC IDC PG IMPLANT DT: 20180509
MDC IDC SET LEADCHNL RA PACING AMPLITUDE: 2 V
MDC IDC SET LEADCHNL RV PACING AMPLITUDE: 2.5 V
MDC IDC SET LEADCHNL RV SENSING SENSITIVITY: 0.9 mV
MDC IDC STAT BRADY AP VS PERCENT: 99.67 %
MDC IDC STAT BRADY AS VS PERCENT: 0.28 %

## 2017-04-11 LAB — POCT INR: INR: 2.1

## 2017-04-11 NOTE — Progress Notes (Signed)
HPI Johnny Reynolds returns today for follow-up. He is a very pleasant 76 year old man with symptomatic sinus node dysfunction status post pacemaker insertion, paroxysmal atrial fibrillation, on warfarin, who developed a large pacemaker pocket hematoma after undergoing pacemaker generator change out several months ago. We held his anticoagulation, and he eventually healed up. He is back to his usual activity. 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 He denies chest pain, shortness of breath, or syncope. No peripheral edema. No fevers or chills. Allergies  Allergen Reactions  . Fexofenadine Hcl Palpitations    Allegra     Current Outpatient Prescriptions  Medication Sig Dispense Refill  . atorvastatin (LIPITOR) 80 MG tablet TAKE ONE TABLET BY MOUTH DAILY. 90 tablet 3  . diltiazem (CARDIZEM CD) 240 MG 24 hr capsule Take 240 mg by mouth at bedtime.     Marland Kitchen levothyroxine (SYNTHROID, LEVOTHROID) 75 MCG tablet Take 75 mcg by mouth daily before breakfast.    . losartan (COZAAR) 50 MG tablet TAKE 1 TABLET BY MOUTH ONCE DAILY. 90 tablet 3  . saw palmetto 500 MG capsule Take 500 mg by mouth 2 (two) times daily.     . sotalol (BETAPACE) 160 MG tablet TAKE (1) TABLET BY MOUTH TWICE DAILY. 180 tablet 3  . warfarin (COUMADIN) 5 MG tablet TAKE 1 TABLET BY MOUTH DAILY EXCEPT ON THURSDAYS TAKE 1 AND 1/2 TABLETS. 120 tablet 0   No current facility-administered medications for this visit.      Past Medical History:  Diagnosis Date  . AAA (abdominal aortic aneurysm) ([place])   . Arrhythmia   . Atrial fibrillation ([place])   . Back pain, chronic   . Coronary artery disease 1996 and 1997   angioplasty of LAD  . Diabetes mellitus without complication ([place])    MILD TYPE 2  . High cholesterol   . Hypertension   . Left shoulder pain 2014  . Sleep apnea Sept. 2008   USES C-PAP    ROS:   All systems reviewed and negative except as noted in the HPI.   Past Surgical History:  Procedure  Laterality Date  . CARDIAC CATHETERIZATION  1996  . CARDIAC CATHETERIZATION  06/18/1996   mid-circ tandem overlying stents with 20%,70%mid-LAD naroowin stent site in circ with 70% stenosis in the mid to distal marginal branch,tandem 95% and 80% stenosis in the proximal portion of the posterior descending branch of RCA   . CATARACT EXTRACTION  1983  . COLONOSCOPY N/A 09/04/2014   Procedure: COLONOSCOPY;  Surgeon: [name], MD;  [facility];  Service: Endoscopy;  Laterality: N/A;  1030  . CORONARY ANGIOPLASTY WITH STENT PLACEMENT  08/18/1995   PTA/STENT CIRC  . CORONARY ANGIOPLASTY WITH STENT PLACEMENT  06/18/1996   PTCA  posterior descending  branch of the RCA 95 AND 80% TO LESSTHAN 20%  . [place]  . DOPPLER ECHOCARDIOGRAPHY  04/17/2010   EF =50-55%; mild concentric LVH,LAE, AORTIC SCLEROSIS.TRACE TO MILD CENTRAL AI, TRACE MITRAL REGURGITATION, CANNOT ASSESS DIASTOLIC FUNCTIONDUE TO UNDERLYING RHYTHM,PACERMAKER WIRE IN THE RA/RV  . DOPPLER ECHOCARDIOGRAPHY  04/21/2007   DONE IN [place]-- MILD MITRAL REGURG., MILD TRICUSPID REGURG. WITH MILD PULM. HTN, MILD CONCENTRIC LEFT VENTRICULAR HYPERTROPHY,MILD LEFT ATRIAL ENLARGEMENT.  . EYE SURGERY  1983   Cataract  . HERNIA REPAIR Bilateral 2000  . INSERT / REPLACE / REMOVE PACEMAKER  SEPT 2008   INSERT DUAL -CHAMBER-medtronic adapta ADDR01 serial   [id number]  .  NM MYOCAR MULTIPLE W/SPECT  04/17/2010   EF 50%; LOW NORMAL LV FUNCTION,  . PACEMAKER INSERTION  2008   DUAL-CHAMBER  . PPM GENERATOR CHANGEOUT N/A 12/29/2016   Procedure: PPM Generator Changeout;  Surgeon: Evans Lance, MD;  Location: [facility];  Service: Cardiovascular;  Laterality: N/A;     Family History  Problem Relation Age of Onset  . Peripheral vascular disease Mother   . Hypertension Mother   . Diabetes Mother   . Heart disease Mother   . Hyperlipidemia Mother   . Peripheral vascular disease Sister   . Cancer Sister   .  Diabetes Sister   . Heart disease Sister        Stent- Heart Disease before age 64  . Hyperlipidemia Sister   . Hypertension Sister   . Peripheral vascular disease Brother   . Diabetes Brother   . Heart disease Brother        CHF and Heart Disease before age 22  . Hyperlipidemia Brother   . Hypertension Brother   . Stroke Father 65       TIA     Social History   Social History  . Marital status: Married    Spouse name: N/A  . Number of children: N/A  . Years of education: N/A   Occupational History  . Not on file.   Social History Main Topics  . Smoking status: Never Smoker  . Smokeless tobacco: Never Used  . Alcohol use No  . Drug use: No  . Sexual activity: Not on file   Other Topics Concern  . Not on file   Social History Narrative   Controls his diabetes through diet and exercise.     BP 140/78   Pulse 78   Ht 5\' 6"  (1.676 m)   Wt 146 lb (66.2 kg)   SpO2 98%   BMI 23.57 kg/m   Physical Exam:  Well appearing 76 year old man, NAD HEENT: Unremarkable Neck:  No JVD, no thyromegally Lymphatics:  No adenopathy Back:  No CVA tenderness Lungs:  Clear, with no wheezes, rales, or rhonchi. Well-healed pacemaker incision. HEART:  Regular rate rhythm, no murmurs, no rubs, no clicks Abd:  soft, positive bowel sounds, no organomegally, no rebound, no guarding Ext:  2 plus pulses, no edema, no cyanosis, no clubbing Skin:  No rashes no nodules Neuro:  CN II through XII intact, motor grossly intact  DEVICE  Normal device function.  See PaceArt for details. His Medtronic dual-chamber pacemaker is working normally  Assess/Plan: 1. Paroxysmal atrial fibrillation - he is maintaining sinus rhythm very nicely on sotalol and will continue his systemic anticoagulation. 2. Sinus node dysfunction - he is asymptomatic, status post permit a pacemaker insertion. He had no escape today at 30 bpm. 3. Coronary artery disease - he denies anginal symptoms and is physically  active exercising several times a week.  Cristopher Peru, M.D.

## 2017-04-11 NOTE — Patient Instructions (Signed)
Medication Instructions:  Your physician recommends that you continue on your current medications as directed. Please refer to the Current Medication list given to you today.   Labwork: NONE  Testing/Procedures: NONE  Follow-Up: Your physician wants you to follow-up in: 9 MONTHS You will receive a reminder letter in the mail two months in advance. If you don't receive a letter, please call our office to schedule the follow-up appointment.  Remote monitoring is used to monitor your Pacemaker of ICD from home. This monitoring reduces the number of office visits required to check your device to one time per year. It allows [place] to keep an eye on the functioning of your device to ensure it is working properly. You are scheduled for a device check from home on 07/11/17. You may send your transmission at any time that day. If you have a wireless device, the transmission will be sent automatically. After your physician reviews your transmission, you will receive a postcard with your next transmission date.     Any Other Special Instructions Will Be Listed Below (If Applicable).     If you need a refill on your cardiac medications before your next appointment, please call your pharmacy.  Thank you for choosing [facility]!

## 2017-05-26 ENCOUNTER — Ambulatory Visit (INDEPENDENT_AMBULATORY_CARE_PROVIDER_SITE_OTHER): Payer: Medicare Other | Admitting: *Deleted

## 2017-05-26 DIAGNOSIS — Z5181 Encounter for therapeutic drug level monitoring: Secondary | ICD-10-CM

## 2017-05-26 DIAGNOSIS — I4891 Unspecified atrial fibrillation: Secondary | ICD-10-CM

## 2017-05-26 LAB — POCT INR: INR: 2.6

## 2017-06-01 ENCOUNTER — Encounter: Payer: Self-pay | Admitting: Neurology

## 2017-06-01 ENCOUNTER — Ambulatory Visit (INDEPENDENT_AMBULATORY_CARE_PROVIDER_SITE_OTHER): Payer: Medicare Other | Admitting: Neurology

## 2017-06-01 VITALS — BP 153/86 | HR 79 | Ht 66.0 in | Wt 148.0 lb

## 2017-06-01 DIAGNOSIS — I2510 Atherosclerotic heart disease of native coronary artery without angina pectoris: Secondary | ICD-10-CM | POA: Diagnosis not present

## 2017-06-01 DIAGNOSIS — I714 Abdominal aortic aneurysm, without rupture, unspecified: Secondary | ICD-10-CM

## 2017-06-01 DIAGNOSIS — Z45018 Encounter for adjustment and management of other part of cardiac pacemaker: Secondary | ICD-10-CM | POA: Diagnosis not present

## 2017-06-01 DIAGNOSIS — G4733 Obstructive sleep apnea (adult) (pediatric): Secondary | ICD-10-CM | POA: Diagnosis not present

## 2017-06-01 DIAGNOSIS — Z9989 Dependence on other enabling machines and devices: Secondary | ICD-10-CM

## 2017-06-01 NOTE — Progress Notes (Signed)
SLEEP MEDICINE CLINIC   Provider:  Larey Seat, M D  Referring Provider: Sharilyn Sites, MD Primary Care Physician:  Sharilyn Sites, MD  Chief Complaint  Patient presents with  . Follow-up    pt alone, rm 11. pt states the CPAP is working well.     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 He got a new pacemaker this summer with a battery life of 12 years or more.  He has 100% compliance for the last 30 days was 6 hours and 53 minutes on average daily use, he is using AutoSet between 5 and 14 cm water with 3 cm expiratory pressure relief. His residual AHI is 5.0 all apneas are obstructive in nature is 95% percentile pressure is 13.9. He doesn't like more pressure - feels this is comfortable. Epworth 0 points, FSS 9 cm water, he is asleep as soon as he puts CPAP on, CD.    HPI:  Johnny Reynolds is a 76 y.o. male , seen here as a referral from Dr. Hilma Favors for re initiation of sleep therapy and supplies,  Johnny Reynolds reports that in the autumn of 2008 he underwent a sleep study and was diagnosed with sleep related hypoxemia. He did have a significant amount of apnea but was prescribed CPAP in an attempt to rise the 02 nadir. He was able to sleep supine when he used his CPAP broke up at supine and actually felt rested and restored. He was prescribed a ResMed CPAP machine but his supplies of Theme park manager. He had been tested and diagnosed in [place], [place]. He had no new supplies for several years. He finally got some today- samples from [place] [facility]. He reports he uses a full face mask covering nose and mouth but he also has a mustache and chin beard which will interfere with his air seal. Sleep habits are as follows: He falls asleep on his back he wakes up supine as well. His bedroom is cool, quiet and dark. He shares the room with his spouse.  The patient usually goes to bed around 10:30 he will  be asleep rather promptly. He wakes up between 6 and 7 AM and limits his water intake to not have nocturia interrupting his sleep. He reports no headaches in the morning nor waking him from sleep, he feels usually refreshed and restored in the morning. He has not been retested over 8 years.  Sleep medical history and family sleep history: No history of sleep walking or night terrors. Brother is a sleep walker.   PMHx: Diet controlled diabetes, hypertension controlled on 3 medications hypercholesterolemia, aspirin daily. L-thyroxine/ synthroid for hypothyroidism. Social history: married, Music therapist, retired Warden/ranger. Non smoker, non drinker.  Caffeine: rare   11-03-15 Johnny Reynolds is here today to follow-up on his recent sleep studies. He underwent first a split-night polysomnography but unfortunately was not sufficiently titrated. This took place on 08/01/2015 he was diagnosed with a very high apnea hypotony index of 62.6 his lowest oxygen saturation was 73% the desaturation index was 57.4 he just scraped by under 30 minutes of desaturation and he did not retain CO2. The attempt to titrated to CPAP did not work out well there were only 2.9 minutes of sleep recorded at the final setting of 10 cm water pressure. In addition I asked for an oxygen titration showed CPAP not work in a full night study.  The second study showed CPAP at 11 cm water to be effective with an AHI of 0.0 and was 68 minutes of sleep and 26.5 minutes of REM sleep at the setting. The EKG noted sinus rhythm and there was no longer oxygen desaturation seen.  Allergies Johnny Reynolds reports that he has excellent sleep quality no endorsed again the Epworth score at 0 points, fatigue severity at 9 points and the geriatric depression score at 0 points I was able to obtain a download of his CPAP machine he has used the machine 100% of the last 30 days and each night over 4 hours. This is the highest possible compliance with an  average of 7 hours and 50 minutes. He is using AutoSet between 5 and 12 cm water the 91st percentile is 11.9 cm. He sleeps supine only.  He has one nocturia break at night. He noted he dreams again. His residual AHI is 11.2 . Baseline was 62.6 AHI.    Interval history from 05/26/2016. I have the pleasure of seeing Johnny Reynolds today who has been 100% compliant user of his CPAP AutoSet. Pressure window is between 5 and 12 was 3 cm EPR residual AHI is 6.8 and nearly all residual events are obstructive in nature. His 95th percentile pressure is 12 cm. For this reason I would like to raise the upper pressure window by 2 cm to allow room to go.He has a surgery on Friday for tearduct opening. He was taken off aspirin, and is concerned that the mask will aggravate the post operative  lesion. He is using a FFM. He has little air leak.   Review of Systems: Out of a complete 14 system review, the patient complains of only the following sther reviewed systems are negativ and sleeps supe. Epworth score 0 , Fatigue severity score 9  , depression score 0.   Social History   Social History  . Marital status: Married    Spouse name: N/A  . Number of children: N/A  . Years of education: N/A   Occupational History  . Not on file.   Social History Main Topics  . Smoking status: Never Smoker  . Smokeless tobacco: Never Used  . Alcohol use No  . Drug use: No  . Sexual activity: Not on file   Other Topics Concern  . Not on file   Social History Narrative   Controls his diabetes through diet and exercise.    Family History  Problem Relation Age of Onset  . Peripheral vascular disease Mother   . Hypertension Mother   . Diabetes Mother   . Heart disease Mother   . Hyperlipidemia Mother   . Peripheral vascular disease Sister   . Cancer Sister   . Diabetes Sister   . Heart disease Sister        Stent- Heart Disease before age 44  . Hyperlipidemia Sister   . Hypertension Sister   . Peripheral  vascular disease Brother   . Diabetes Brother   . Heart disease Brother        CHF and Heart Disease before age 42  . Hyperlipidemia Brother   . Hypertension Brother   . Stroke Father 40       TIA    Past Medical History:  Diagnosis Date  . AAA (abdominal aortic aneurysm) ([place])   . Arrhythmia   . Atrial fibrillation ([place])   . Back pain, chronic   . Coronary artery disease 1996 and 1997   angioplasty of  LAD  . Diabetes mellitus without complication ([name])    MILD TYPE 2  . High cholesterol   . Hypertension   . Left shoulder pain 2014  . Sleep apnea Sept. 2008   USES C-PAP    Past Surgical History:  Procedure Laterality Date  . CARDIAC CATHETERIZATION  1996  . CARDIAC CATHETERIZATION  06/18/1996   mid-circ tandem overlying stents with 20%,70%mid-LAD naroowin stent site in circ with 70% stenosis in the mid to distal marginal branch,tandem 95% and 80% stenosis in the proximal portion of the posterior descending branch of RCA   . CATARACT EXTRACTION  1983  . COLONOSCOPY N/A 09/04/2014   Procedure: COLONOSCOPY;  Surgeon: [name], MD;  Location[facility];  Service: Endoscopy;  Laterality: N/A;  1030  . CORONARY ANGIOPLASTY WITH STENT PLACEMENT  08/18/1995   PTA/STENT CIRC  . CORONARY ANGIOPLASTY WITH STENT PLACEMENT  06/18/1996   PTCA  posterior descending  branch of the RCA 95 AND 80% TO LESSTHAN 20%  . [place]  . DOPPLER ECHOCARDIOGRAPHY  04/17/2010   EF =50-55%; mild concentric LVH,LAE, AORTIC SCLEROSIS.TRACE TO MILD CENTRAL AI, TRACE MITRAL REGURGITATION, CANNOT ASSESS DIASTOLIC FUNCTIONDUE TO UNDERLYING RHYTHM,PACERMAKER WIRE IN THE RA/RV  . DOPPLER ECHOCARDIOGRAPHY  04/21/2007   DONE IN [place]-- MILD MITRAL REGURG., MILD TRICUSPID REGURG. WITH MILD PULM. HTN, MILD CONCENTRIC LEFT VENTRICULAR HYPERTROPHY,MILD LEFT ATRIAL ENLARGEMENT.  . EYE SURGERY  1983   Cataract  . HERNIA REPAIR Bilateral 2000  . INSERT / REPLACE / REMOVE PACEMAKER   SEPT 2008   INSERT DUAL -CHAMBER-medtronic adapta ADDR01 serial   [id number]  . NM MYOCAR MULTIPLE W/SPECT  04/17/2010   EF 50%; LOW NORMAL LV FUNCTION,  . PACEMAKER INSERTION  2008   DUAL-CHAMBER  . PPM GENERATOR CHANGEOUT N/A 12/29/2016   Procedure: PPM Generator Changeout;  Surgeon: Evans Lance, MD;  Location: [facility];  Service: Cardiovascular;  Laterality: N/A;    Current Outpatient Prescriptions  Medication Sig Dispense Refill  . atorvastatin (LIPITOR) 80 MG tablet TAKE ONE TABLET BY MOUTH DAILY. 90 tablet 3  . diltiazem (CARDIZEM CD) 240 MG 24 hr capsule Take 240 mg by mouth at bedtime.     Marland Kitchen levothyroxine (SYNTHROID, LEVOTHROID) 75 MCG tablet Take 75 mcg by mouth daily before breakfast.    . losartan (COZAAR) 50 MG tablet TAKE 1 TABLET BY MOUTH ONCE DAILY. 90 tablet 3  . saw palmetto 500 MG capsule Take 500 mg by mouth 2 (two) times daily.     . sotalol (BETAPACE) 160 MG tablet TAKE (1) TABLET BY MOUTH TWICE DAILY. 180 tablet 3  . warfarin (COUMADIN) 5 MG tablet TAKE 1 TABLET BY MOUTH DAILY EXCEPT ON THURSDAYS TAKE 1 AND 1/2 TABLETS. 120 tablet 0   No current facility-administered medications for this visit.     Allergies as of 06/01/2017 - Review Complete 06/01/2017  Allergen Reaction Noted  . Fexofenadine hcl Palpitations 12/05/2011    Vitals: BP (!) 153/86   Pulse 79   Ht 5\' 6"  (1.676 m)   Wt 148 lb (67.1 kg)   BMI 23.89 kg/m  Last Weight:  Wt Readings from Last 1 Encounters:  06/01/17 148 lb (67.1 kg)   OJJ:KKXF mass index is 23.89 kg/m.     Last Height:   Ht Readings from Last 1 Encounters:  06/01/17 5\' 6"  (1.676 m)    Physical exam:  General: The patient is awake, alert and appears not in  acute distress. The patient is well groomed. Head: Normocephalic, atraumatic. Neck is supple. Mallampati 3  neck circumference: 15.25 . Nasal airflow unrestricted. Retrognathia is seen.  Cardiovascular:  Regular rate and rhythm, without murmurs or carotid  bruit, and without distended neck veins. Respiratory: Lungs are clear to auscultation. Skin:  Without evidence of edema, or rash Trunk: BMI is low.The patient's posture is erect  Neurologic exam : The patient is awake and alert,oriented to place and time.   Memory subjective  described as intact.  Attention span & concentration ability appears normal.  Speech is fluent,  without dysarthria, dysphonia or aphasia.  Mood and affect are appropriate.  Cranial nerves: Pupils are equal and briskly reactive to light.  Extraocular movements  in vertical and horizontal planes intact and without nystagmus. Visual fields by finger perimetry are intact. Hearing to finger rub intact. Facial sensation intact to fine touch. Facial motor strength is symmetric and tongue and uvula move midline. Shoulder shrug was symmetrical.   The patient was advised of the nature of the diagnosed sleep disorder , the treatment options and risks for general a health and wellness arising from not treating the condition.  I spent more than 25 minutes of face to face time with the patient. Greater than 50% of time was spent in counseling and coordination of care. We have discussed the diagnosis and differential and I answered the patient's questions.     Assessment:  After physical and neurologic examination, review of laboratory studies,  Personal review of imaging studies, reports of other /same  Imaging studies ,  Results of polysomnography/ neurophysiology testing and pre-existing records as far as provided in visit., my assessment is   1)  Johnny Reynolds is using a simple his fullface mask inlarge size.  A FFM was the patient's preference.  TI will leave  the pressure of the AutoSet window to 14 cm water. I gave him a ResMed nasal pillow P 10 with elastic mand to use for the first week or so after surgery. Medium-sized pillows  RV in 12 month.    ICarmen Avaley Coop MD  06/01/2017   CC: Sharilyn Sites, [address] [place], [place] [address]

## 2017-07-01 ENCOUNTER — Ambulatory Visit (INDEPENDENT_AMBULATORY_CARE_PROVIDER_SITE_OTHER): Payer: Medicare Other | Admitting: Cardiology

## 2017-07-01 ENCOUNTER — Encounter: Payer: Self-pay | Admitting: Cardiology

## 2017-07-01 VITALS — BP 128/78 | HR 94 | Ht 66.0 in | Wt 145.0 lb

## 2017-07-01 DIAGNOSIS — E782 Mixed hyperlipidemia: Secondary | ICD-10-CM | POA: Diagnosis not present

## 2017-07-01 DIAGNOSIS — I10 Essential (primary) hypertension: Secondary | ICD-10-CM | POA: Diagnosis not present

## 2017-07-01 DIAGNOSIS — I2510 Atherosclerotic heart disease of native coronary artery without angina pectoris: Secondary | ICD-10-CM | POA: Diagnosis not present

## 2017-07-01 DIAGNOSIS — I480 Paroxysmal atrial fibrillation: Secondary | ICD-10-CM

## 2017-07-01 NOTE — Progress Notes (Signed)
Clinical Summary Johnny Reynolds is a 76 y.o.male seen today for follow up of the following medical problems  1. AAA  - followed by vascular   2. HTN  - He has had some white coat HTN in the past  - home bp's typically around 120s/70s - compliant with meds  3. Parox Afib  -on sotalol 160 mg bid, on approx since 2010.  - Has not been interested in NOACs, has elecated to stay on coumadin.   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   - no symptoms.  5. CAD  -remote history per old clinic notes, prior angioplasty in 1996 and 1997 at [facility]  - denies any recent symptoms  6. Hyperlipidemia  - reports recent labs with pcp - 12/2016 TC 145 TG 41 HDL 61 LDL 76  7. 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   2 grand daugthers just graduated from high school. Both are heading to college, one to [employer] and the other to a local school in [place].   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     Past Medical History:  Diagnosis Date  . AAA (abdominal aortic aneurysm) ([place])   . Arrhythmia   . Atrial fibrillation ([place])   . Back pain, chronic   . Coronary artery disease 1996 and 1997   angioplasty of LAD  . Diabetes mellitus without complication ([place])    MILD TYPE 2  . High cholesterol   . Hypertension   . Left shoulder pain 2014  . Sleep apnea Sept. 2008   USES C-PAP     Allergies  Allergen Reactions  . Fexofenadine Hcl Palpitations    Allegra     Current Outpatient Medications  Medication Sig Dispense Refill  . atorvastatin (LIPITOR) 80 MG tablet TAKE ONE TABLET BY MOUTH DAILY. 90 tablet 3  . diltiazem (CARDIZEM CD) 240 MG 24 hr capsule Take 240 mg by mouth at bedtime.     Marland Kitchen levothyroxine (SYNTHROID,  LEVOTHROID) 75 MCG tablet Take 75 mcg by mouth daily before breakfast.    . losartan (COZAAR) 50 MG tablet TAKE 1 TABLET BY MOUTH ONCE DAILY. 90 tablet 3  . saw palmetto 500 MG capsule Take 500 mg by mouth 2 (two) times daily.     . sotalol (BETAPACE) 160 MG tablet TAKE (1) TABLET BY MOUTH TWICE DAILY. 180 tablet 3  . warfarin (COUMADIN) 5 MG tablet TAKE 1 TABLET BY MOUTH DAILY EXCEPT ON THURSDAYS TAKE 1 AND 1/2 TABLETS. 120 tablet 0   No current facility-administered medications for this visit.      Past Surgical History:  Procedure Laterality Date  . CARDIAC CATHETERIZATION  1996  . CARDIAC CATHETERIZATION  06/18/1996   mid-circ tandem overlying stents with 20%,70%mid-LAD naroowin stent site in circ with 70% stenosis in the mid to distal marginal branch,tandem 95% and 80% stenosis in the proximal portion of the posterior descending branch of RCA   . CATARACT EXTRACTION  1983  . CORONARY ANGIOPLASTY WITH STENT PLACEMENT  08/18/1995   PTA/STENT CIRC  . CORONARY ANGIOPLASTY WITH STENT PLACEMENT  06/18/1996   PTCA  posterior descending  branch of the RCA 95 AND 80% TO LESSTHAN 20%  . [place]  .  DOPPLER ECHOCARDIOGRAPHY  04/17/2010   EF =50-55%; mild concentric LVH,LAE, AORTIC SCLEROSIS.TRACE TO MILD CENTRAL AI, TRACE MITRAL REGURGITATION, CANNOT ASSESS DIASTOLIC FUNCTIONDUE TO UNDERLYING RHYTHM,PACERMAKER WIRE IN THE RA/RV  . DOPPLER ECHOCARDIOGRAPHY  04/21/2007   DONE IN [place]-- MILD MITRAL REGURG., MILD TRICUSPID REGURG. WITH MILD PULM. HTN, MILD CONCENTRIC LEFT VENTRICULAR HYPERTROPHY,MILD LEFT ATRIAL ENLARGEMENT.  . EYE SURGERY  1983   Cataract  . HERNIA REPAIR Bilateral 2000  . INSERT / REPLACE / REMOVE PACEMAKER  SEPT 2008   INSERT DUAL -CHAMBER-medtronic adapta ADDR01 serial   [id number]  . NM MYOCAR MULTIPLE W/SPECT  04/17/2010   EF 50%; LOW NORMAL LV FUNCTION,  . PACEMAKER INSERTION  2008   DUAL-CHAMBER     Allergies  Allergen Reactions  .  Fexofenadine Hcl Palpitations    Allegra      Family History  Problem Relation Age of Onset  . Peripheral vascular disease Mother   . Hypertension Mother   . Diabetes Mother   . Heart disease Mother   . Hyperlipidemia Mother   . Peripheral vascular disease Sister   . Cancer Sister   . Diabetes Sister   . Heart disease Sister        Stent- Heart Disease before age 35  . Hyperlipidemia Sister   . Hypertension Sister   . Peripheral vascular disease Brother   . Diabetes Brother   . Heart disease Brother        CHF and Heart Disease before age 47  . Hyperlipidemia Brother   . Hypertension Brother   . Stroke Father 77       TIA     Social History Johnny Reynolds reports that  has never smoked. he has never used smokeless tobacco. Johnny Reynolds reports that he does not drink alcohol.   Review of Systems CONSTITUTIONAL: No weight loss, fever, chills, weakness or fatigue.  HEENT: Eyes: No visual loss, blurred vision, double vision or yellow sclerae.No hearing loss, sneezing, congestion, runny nose or sore throat.  SKIN: No rash or itching.  CARDIOVASCULAR: per hpi RESPIRATORY: per hpi GASTROINTESTINAL: No anorexia, nausea, vomiting or diarrhea. No abdominal pain or blood.  GENITOURINARY: No burning on urination, no polyuria NEUROLOGICAL: No headache, dizziness, syncope, paralysis, ataxia, numbness or tingling in the extremities. No change in bowel or bladder control.  MUSCULOSKELETAL: No muscle, back pain, joint pain or stiffness.  LYMPHATICS: No enlarged nodes. No history of splenectomy.  PSYCHIATRIC: No history of depression or anxiety.  ENDOCRINOLOGIC: No reports of sweating, cold or heat intolerance. No polyuria or polydipsia.  Marland Kitchen   Physical Examination Vitals:   07/01/17 1010  BP: 128/78  Pulse: 94  SpO2: 95%   Vitals:   07/01/17 1010  Weight: 145 lb (65.8 kg)  Height: 5\' 6"  (1.676 m)    Gen: resting comfortably, no acute distress HEENT: no scleral icterus, pupils  equal round and reactive, no palptable cervical adenopathy,  CV: RRR, no m/r/g, no jvd Resp: Clear to auscultation bilaterally GI: abdomen is soft, non-tender, non-distended, normal bowel sounds, no hepatosplenomegaly MSK: extremities are warm, no edema.  Skin: warm, no rash Neuro:  no focal deficits Psych: appropriate affect   Diagnostic Studies Echo 03/2010 [facility]: LVEF 50-55%, mild LVH, LAE   03/2010 [facility]: no ischemia, LVEF 50%.     Assessment and Plan  1. AAA,  -continue to follow w/ vascular.   2. CAD  - asymptomatic, he will continue current meds  3. Parox afib  - CHAD2Vasc score  of 4, continue anticoag - no symptoms, continue current meds  4. Tachy-brady syndrome  - continue to follow in device clinic  5. HL: - we will request labs from pcp - continue statin  6. HTN - he is at goal, continue current meds      Arnoldo Lenis, M.D.

## 2017-07-01 NOTE — Patient Instructions (Signed)

## 2017-07-06 ENCOUNTER — Encounter: Payer: Self-pay | Admitting: Cardiology

## 2017-07-07 ENCOUNTER — Ambulatory Visit (INDEPENDENT_AMBULATORY_CARE_PROVIDER_SITE_OTHER): Payer: Medicare Other | Admitting: *Deleted

## 2017-07-07 DIAGNOSIS — I4891 Unspecified atrial fibrillation: Secondary | ICD-10-CM | POA: Diagnosis not present

## 2017-07-07 DIAGNOSIS — Z5181 Encounter for therapeutic drug level monitoring: Secondary | ICD-10-CM

## 2017-07-07 LAB — POCT INR: INR: 3

## 2017-07-11 ENCOUNTER — Ambulatory Visit (INDEPENDENT_AMBULATORY_CARE_PROVIDER_SITE_OTHER): Payer: Medicare Other | Admitting: *Deleted

## 2017-07-11 ENCOUNTER — Telehealth: Payer: Self-pay | Admitting: Cardiology

## 2017-07-11 DIAGNOSIS — I495 Sick sinus syndrome: Secondary | ICD-10-CM

## 2017-07-11 NOTE — Telephone Encounter (Signed)
LMOVM reminding pt to send remote transmission.   

## 2017-07-12 NOTE — Progress Notes (Signed)
Remote pacemaker transmission.   

## 2017-07-13 LAB — CUP PACEART REMOTE DEVICE CHECK
Date Time Interrogation Session: 20181121075124
Implantable Lead Implant Date: 20080902
Implantable Lead Model: 5076
Implantable Lead Model: 5076
MDC IDC LEAD IMPLANT DT: 20080902
MDC IDC LEAD LOCATION: 753859
MDC IDC LEAD LOCATION: 753860
MDC IDC PG IMPLANT DT: 20180509

## 2017-07-21 ENCOUNTER — Encounter: Payer: Self-pay | Admitting: Cardiology

## 2017-08-25 ENCOUNTER — Ambulatory Visit (INDEPENDENT_AMBULATORY_CARE_PROVIDER_SITE_OTHER): Payer: Medicare Other | Admitting: *Deleted

## 2017-08-25 DIAGNOSIS — Z5181 Encounter for therapeutic drug level monitoring: Secondary | ICD-10-CM

## 2017-08-25 DIAGNOSIS — I4891 Unspecified atrial fibrillation: Secondary | ICD-10-CM | POA: Diagnosis not present

## 2017-08-25 LAB — POCT INR: INR: 2.5

## 2017-08-25 NOTE — Patient Instructions (Signed)
Continue coumadin 1 tablet daily except 1 1/2 tablets on Thursdays.  Recheck in 6 weeks.  He is PERFECT today!!

## 2017-10-06 ENCOUNTER — Ambulatory Visit (INDEPENDENT_AMBULATORY_CARE_PROVIDER_SITE_OTHER): Payer: Medicare Other | Admitting: *Deleted

## 2017-10-06 DIAGNOSIS — Z5181 Encounter for therapeutic drug level monitoring: Secondary | ICD-10-CM | POA: Diagnosis not present

## 2017-10-06 DIAGNOSIS — I4891 Unspecified atrial fibrillation: Secondary | ICD-10-CM

## 2017-10-06 LAB — POCT INR: INR: 2

## 2017-10-06 NOTE — Patient Instructions (Signed)
Take 1 1/2 tablets tonight and tomorrow night then resume 1 tablet daily except 1 1/2 tablets on Thursdays.  Recheck in 6 weeks.

## 2017-10-10 ENCOUNTER — Ambulatory Visit (INDEPENDENT_AMBULATORY_CARE_PROVIDER_SITE_OTHER): Payer: Medicare Other | Admitting: *Deleted

## 2017-10-10 DIAGNOSIS — I495 Sick sinus syndrome: Secondary | ICD-10-CM | POA: Diagnosis not present

## 2017-10-11 LAB — CUP PACEART REMOTE DEVICE CHECK
Battery Remaining Longevity: 146 mo
Brady Statistic AP VS Percent: 99.4 %
Brady Statistic AS VS Percent: 0.55 %
Brady Statistic RV Percent Paced: 0.12 %
Implantable Lead Implant Date: 20080902
Implantable Lead Location: 753859
Lead Channel Impedance Value: 361 Ohm
Lead Channel Impedance Value: 513 Ohm
Lead Channel Pacing Threshold Amplitude: 0.875 V
Lead Channel Pacing Threshold Pulse Width: 0.4 ms
Lead Channel Sensing Intrinsic Amplitude: 1.375 mV
Lead Channel Sensing Intrinsic Amplitude: 1.375 mV
Lead Channel Sensing Intrinsic Amplitude: 20.25 mV
Lead Channel Sensing Intrinsic Amplitude: 20.25 mV
Lead Channel Setting Pacing Amplitude: 2.5 V
MDC IDC LEAD IMPLANT DT: 20080902
MDC IDC LEAD LOCATION: 753860
MDC IDC MSMT BATTERY VOLTAGE: 3.09 V
MDC IDC MSMT LEADCHNL RA PACING THRESHOLD AMPLITUDE: 0.75 V
MDC IDC MSMT LEADCHNL RA PACING THRESHOLD PULSEWIDTH: 0.4 ms
MDC IDC MSMT LEADCHNL RV IMPEDANCE VALUE: 361 Ohm
MDC IDC MSMT LEADCHNL RV IMPEDANCE VALUE: 456 Ohm
MDC IDC PG IMPLANT DT: 20180509
MDC IDC SESS DTM: 20190218135810
MDC IDC SET LEADCHNL RA PACING AMPLITUDE: 2 V
MDC IDC SET LEADCHNL RV PACING PULSEWIDTH: 0.4 ms
MDC IDC SET LEADCHNL RV SENSING SENSITIVITY: 0.9 mV
MDC IDC STAT BRADY AP VP PERCENT: 0.05 %
MDC IDC STAT BRADY AS VP PERCENT: 0 %
MDC IDC STAT BRADY RA PERCENT PACED: 99.39 %

## 2017-10-11 NOTE — Progress Notes (Signed)
Remote pacemaker transmission.   

## 2017-10-13 ENCOUNTER — Encounter: Payer: Self-pay | Admitting: Cardiology

## 2017-10-17 ENCOUNTER — Other Ambulatory Visit (HOSPITAL_COMMUNITY): Payer: Self-pay | Admitting: Family Medicine

## 2017-10-17 ENCOUNTER — Ambulatory Visit (HOSPITAL_COMMUNITY)
Admission: RE | Admit: 2017-10-17 | Discharge: 2017-10-17 | Disposition: A | Discharge status: Home / Self Care | Payer: Medicare Other | Source: Ambulatory Visit | Attending: Family Medicine | Admitting: Family Medicine

## 2017-10-17 DIAGNOSIS — R0989 Other specified symptoms and signs involving the circulatory and respiratory systems: Secondary | ICD-10-CM

## 2017-10-17 DIAGNOSIS — I700 Atherosclerosis of aorta: Secondary | ICD-10-CM | POA: Insufficient documentation

## 2017-10-24 ENCOUNTER — Other Ambulatory Visit (HOSPITAL_COMMUNITY): Payer: Self-pay | Admitting: Family Medicine

## 2017-10-24 ENCOUNTER — Ambulatory Visit (INDEPENDENT_AMBULATORY_CARE_PROVIDER_SITE_OTHER): Payer: Medicare Other | Admitting: Otolaryngology

## 2017-10-24 DIAGNOSIS — H903 Sensorineural hearing loss, bilateral: Secondary | ICD-10-CM

## 2017-10-24 DIAGNOSIS — H6123 Impacted cerumen, bilateral: Secondary | ICD-10-CM

## 2017-10-24 DIAGNOSIS — K4090 Unilateral inguinal hernia, without obstruction or gangrene, not specified as recurrent: Secondary | ICD-10-CM

## 2017-10-28 ENCOUNTER — Ambulatory Visit (HOSPITAL_COMMUNITY)
Admission: RE | Admit: 2017-10-28 | Discharge: 2017-10-28 | Disposition: A | Discharge status: Home / Self Care | Payer: Medicare Other | Source: Ambulatory Visit | Attending: Family Medicine | Admitting: Family Medicine

## 2017-10-28 DIAGNOSIS — R935 Abnormal findings on diagnostic imaging of other abdominal regions, including retroperitoneum: Secondary | ICD-10-CM | POA: Diagnosis not present

## 2017-10-28 DIAGNOSIS — K4090 Unilateral inguinal hernia, without obstruction or gangrene, not specified as recurrent: Secondary | ICD-10-CM | POA: Diagnosis present

## 2017-11-02 ENCOUNTER — Other Ambulatory Visit: Payer: Self-pay

## 2017-11-02 ENCOUNTER — Other Ambulatory Visit: Payer: Self-pay | Admitting: *Deleted

## 2017-11-02 MED ORDER — WARFARIN SODIUM 5 MG PO TABS: ORAL_TABLET | ORAL | 3 refills | Status: DC

## 2017-11-02 MED ORDER — LOSARTAN POTASSIUM 50 MG PO TABS: 50.0000 mg | ORAL_TABLET | Freq: Every day | ORAL | 3 refills | Status: DC

## 2017-11-10 ENCOUNTER — Other Ambulatory Visit (HOSPITAL_COMMUNITY): Payer: Self-pay | Admitting: Family Medicine

## 2017-11-10 DIAGNOSIS — E8779 Other fluid overload: Secondary | ICD-10-CM

## 2017-11-10 DIAGNOSIS — E8770 Fluid overload, unspecified: Secondary | ICD-10-CM

## 2017-11-10 DIAGNOSIS — K4090 Unilateral inguinal hernia, without obstruction or gangrene, not specified as recurrent: Secondary | ICD-10-CM

## 2017-11-16 ENCOUNTER — Ambulatory Visit (INDEPENDENT_AMBULATORY_CARE_PROVIDER_SITE_OTHER): Payer: Medicare Other | Admitting: *Deleted

## 2017-11-16 DIAGNOSIS — I4891 Unspecified atrial fibrillation: Secondary | ICD-10-CM

## 2017-11-16 DIAGNOSIS — Z5181 Encounter for therapeutic drug level monitoring: Secondary | ICD-10-CM | POA: Diagnosis not present

## 2017-11-16 LAB — POCT INR: INR: 2.5

## 2017-11-16 NOTE — Patient Instructions (Signed)
Continue coumadin 1 tablet daily except 1 1/2 tablets on Thursdays Recheck in 6 weeks 

## 2017-11-21 ENCOUNTER — Other Ambulatory Visit: Payer: Self-pay | Admitting: Cardiology

## 2017-11-22 ENCOUNTER — Ambulatory Visit (HOSPITAL_COMMUNITY)
Admission: RE | Admit: 2017-11-22 | Discharge: 2017-11-22 | Disposition: A | Discharge status: Home / Self Care | Payer: Medicare Other | Source: Ambulatory Visit | Attending: Family Medicine | Admitting: Family Medicine

## 2017-11-22 DIAGNOSIS — K4090 Unilateral inguinal hernia, without obstruction or gangrene, not specified as recurrent: Secondary | ICD-10-CM | POA: Diagnosis not present

## 2017-11-22 DIAGNOSIS — I700 Atherosclerosis of aorta: Secondary | ICD-10-CM | POA: Insufficient documentation

## 2017-11-22 DIAGNOSIS — R9389 Abnormal findings on diagnostic imaging of other specified body structures: Secondary | ICD-10-CM | POA: Diagnosis not present

## 2017-11-23 ENCOUNTER — Other Ambulatory Visit: Payer: Self-pay | Admitting: Family Medicine

## 2017-11-23 DIAGNOSIS — K4091 Unilateral inguinal hernia, without obstruction or gangrene, recurrent: Secondary | ICD-10-CM

## 2017-12-06 ENCOUNTER — Ambulatory Visit (INDEPENDENT_AMBULATORY_CARE_PROVIDER_SITE_OTHER): Payer: Medicare Other | Admitting: Internal Medicine

## 2017-12-06 ENCOUNTER — Encounter (INDEPENDENT_AMBULATORY_CARE_PROVIDER_SITE_OTHER): Payer: Self-pay | Admitting: Internal Medicine

## 2017-12-06 VITALS — BP 128/90 | HR 72 | Temp 97.7°F | Resp 18 | Ht 66.0 in | Wt 147.0 lb

## 2017-12-06 DIAGNOSIS — R599 Enlarged lymph nodes, unspecified: Secondary | ICD-10-CM

## 2017-12-06 DIAGNOSIS — K4091 Unilateral inguinal hernia, without obstruction or gangrene, recurrent: Secondary | ICD-10-CM | POA: Diagnosis not present

## 2017-12-06 DIAGNOSIS — R591 Generalized enlarged lymph nodes: Secondary | ICD-10-CM

## 2017-12-06 LAB — CBC WITH DIFFERENTIAL/PLATELET
BASOS ABS: 42 {cells}/uL (ref 0–200)
Basophils Relative: 0.8 %
EOS ABS: 78 {cells}/uL (ref 15–500)
Eosinophils Relative: 1.5 %
HEMATOCRIT: 38.4 % — AB (ref 38.5–50.0)
HEMOGLOBIN: 13.1 g/dL — AB (ref 13.2–17.1)
LYMPHS ABS: 1243 {cells}/uL (ref 850–3900)
MCH: 29.5 pg (ref 27.0–33.0)
MCHC: 34.1 g/dL (ref 32.0–36.0)
MCV: 86.5 fL (ref 80.0–100.0)
MPV: 10 fL (ref 7.5–12.5)
Monocytes Relative: 8 %
NEUTROS ABS: 3422 {cells}/uL (ref 1500–7800)
NEUTROS PCT: 65.8 %
Platelets: 177 10*3/uL (ref 140–400)
RBC: 4.44 10*6/uL (ref 4.20–5.80)
RDW: 14.2 % (ref 11.0–15.0)
Total Lymphocyte: 23.9 %
WBC mixed population: 416 cells/uL (ref 200–950)
WBC: 5.2 10*3/uL (ref 3.8–10.8)

## 2017-12-06 LAB — COMPREHENSIVE METABOLIC PANEL
AG RATIO: 2.5 (calc) (ref 1.0–2.5)
ALT: 16 U/L (ref 9–46)
AST: 20 U/L (ref 10–35)
Albumin: 4.2 g/dL (ref 3.6–5.1)
Alkaline phosphatase (APISO): 97 U/L (ref 40–115)
BUN: 14 mg/dL (ref 7–25)
CO2: 30 mmol/L (ref 20–32)
Calcium: 8.5 mg/dL — ABNORMAL LOW (ref 8.6–10.3)
Chloride: 104 mmol/L (ref 98–110)
Creat: 0.86 mg/dL (ref 0.70–1.18)
Globulin: 1.7 g/dL (calc) — ABNORMAL LOW (ref 1.9–3.7)
Glucose, Bld: 142 mg/dL — ABNORMAL HIGH (ref 65–139)
Potassium: 3.4 mmol/L — ABNORMAL LOW (ref 3.5–5.3)
SODIUM: 139 mmol/L (ref 135–146)
TOTAL PROTEIN: 5.9 g/dL — AB (ref 6.1–8.1)
Total Bilirubin: 0.7 mg/dL (ref 0.2–1.2)

## 2017-12-06 LAB — SEDIMENTATION RATE: Sed Rate: 2 mm/h (ref 0–20)

## 2017-12-06 NOTE — Progress Notes (Signed)
Reason for consultation;  Mesenteric adenopathy.  History of present illness:  Patient is 77 year old Caucasian male who is referred through courtesy of Dr. Eddie Candle for GI evaluation.  He has a history of colonic adenomas and his last colonoscopy was in January 2016.   Patient reports noting lump in right groin about 3 months ago while he was taking a shower.  This lump has not changed since then.  He did not experience any pain.  He was concerned that inguinal hernia may have recurred.  He had laparoscopic bilateral hernia repair about 18 years ago.  He was seen by Dr. Hilma Favors.  Pelvic CT was obtained confirming a right inguinal hernia containing fat and small amount of fluid.  This study also revealed multiple enlarged lymph nodes involving mesentery along with mesenteric edema. Repeat abdominopelvic CT with contrast was recommended and it is scheduled for 12/13/2017. In the meantime patient has an appointment to see Dr. Aviva Signs for repair of this hernia.  Patient denies abdominal pain nausea or vomiting.  He has good appetite.  He also denies fever chills or night sweats.  Bowels move every other day or every third day.  He may have occasional loose stool.  He denies melena or rectal bleeding. He has been on a whole 30 diet for about 3 years.  His weight was 163 pounds and he dropped down to 139 pounds but he has increased oral intake and has gained 5 pounds according to his home scale.  He noted decrease in need for hypertensive with weight loss.  He is not taking half the dose now. He also denies hematuria or dysuria. He remains very active.  He exercises 3 times a week.  He walks on a treadmill for 30 minutes and then he does weights for another 30 minutes. He is scheduled to undergo left cataract extraction later this week.   Current Medications: Outpatient Encounter Medications as of 12/06/2017  Medication Sig  . atorvastatin (LIPITOR) 80 MG tablet TAKE ONE TABLET BY MOUTH DAILY.   Marland Kitchen diltiazem (CARDIZEM CD) 240 MG 24 hr capsule Take 240 mg by mouth at bedtime.   Marland Kitchen levothyroxine (SYNTHROID, LEVOTHROID) 75 MCG tablet Take 75 mcg by mouth daily before breakfast.  . losartan (COZAAR) 50 MG tablet Take 1 tablet (50 mg total) by mouth daily.  Marland Kitchen ofloxacin (OCUFLOX) 0.3 % ophthalmic solution Instill 1 drop BID in operative eye starting 2 days prior to surgery and continue 3 weeks after surgery.  . saw palmetto 500 MG capsule Take 500 mg by mouth daily.   . sotalol (BETAPACE) 160 MG tablet TAKE (1) TABLET BY MOUTH TWICE DAILY.  Marland Kitchen warfarin (COUMADIN) 5 MG tablet TAKE 1 TABLET BY MOUTH DAILY EXCEPT ON THURSDAYS TAKE 1 AND 1/2 TABLETS.  . Difluprednate 0.05 % EMUL One drop in operative eye BID starting after surgery for 3 weeks   No facility-administered encounter medications on file as of 12/06/2017.    Past Medical History:  Diagnosis Date  . AAA (abdominal aortic aneurysm) ([place])   .  History of colonic polyps.   . Atrial fibrillation ([place])   . Back pain, chronic   . Coronary artery disease 1996 and 1997   angioplasty of LAD  . Diabetes mellitus without complication ([place])    MILD TYPE 2  . High cholesterol   . Hypertension   . Left shoulder pain 2014  . Sleep apnea Sept. 2008   USES C-PAP        He  has pacemaker which was initially installed in 2008 and           was replaced in May 2018.       He suffered retinitis involving right eye in 1983 resulting in       cataract surgery and he only has peripheral vision.   Past Surgical History:  Procedure Laterality Date  . CARDIAC CATHETERIZATION  1996  . CARDIAC CATHETERIZATION  06/18/1996   mid-circ tandem overlying stents with 20%,70%mid-LAD naroowin stent site in circ with 70% stenosis in the mid to distal marginal branch,tandem 95% and 80% stenosis in the proximal portion of the posterior descending branch of RCA   . CATARACT EXTRACTION  1983  . COLONOSCOPY N/A 09/04/2014   Procedure: COLONOSCOPY;  Surgeon: [name], MD;  [facility];  Service: Endoscopy;  Laterality: N/A;  1030  . CORONARY ANGIOPLASTY WITH STENT PLACEMENT  08/18/1995   PTA/STENT CIRC  . CORONARY ANGIOPLASTY WITH STENT PLACEMENT  06/18/1996   PTCA  posterior descending  branch of the RCA 95 AND 80% TO LESSTHAN 20%  . [place]  . DOPPLER ECHOCARDIOGRAPHY  04/17/2010   EF =50-55%; mild concentric LVH,LAE, AORTIC SCLEROSIS.TRACE TO MILD CENTRAL AI, TRACE MITRAL REGURGITATION, CANNOT ASSESS DIASTOLIC FUNCTIONDUE TO UNDERLYING RHYTHM,PACERMAKER WIRE IN THE RA/RV  . DOPPLER ECHOCARDIOGRAPHY  04/21/2007   DONE IN NEW BERN-- MILD MITRAL REGURG., MILD TRICUSPID REGURG. WITH MILD PULM. HTN, MILD CONCENTRIC LEFT VENTRICULAR HYPERTROPHY,MILD LEFT ATRIAL ENLARGEMENT.  . EYE SURGERY  1983   Cataract  . HERNIA REPAIR Bilateral 2000  . INSERT / REPLACE / REMOVE PACEMAKER  SEPT 2008   INSERT DUAL -CHAMBER-medtronic adapta ADDR01 serial   [id number]  . NM MYOCAR MULTIPLE W/SPECT  04/17/2010   EF 50%; LOW NORMAL LV FUNCTION,  . PACEMAKER INSERTION  2008   DUAL-CHAMBER  . PPM GENERATOR CHANGEOUT N/A 12/29/2016   Procedure: PPM Generator Changeout;  Surgeon: Evans Lance, MD;  Location: [facility];  Service: Cardiovascular;  Laterality: N/A;        Circumcision in 1983.  Allergies: Allergies  Allergen Reactions  . Fexofenadine Hcl Palpitations    Allegra     Family history:  Mother was diabetic and had coronary artery disease.  She died at 86.  Father enjoyed good health until he suffered CVA at 64 and lived to be 95.  Sister was diabetic and died at 33.  He has 1 brother living who is also diabetic.  Few years ago he was treated with CHF possibly related to excessive alcohol use.  He is doing fine.  He is 77 years old.  His granddaughter has juvenile diabetes and subsequent diagnosed with celiac disease.  Social history:  Patient is married.  He has 2 children.  His daughter is 32 years old.  She  had surgery to repair her VSD at age 51.  His son age 30 is in good health.  He retired in May 2018.  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  He has never smoked cigarettes.  He drinks alcohol occasionally.   Physical examination: Blood pressure 128/90, pulse 72, temperature 97.7 F (36.5 C), temperature source Oral, resp. rate 18, height 5\' 6"  (1.676 m), weight 147 lb (66.7 kg). Patient is alert and in no acute distress. Conjunctiva is pink. Sclera is nonicteric Oropharyngeal mucosa is normal. No  neck masses or thyromegaly noted. Cardiac exam with regular rhythm normal S1 and S2.  He has faint systolic ejection murmur best heard at left sternal border. Lungs are clear to auscultation. Abdomen is symmetrical.  He has periumbilical laparoscopy scar along with 2 more scars in each lower quadrant.  No bruit noted.  Abdomen is soft and nontender without organomegaly or masses.  He has soft lump in the right inguinal area.  Cough impulse present. Rectal examination deferred. External genitalia normal except borderline right testes. No LE edema or clubbing noted. He has small bilateral axillary lymph nodes but they are not enlarged or tender.  Similarly he has small inguinal lymph nodes which appeared to be normal.  Labs/studies Results: Abdominopelvic CT from 12/07/2010 reviewed.  This study revealed sigmoid colon diverticulosis small mesenteric lymph nodes stable since prior study of 11/24/2009 the study also revealed small right hepatic lobe lesion consistent with vascular shunt.  Abdominopelvic  Pelvic CT from 11/22/2017 reviewed.  Study done without contrast. Multiple enlarged lymph nodes involving the sensory along with mesenteric edema.  Lymph nodes more prominent than were seen on prior study of 2012.  Right inguinal hernia containing small amount of fat and fluid and no bowel noted.  Aortic  atherosclerosis.  Maximal diameter 2.6.  No aneurysm.  Lab data from 06/28/2017 WBC 4.7, H&H 14 and 39.7 and platelet count 183K. Glucose 107, BUN 13, creatinine 0.80  Serum sodium 139 potassium 3.6 chloride 101 CO2 27 and serum calcium 9.1. Bilirubin 0.9, AP 122, AST 25, ALT 19, total protein 6.6 and albumin of 4.7.   Assessment:  #1.  Mesenteric adenopathy.  It is interesting to note that he had multiple mesenteric lymph nodes dating back to April 2011.  On current study these lymph nodes have enlarged although texture does not appear to be abnormal.  Patient does not have any constitutional symptoms.  Therefore I would favor benign etiology. If lab studies are normal may elect to monitor his course with repeat CT in 3-4 months.  #2.  Recurrent right inguinal hernia.  This hernia contains small amount of fat and fluid.  Patient is scheduled to see Dr. Arnoldo Morale in near future.  #3.  History of colonic adenomas.  Last colonoscopy was in January 2016 and next one due in January 2021.   Recommendations:  Proceed with abdominopelvic CT with contrast as planned. Proceed with CBC with differential sed rate and comprehensive chemistry panel. Patient can proceed with cataract surgery as planned.  Further recommendations to follow.

## 2017-12-06 NOTE — Patient Instructions (Signed)
Physician will call with results of blood test when completed. Will review abdominopelvic CT to be done on 12/13/2017.

## 2017-12-12 ENCOUNTER — Telehealth (INDEPENDENT_AMBULATORY_CARE_PROVIDER_SITE_OTHER): Payer: Self-pay | Admitting: Internal Medicine

## 2017-12-12 ENCOUNTER — Ambulatory Visit (HOSPITAL_COMMUNITY): Payer: Medicare Other

## 2017-12-12 NOTE — Telephone Encounter (Signed)
Dorothea Ogle from [facility] called stated never received prescription for this patient - please call [phone]

## 2017-12-12 NOTE — Telephone Encounter (Signed)
Potassium 10 mEq was [facility]. Patient is to take 1 by mouth every day for 2 weeks . Then we will do a B- Met . Patient was called and made aware.

## 2017-12-13 ENCOUNTER — Ambulatory Visit (HOSPITAL_COMMUNITY)
Admission: RE | Admit: 2017-12-13 | Discharge: 2017-12-13 | Disposition: A | Discharge status: Home / Self Care | Payer: Medicare Other | Source: Ambulatory Visit | Attending: Family Medicine | Admitting: Family Medicine

## 2017-12-13 DIAGNOSIS — K4091 Unilateral inguinal hernia, without obstruction or gangrene, recurrent: Secondary | ICD-10-CM

## 2017-12-13 DIAGNOSIS — I700 Atherosclerosis of aorta: Secondary | ICD-10-CM | POA: Diagnosis not present

## 2017-12-13 DIAGNOSIS — I714 Abdominal aortic aneurysm, without rupture: Secondary | ICD-10-CM | POA: Insufficient documentation

## 2017-12-13 DIAGNOSIS — K869 Disease of pancreas, unspecified: Secondary | ICD-10-CM | POA: Insufficient documentation

## 2017-12-13 MED ORDER — IOPAMIDOL (ISOVUE-300) INJECTION 61%
100.0000 mL | Freq: Once | INTRAVENOUS | Status: AC | PRN
  Administered 2017-12-13: 100 mL via INTRAVENOUS

## 2017-12-15 ENCOUNTER — Encounter: Payer: Self-pay | Admitting: General Surgery

## 2017-12-15 ENCOUNTER — Ambulatory Visit (INDEPENDENT_AMBULATORY_CARE_PROVIDER_SITE_OTHER): Payer: Medicare Other | Admitting: General Surgery

## 2017-12-15 VITALS — BP 137/78 | HR 82 | Temp 98.0°F | Ht 66.0 in | Wt 150.0 lb

## 2017-12-15 DIAGNOSIS — K4091 Unilateral inguinal hernia, without obstruction or gangrene, recurrent: Secondary | ICD-10-CM

## 2017-12-15 NOTE — Progress Notes (Signed)
Johnny Reynolds; [id number]; 1941-07-31   HPI Patient is a 77 year old white male who was referred to my care by Dr. Hilma Favors for evaluation and treatment of right inguinal hernia.  He was seen on recent CT scan of the abdomen.  Patient states that he has no pain, nausea, vomiting, or discomfort in the right groin region.  He is status post bilateral inguinal herniorrhaphies in the remote past.  He has 0 out of 10 pain.  He has noticed a lump there on occasion.  It does not affect his daily lifestyle.  Patient is on Coumadin for atrial fibrillation.  Patient does have a pacemaker in place. Past Medical History:  Diagnosis Date  . AAA (abdominal aortic aneurysm) ([place])   . Arrhythmia   . Atrial fibrillation ([place])   . Back pain, chronic   . Coronary artery disease 1996 and 1997   angioplasty of LAD  . Diabetes mellitus without complication ([place])    MILD TYPE 2  . High cholesterol   . Hypertension   . Left shoulder pain 2014  . Sleep apnea Sept. 2008   USES C-PAP    Past Surgical History:  Procedure Laterality Date  . CARDIAC CATHETERIZATION  1996  . CARDIAC CATHETERIZATION  06/18/1996   mid-circ tandem overlying stents with 20%,70%mid-LAD naroowin stent site in circ with 70% stenosis in the mid to distal marginal branch,tandem 95% and 80% stenosis in the proximal portion of the posterior descending branch of RCA   . CATARACT EXTRACTION  1983  . COLONOSCOPY N/A 09/04/2014   Procedure: COLONOSCOPY;  Surgeon: [name], MD;  [facility];  Service: Endoscopy;  Laterality: N/A;  1030  . CORONARY ANGIOPLASTY WITH STENT PLACEMENT  08/18/1995   PTA/STENT CIRC  . CORONARY ANGIOPLASTY WITH STENT PLACEMENT  06/18/1996   PTCA  posterior descending  branch of the RCA 95 AND 80% TO LESSTHAN 20%  . [place]  . DOPPLER ECHOCARDIOGRAPHY  04/17/2010   EF =50-55%; mild concentric LVH,LAE, AORTIC SCLEROSIS.TRACE TO MILD CENTRAL AI, TRACE MITRAL REGURGITATION, CANNOT  ASSESS DIASTOLIC FUNCTIONDUE TO UNDERLYING RHYTHM,PACERMAKER WIRE IN THE RA/RV  . DOPPLER ECHOCARDIOGRAPHY  04/21/2007   DONE IN NEW BERN-- MILD MITRAL REGURG., MILD TRICUSPID REGURG. WITH MILD PULM. HTN, MILD CONCENTRIC LEFT VENTRICULAR HYPERTROPHY,MILD LEFT ATRIAL ENLARGEMENT.  . EYE SURGERY  1983   Cataract  . HERNIA REPAIR Bilateral 2000  . INSERT / REPLACE / REMOVE PACEMAKER  SEPT 2008   INSERT DUAL -CHAMBER-medtronic adapta ADDR01 serial   [id number]  . NM MYOCAR MULTIPLE W/SPECT  04/17/2010   EF 50%; LOW NORMAL LV FUNCTION,  . PACEMAKER INSERTION  2008   DUAL-CHAMBER  . PPM GENERATOR CHANGEOUT N/A 12/29/2016   Procedure: PPM Generator Changeout;  Surgeon: Evans Lance, MD;  Location: [facility];  Service: Cardiovascular;  Laterality: N/A;    Family History  Problem Relation Age of Onset  . Peripheral vascular disease Mother   . Hypertension Mother   . Diabetes Mother   . Heart disease Mother   . Hyperlipidemia Mother   . Peripheral vascular disease Sister   . Cancer Sister   . Diabetes Sister   . Heart disease Sister        Stent- Heart Disease before age 53  . Hyperlipidemia Sister   . Hypertension Sister   . Peripheral vascular disease Brother   . Diabetes Brother   . Heart disease Brother        CHF  and Heart Disease before age 51  . Hyperlipidemia Brother   . Hypertension Brother   . Stroke Father 67       TIA    Current Outpatient Medications on File Prior to Visit  Medication Sig Dispense Refill  . atorvastatin (LIPITOR) 80 MG tablet TAKE ONE TABLET BY MOUTH DAILY. 90 tablet 3  . Difluprednate 0.05 % EMUL One drop in operative eye BID starting after surgery for 3 weeks    . diltiazem (CARDIZEM CD) 240 MG 24 hr capsule Take 240 mg by mouth at bedtime.     Marland Kitchen levothyroxine (SYNTHROID, LEVOTHROID) 75 MCG tablet Take 75 mcg by mouth daily before breakfast.    . losartan (COZAAR) 50 MG tablet Take 1 tablet (50 mg total) by mouth daily. 90 tablet 3  .  ofloxacin (OCUFLOX) 0.3 % ophthalmic solution Instill 1 drop BID in operative eye starting 2 days prior to surgery and continue 3 weeks after surgery.    . saw palmetto 500 MG capsule Take 500 mg by mouth daily.     . sotalol (BETAPACE) 160 MG tablet TAKE (1) TABLET BY MOUTH TWICE DAILY. 180 tablet 2  . warfarin (COUMADIN) 5 MG tablet TAKE 1 TABLET BY MOUTH DAILY EXCEPT ON THURSDAYS TAKE 1 AND 1/2 TABLETS. 100 tablet 3   No current facility-administered medications on file prior to visit.     Allergies  Allergen Reactions  . Fexofenadine Hcl Palpitations    Allegra    Social History   Substance and Sexual Activity  Alcohol Use No  . Alcohol/week: 0.0 oz    Social History   Tobacco Use  Smoking Status Never Smoker  Smokeless Tobacco Never Used    Review of Systems  Constitutional: Negative.   HENT: Negative.   Eyes: Negative.   Respiratory: Negative.   Cardiovascular: Negative.   Gastrointestinal: Negative.   Genitourinary: Negative.   Musculoskeletal: Negative.   Skin: Negative.   Neurological: Negative.   Endo/Heme/Allergies: Bruises/bleeds easily.  Psychiatric/Behavioral: Negative.     Objective   Vitals:   12/15/17 1025  BP: 137/78  Pulse: 82  Temp: 98 F (36.7 C)    Physical Exam  Constitutional: He is oriented to person, place, and time. He appears well-developed and well-nourished.  Cardiovascular: Normal rate, regular rhythm and normal heart sounds. Exam reveals no gallop and no friction rub.  No murmur heard. Pulmonary/Chest: Effort normal and breath sounds normal. No stridor. No respiratory distress. He has no wheezes. He has no rales.  Abdominal: Soft. Bowel sounds are normal. He exhibits no distension and no mass. There is no tenderness. There is no rebound and no guarding. A hernia is present.  Easily reducible right anal hernia.  Genitourinary:  Genitourinary Comments: Genitourinary examination within normal limits  Neurological: He is alert  and oriented to person, place, and time.  Skin: Skin is warm and dry.  Vitals reviewed. CT scan report reviewed  Assessment  Right inguinal hernia, recurrent, currently asymptomatic Chronic anticoagulation, pacemaker dependent Plan   As patient is asymptomatic from his inguinal hernia, there is no need for surgical repair at this time.  Patient understands this and agrees.  Should he become symptomatic, he was told to call my office.  Literature was given.  Follow-up as needed.

## 2017-12-15 NOTE — Patient Instructions (Signed)

## 2017-12-28 ENCOUNTER — Ambulatory Visit (INDEPENDENT_AMBULATORY_CARE_PROVIDER_SITE_OTHER): Payer: Medicare Other | Admitting: *Deleted

## 2017-12-28 DIAGNOSIS — Z5181 Encounter for therapeutic drug level monitoring: Secondary | ICD-10-CM

## 2017-12-28 DIAGNOSIS — I4891 Unspecified atrial fibrillation: Secondary | ICD-10-CM | POA: Diagnosis not present

## 2017-12-28 LAB — POCT INR: INR: 2.3

## 2017-12-28 NOTE — Patient Instructions (Signed)
Continue coumadin 1 tablet daily except 1 1/2 tablets on Thursdays Recheck in 6 weeks 

## 2018-01-09 ENCOUNTER — Ambulatory Visit (INDEPENDENT_AMBULATORY_CARE_PROVIDER_SITE_OTHER): Payer: Medicare Other | Admitting: *Deleted

## 2018-01-09 DIAGNOSIS — I495 Sick sinus syndrome: Secondary | ICD-10-CM

## 2018-01-10 ENCOUNTER — Encounter: Payer: Self-pay | Admitting: Cardiology

## 2018-01-10 LAB — CUP PACEART REMOTE DEVICE CHECK
Battery Remaining Longevity: 140 mo
Brady Statistic AP VP Percent: 0.04 %
Brady Statistic AP VS Percent: 99.33 %
Brady Statistic AS VP Percent: 0 %
Brady Statistic AS VS Percent: 0.63 %
Brady Statistic RV Percent Paced: 0.05 %
Date Time Interrogation Session: 20190520113227
Implantable Lead Implant Date: 20080902
Implantable Lead Implant Date: 20080902
Implantable Lead Location: 753859
Implantable Lead Model: 5076
Implantable Lead Model: 5076
Lead Channel Impedance Value: 323 Ohm
Lead Channel Impedance Value: 456 Ohm
Lead Channel Impedance Value: 456 Ohm
Lead Channel Pacing Threshold Amplitude: 0.875 V
Lead Channel Sensing Intrinsic Amplitude: 1.25 mV
Lead Channel Sensing Intrinsic Amplitude: 1.25 mV
Lead Channel Sensing Intrinsic Amplitude: 21.5 mV
Lead Channel Sensing Intrinsic Amplitude: 21.5 mV
Lead Channel Setting Pacing Amplitude: 2.5 V
MDC IDC LEAD LOCATION: 753860
MDC IDC MSMT BATTERY VOLTAGE: 3.05 V
MDC IDC MSMT LEADCHNL RA PACING THRESHOLD AMPLITUDE: 0.75 V
MDC IDC MSMT LEADCHNL RA PACING THRESHOLD PULSEWIDTH: 0.4 ms
MDC IDC MSMT LEADCHNL RV IMPEDANCE VALUE: 361 Ohm
MDC IDC MSMT LEADCHNL RV PACING THRESHOLD PULSEWIDTH: 0.4 ms
MDC IDC PG IMPLANT DT: 20180509
MDC IDC SET LEADCHNL RA PACING AMPLITUDE: 2 V
MDC IDC SET LEADCHNL RV PACING PULSEWIDTH: 0.4 ms
MDC IDC SET LEADCHNL RV SENSING SENSITIVITY: 0.9 mV
MDC IDC STAT BRADY RA PERCENT PACED: 99.37 %

## 2018-01-10 NOTE — Progress Notes (Signed)
Remote pacemaker transmission.   

## 2018-02-08 ENCOUNTER — Ambulatory Visit (INDEPENDENT_AMBULATORY_CARE_PROVIDER_SITE_OTHER): Payer: Medicare Other | Admitting: *Deleted

## 2018-02-08 DIAGNOSIS — Z5181 Encounter for therapeutic drug level monitoring: Secondary | ICD-10-CM

## 2018-02-08 DIAGNOSIS — I4891 Unspecified atrial fibrillation: Secondary | ICD-10-CM | POA: Diagnosis not present

## 2018-02-08 LAB — POCT INR: INR: 2.3 (ref 2.0–3.0)

## 2018-02-08 NOTE — Patient Instructions (Signed)
Continue coumadin 1 tablet daily except 1 1/2 tablets on Thursdays Recheck in 6 weeks 

## 2018-03-01 ENCOUNTER — Other Ambulatory Visit: Payer: Self-pay | Admitting: Cardiology

## 2018-03-20 ENCOUNTER — Ambulatory Visit (INDEPENDENT_AMBULATORY_CARE_PROVIDER_SITE_OTHER): Payer: Medicare Other | Admitting: *Deleted

## 2018-03-20 DIAGNOSIS — I4891 Unspecified atrial fibrillation: Secondary | ICD-10-CM | POA: Diagnosis not present

## 2018-03-20 DIAGNOSIS — Z5181 Encounter for therapeutic drug level monitoring: Secondary | ICD-10-CM | POA: Diagnosis not present

## 2018-03-20 LAB — POCT INR: INR: 2.6 (ref 2.0–3.0)

## 2018-03-20 NOTE — Patient Instructions (Signed)
Continue coumadin 1 tablet daily except 1 1/2 tablets on Thursdays Recheck in 6 weeks 

## 2018-04-10 ENCOUNTER — Ambulatory Visit (INDEPENDENT_AMBULATORY_CARE_PROVIDER_SITE_OTHER): Payer: Medicare Other | Admitting: *Deleted

## 2018-04-10 DIAGNOSIS — I495 Sick sinus syndrome: Secondary | ICD-10-CM | POA: Diagnosis not present

## 2018-04-11 NOTE — Progress Notes (Signed)
Remote pacemaker transmission.   

## 2018-05-01 ENCOUNTER — Ambulatory Visit (INDEPENDENT_AMBULATORY_CARE_PROVIDER_SITE_OTHER): Payer: Medicare Other | Admitting: *Deleted

## 2018-05-01 DIAGNOSIS — Z5181 Encounter for therapeutic drug level monitoring: Secondary | ICD-10-CM

## 2018-05-01 DIAGNOSIS — I4891 Unspecified atrial fibrillation: Secondary | ICD-10-CM | POA: Diagnosis not present

## 2018-05-01 LAB — POCT INR: INR: 3 (ref 2.0–3.0)

## 2018-05-01 NOTE — Patient Instructions (Signed)
Continue coumadin 1 tablet daily except 1 1/2 tablets on Thursdays Recheck in 6 weeks 

## 2018-05-05 LAB — CUP PACEART REMOTE DEVICE CHECK
Brady Statistic AP VS Percent: 99.84 %
Brady Statistic AS VP Percent: 0 %
Brady Statistic RA Percent Paced: 99.8 %
Brady Statistic RV Percent Paced: 0.16 %
Date Time Interrogation Session: 20190819110219
Implantable Lead Implant Date: 20080902
Implantable Lead Implant Date: 20080902
Implantable Lead Location: 753859
Implantable Lead Location: 753860
Implantable Lead Model: 5076
Implantable Lead Model: 5076
Lead Channel Impedance Value: 475 Ohm
Lead Channel Pacing Threshold Pulse Width: 0.4 ms
Lead Channel Sensing Intrinsic Amplitude: 1.625 mV
Lead Channel Sensing Intrinsic Amplitude: 1.625 mV
Lead Channel Sensing Intrinsic Amplitude: 23 mV
Lead Channel Sensing Intrinsic Amplitude: 23 mV
Lead Channel Setting Pacing Amplitude: 2 V
Lead Channel Setting Pacing Amplitude: 2.5 V
Lead Channel Setting Pacing Pulse Width: 0.4 ms
Lead Channel Setting Sensing Sensitivity: 0.9 mV
MDC IDC MSMT BATTERY REMAINING LONGEVITY: 139 mo
MDC IDC MSMT BATTERY VOLTAGE: 3.03 V
MDC IDC MSMT LEADCHNL RA IMPEDANCE VALUE: 361 Ohm
MDC IDC MSMT LEADCHNL RA IMPEDANCE VALUE: 494 Ohm
MDC IDC MSMT LEADCHNL RA PACING THRESHOLD AMPLITUDE: 0.625 V
MDC IDC MSMT LEADCHNL RV IMPEDANCE VALUE: 399 Ohm
MDC IDC MSMT LEADCHNL RV PACING THRESHOLD AMPLITUDE: 0.875 V
MDC IDC MSMT LEADCHNL RV PACING THRESHOLD PULSEWIDTH: 0.4 ms
MDC IDC PG IMPLANT DT: 20180509
MDC IDC STAT BRADY AP VP PERCENT: 0.06 %
MDC IDC STAT BRADY AS VS PERCENT: 0.1 %

## 2018-05-17 ENCOUNTER — Ambulatory Visit (INDEPENDENT_AMBULATORY_CARE_PROVIDER_SITE_OTHER): Payer: Medicare Other | Admitting: Internal Medicine

## 2018-05-17 ENCOUNTER — Encounter: Payer: Self-pay | Admitting: Internal Medicine

## 2018-05-17 VITALS — BP 110/66 | HR 73 | Ht 66.0 in | Wt 146.0 lb

## 2018-05-17 DIAGNOSIS — I480 Paroxysmal atrial fibrillation: Secondary | ICD-10-CM | POA: Diagnosis not present

## 2018-05-17 DIAGNOSIS — I10 Essential (primary) hypertension: Secondary | ICD-10-CM | POA: Diagnosis not present

## 2018-05-17 NOTE — Progress Notes (Signed)
HPI [name] returns today for follow-up. He is a very pleasant 77 year old man with symptomatic sinus node dysfunction status post pacemaker insertion, paroxysmal atrial fibrillation, on warfarin, who developed a large pacemaker pocket hematoma after undergoing pacemaker generator change out several months ago. We held his anticoagulation, and he eventually healed up. He is back to his usual activity. 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 He denies chest pain, shortness of breath, or syncope. No peripheral edema. No fevers or chills. Allergies  Allergen Reactions  . Fexofenadine Hcl Palpitations    Allegra     Current Outpatient Medications  Medication Sig Dispense Refill  . atorvastatin (LIPITOR) 40 MG tablet Take 40 mg by mouth daily.    Marland Kitchen diltiazem (CARDIZEM CD) 240 MG 24 hr capsule Take 240 mg by mouth at bedtime.     Marland Kitchen levothyroxine (SYNTHROID, LEVOTHROID) 75 MCG tablet Take 75 mcg by mouth daily before breakfast.    . losartan (COZAAR) 50 MG tablet Take 1 tablet (50 mg total) by mouth daily. 90 tablet 3  . saw palmetto 500 MG capsule Take 500 mg by mouth daily.     . sotalol (BETAPACE) 160 MG tablet TAKE (1) TABLET BY MOUTH TWICE DAILY. 180 tablet 2  . warfarin (COUMADIN) 5 MG tablet TAKE 1 TABLET BY MOUTH DAILY EXCEPT ON THURSDAYS TAKE 1 AND 1/2 TABLETS. 100 tablet 3   No current facility-administered medications for this visit.      Past Medical History:  Diagnosis Date  . AAA (abdominal aortic aneurysm) ([place])   . Arrhythmia   . Atrial fibrillation ([place])   . Back pain, chronic   . Coronary artery disease 1996 and 1997   angioplasty of LAD  . Diabetes mellitus without complication ([place])    MILD TYPE 2  . High cholesterol   . Hypertension   . Left shoulder pain 2014  . Sleep apnea Sept. 2008   USES C-PAP    ROS:   All systems reviewed and negative except as noted in the HPI.   Past Surgical History:  Procedure Laterality Date  .  CARDIAC CATHETERIZATION  1996  . CARDIAC CATHETERIZATION  06/18/1996   mid-circ tandem overlying stents with 20%,70%mid-LAD naroowin stent site in circ with 70% stenosis in the mid to distal marginal branch,tandem 95% and 80% stenosis in the proximal portion of the posterior descending branch of RCA   . CATARACT EXTRACTION  1983  . COLONOSCOPY N/A 09/04/2014   Procedure: COLONOSCOPY;  Surgeon: [name], MD;  [facility];  Service: Endoscopy;  Laterality: N/A;  1030  . CORONARY ANGIOPLASTY WITH STENT PLACEMENT  08/18/1995   PTA/STENT CIRC  . CORONARY ANGIOPLASTY WITH STENT PLACEMENT  06/18/1996   PTCA  posterior descending  branch of the RCA 95 AND 80% TO LESSTHAN 20%  . [place]  . DOPPLER ECHOCARDIOGRAPHY  04/17/2010   EF =50-55%; mild concentric LVH,LAE, AORTIC SCLEROSIS.TRACE TO MILD CENTRAL AI, TRACE MITRAL REGURGITATION, CANNOT ASSESS DIASTOLIC FUNCTIONDUE TO UNDERLYING RHYTHM,PACERMAKER WIRE IN THE RA/RV  . DOPPLER ECHOCARDIOGRAPHY  04/21/2007   DONE IN NEW BERN-- MILD MITRAL REGURG., MILD TRICUSPID REGURG. WITH MILD PULM. HTN, MILD CONCENTRIC LEFT VENTRICULAR HYPERTROPHY,MILD LEFT ATRIAL ENLARGEMENT.  . EYE SURGERY  1983   Cataract  . HERNIA REPAIR Bilateral 2000  . INSERT / REPLACE / REMOVE PACEMAKER  SEPT 2008   INSERT DUAL -CHAMBER-medtronic adapta ADDR01 serial   [id number]  .  NM MYOCAR MULTIPLE W/SPECT  04/17/2010   EF 50%; LOW NORMAL LV FUNCTION,  . PACEMAKER INSERTION  2008   DUAL-CHAMBER  . PPM GENERATOR CHANGEOUT N/A 12/29/2016   Procedure: PPM Generator Changeout;  Surgeon: Evans Lance, MD;  Location: [facility];  Service: Cardiovascular;  Laterality: N/A;     Family History  Problem Relation Age of Onset  . Peripheral vascular disease Mother   . Hypertension Mother   . Diabetes Mother   . Heart disease Mother   . Hyperlipidemia Mother   . Peripheral vascular disease Sister   . Cancer Sister   . Diabetes Sister     . Heart disease Sister        Stent- Heart Disease before age 25  . Hyperlipidemia Sister   . Hypertension Sister   . Peripheral vascular disease Brother   . Diabetes Brother   . Heart disease Brother        CHF and Heart Disease before age 50  . Hyperlipidemia Brother   . Hypertension Brother   . Stroke Father 52       TIA     Social History   Socioeconomic History  . Marital status: Married    Spouse name: Not on file  . Number of children: Not on file  . Years of education: Not on file  . Highest education level: Not on file  Occupational History  . Not on file  Social Needs  . Financial resource strain: Not on file  . Food insecurity:    Worry: Not on file    Inability: Not on file  . Transportation needs:    Medical: Not on file    Non-medical: Not on file  Tobacco Use  . Smoking status: Never Smoker  . Smokeless tobacco: Never Used  Substance and Sexual Activity  . Alcohol use: No    Alcohol/week: 0.0 standard drinks  . Drug use: No  . Sexual activity: Not on file  Lifestyle  . Physical activity:    Days per week: Not on file    Minutes per session: Not on file  . Stress: Not on file  Relationships  . Social connections:    Talks on phone: Not on file    Gets together: Not on file    Attends religious service: Not on file    Active member of club or organization: Not on file    Attends meetings of clubs or organizations: Not on file    Relationship status: Not on file  . Intimate partner violence:    Fear of current or ex partner: Not on file    Emotionally abused: Not on file    Physically abused: Not on file    Forced sexual activity: Not on file  Other Topics Concern  . Not on file  Social History Narrative   Controls his diabetes through diet and exercise.     BP 110/66   Pulse 73   Ht 5\' 6"  (1.676 m)   Wt 146 lb (66.2 kg)   SpO2 98%   BMI 23.57 kg/m   Physical Exam:  Well appearing NAD HEENT: Unremarkable Neck:  No JVD, no  thyromegally Lymphatics:  No adenopathy Back:  No CVA tenderness Lungs:  Clear with no wheezes HEART:  Regular rate rhythm, no murmurs, no rubs, no clicks Abd:  soft, positive bowel sounds, no organomegally, no rebound, no guarding Ext:  2 plus pulses, no edema, no cyanosis, no clubbing Skin:  No  rashes no nodules Neuro:  CN II through XII intact, motor grossly intact   DEVICE  Normal device function.  See PaceArt for details.   Assess/Plan: 1. Sinus node dysfunction - he is asymptomatic, s/p PPM insertion 2. PPM - his medtronic DDD PM is working normally. 3. PAF - he is maintaining NSR 99% of the time. He will continue hi ssotalol. 4. HTN - his blood pressure is well controlled. No change in his meds.  Mikle Bosworth.D.

## 2018-05-17 NOTE — Patient Instructions (Signed)
Medication Instructions:  Your physician recommends that you continue on your current medications as directed. Please refer to the Current Medication list given to you today.   Labwork: NONE   Testing/Procedures: NONE   Follow-Up: Your physician wants you to follow-up in: 1 Year with Dr. Taylor. You will receive a reminder letter in the mail two months in advance. If you don't receive a letter, please call our office to schedule the follow-up appointment.   Any Other Special Instructions Will Be Listed Below (If Applicable).     If you need a refill on your cardiac medications before your next appointment, please call your pharmacy.  Thank you for choosing [facility]!   

## 2018-05-29 ENCOUNTER — Encounter: Payer: Self-pay | Admitting: Neurology

## 2018-06-01 ENCOUNTER — Encounter: Payer: Self-pay | Admitting: Neurology

## 2018-06-01 ENCOUNTER — Ambulatory Visit (INDEPENDENT_AMBULATORY_CARE_PROVIDER_SITE_OTHER): Payer: Medicare Other | Admitting: Neurology

## 2018-06-01 ENCOUNTER — Other Ambulatory Visit: Payer: Self-pay | Admitting: Cardiology

## 2018-06-01 VITALS — BP 148/82 | HR 76 | Ht 66.0 in | Wt 148.0 lb

## 2018-06-01 DIAGNOSIS — Z45018 Encounter for adjustment and management of other part of cardiac pacemaker: Secondary | ICD-10-CM

## 2018-06-01 DIAGNOSIS — I714 Abdominal aortic aneurysm, without rupture, unspecified: Secondary | ICD-10-CM

## 2018-06-01 DIAGNOSIS — G4733 Obstructive sleep apnea (adult) (pediatric): Secondary | ICD-10-CM

## 2018-06-01 DIAGNOSIS — Z9989 Dependence on other enabling machines and devices: Secondary | ICD-10-CM | POA: Diagnosis not present

## 2018-06-01 NOTE — Progress Notes (Signed)
SLEEP MED[facility]INE CL[facility]IC   Provider:  Larey Seat, M D  Referring Provider: Sharilyn Sites, MD Primary Care Physician:  Sharilyn Sites, MD  Chief Complaint  Patient presents with  . Follow-up    pt alone, rm 11. pt states that everything is well. no concerns. DME [employer]   Interval history from 01 June 2018, Johnny Reynolds. Max is a 77 year old Caucasian gentleman who last year received a new pacemaker.  He has been a compliant CPAP patient ever since starting therapy and is using an AutoSet between 5 and 14 cmH2O with 3 cm pressure relief.  His residual AHI this year is slightly higher for the last 30 days at 6.4/h.  95th percentile pressure is 13.4 cmH2O and there are 19 minutes of Cheyne-Stokes respirations which is a new finding.   The average use at time of 7 hours and 23 minutes, he is highly compliant and he does not feel that his sleep quality has changed. He has not noted more air leaks, he has no aerophagia. I will need to lift the upper pressure window by 2 cm water.    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 He got a new pacemaker this summer with a battery life of 12 years or more.  He has 100% compliance for the last 30 days was 6 hours and 53 minutes on average daily use, he is using AutoSet between 5 and 14 cm water with 3 cm expiratory pressure relief. His residual AHI is 5.0 all apneas are obstructive in nature is 95% percentile pressure is 13.9. He doesn't like more pressure - feels this is comfortable. Epworth 0 points, FSS 9 cm water, he is asleep as soon as he puts CPAP on, CD.    HPI:  Johnny Reynolds is a 78 y.o. male , seen here as a referral from Dr. Hilma Favors for re initiation of sleep therapy and supplies,  Johnny Reynolds reports that in the autumn of 2008 he underwent a sleep study and was diagnosed with sleep related hypoxemia. He did have a significant amount of apnea but was  prescribed CPAP in an attempt to rise the 02 nadir. He was able to sleep supine when he used his CPAP broke up at supine and actually felt rested and restored. He was prescribed a ResMed CPAP machine but his supplies of Theme park manager. He had been tested and diagnosed in [place], [place]. He had no new supplies for several years. He finally got some today- samples from [facility]. He reports he uses a full face mask covering nose and mouth but he also has a mustache and chin beard which will interfere with his air seal. Sleep habits are as follows: He falls asleep on his back he wakes up supine as well. His bedroom is cool, quiet and dark. He shares the room with his spouse.  The patient usually goes to bed around 10:30 he will be asleep rather promptly. He wakes up between 6 and 7 AM and limits his water intake to not have nocturia interrupting his sleep. He reports no headaches in the morning nor waking him from sleep, he feels usually refreshed and restored in the morning. He has not been retested over 8 years.  Sleep medical history and family sleep history: No history of sleep walking or night terrors. Brother is a sleep walker.   PMHx: Diet controlled diabetes, hypertension controlled  on 3 medications hypercholesterolemia, aspirin daily. L-thyroxine/ synthroid for hypothyroidism. Social history: married, Music therapist, retired Warden/ranger. Non smoker, non drinker.  Caffeine: rare   11-03-15 Johnny Reynolds is here today to follow-up on his recent sleep studies. He underwent first a split-night polysomnography but unfortunately was not sufficiently titrated. This took place on 08/01/2015 he was diagnosed with a very high apnea hypotony index of 62.6 his lowest oxygen saturation was 73% the desaturation index was 57.4 he just scraped by under 30 minutes of desaturation and he did not retain CO2. The attempt to titrated to CPAP did not work out well there were only 2.9 minutes  of sleep recorded at the final setting of 10 cm water pressure. In addition I asked for an oxygen titration showed CPAP not work in a full night study. The second study showed CPAP at 11 cm water to be effective with an AHI of 0.0 and was 68 minutes of sleep and 26.5 minutes of REM sleep at the setting. The EKG noted sinus rhythm and there was no longer oxygen desaturation seen.  Allergies Mr. Maxillary reports that he has excellent sleep quality no endorsed again the Epworth score at 0 points, fatigue severity at 9 points and the geriatric depression score at 0 points I was able to obtain a download of his CPAP machine he has used the machine 100% of the last 30 days and each night over 4 hours. This is the highest possible compliance with an average of 7 hours and 50 minutes. He is using AutoSet between 5 and 12 cm water the 91st percentile is 11.9 cm. He sleeps supine only.  He has one nocturia break at night. He noted he dreams again. His residual AHI is 11.2 . Baseline was 62.6 AHI.    Interval history from 05/26/2016. I have the pleasure of seeing Johnny Reynolds today who has been 100% compliant user of his CPAP AutoSet. Pressure window is between 5 and 12 was 3 cm EPR residual AHI is 6.8 and nearly all residual events are obstructive in nature. His 95th percentile pressure is 12 cm. For this reason I would like to raise the upper pressure window by 2 cm to allow room to go.He has a surgery on Friday for tearduct opening. He was taken off aspirin, and is concerned that the mask will aggravate the post operative  lesion. He is using a FFM. He has little air leak.   Review of Systems: Out of a complete 14 system review, the patient complains of only the following sther reviewed systems are negativ and sleeps supe. Johnny Reynolds again is not excessively sleeper, he never naps and he endorsed the Epworth Sleepiness Scale at 0 out of 24 the fatigue severity scale at 9 out of 63 score he also did not endorse any  depression symptoms.   Social History   Socioeconomic History  . Marital status: Married    Spouse name: Not on file  . Number of children: Not on file  . Years of education: Not on file  . Highest education level: Not on file  Occupational History  . Not on file  Social Needs  . Financial resource strain: Not on file  . Food insecurity:    Worry: Not on file    Inability: Not on file  . Transportation needs:    Medical: Not on file    Non-medical: Not on file  Tobacco Use  . Smoking status: Never Smoker  . Smokeless tobacco:  Never Used  Substance and Sexual Activity  . Alcohol use: No    Alcohol/week: 0.0 standard drinks  . Drug use: No  . Sexual activity: Not on file  Lifestyle  . Physical activity:    Days per week: Not on file    Minutes per session: Not on file  . Stress: Not on file  Relationships  . Social connections:    Talks on phone: Not on file    Gets together: Not on file    Attends religious service: Not on file    Active member of club or organization: Not on file    Attends meetings of clubs or organizations: Not on file    Relationship status: Not on file  . Intimate partner violence:    Fear of current or ex partner: Not on file    Emotionally abused: Not on file    Physically abused: Not on file    Forced sexual activity: Not on file  Other Topics Concern  . Not on file  Social History Narrative   Controls his diabetes through diet and exercise.    Family History  Problem Relation Age of Onset  . Peripheral vascular disease Mother   . Hypertension Mother   . Diabetes Mother   . Heart disease Mother   . Hyperlipidemia Mother   . Peripheral vascular disease Sister   . Cancer Sister   . Diabetes Sister   . Heart disease Sister        Stent- Heart Disease before age 33  . Hyperlipidemia Sister   . Hypertension Sister   . Peripheral vascular disease Brother   . Diabetes Brother   . Heart disease Brother        CHF and Heart Disease  before age 60  . Hyperlipidemia Brother   . Hypertension Brother   . Stroke Father 85       TIA    Past Medical History:  Diagnosis Date  . AAA (abdominal aortic aneurysm) ([place])   . Arrhythmia   . Atrial fibrillation ([place])   . Back pain, chronic   . Coronary artery disease 1996 and 1997   angioplasty of LAD  . Diabetes mellitus without complication ([place])    MILD TYPE 2  . High cholesterol   . Hypertension   . Left shoulder pain 2014  . Sleep apnea Sept. 2008   USES C-PAP    Past Surgical History:  Procedure Laterality Date  . CARDIAC CATHETERIZATION  1996  . CARDIAC CATHETERIZATION  06/18/1996   mid-circ tandem overlying stents with 20%,70%mid-LAD naroowin stent site in circ with 70% stenosis in the mid to distal marginal branch,tandem 95% and 80% stenosis in the proximal portion of the posterior descending branch of RCA   . CATARACT EXTRACTION  1983  . COLONOSCOPY N/A 09/04/2014   Procedure: COLONOSCOPY;  Surgeon: [name], MD;  [facility];  Service: Endoscopy;  Laterality: N/A;  1030  . CORONARY ANGIOPLASTY WITH STENT PLACEMENT  08/18/1995   PTA/STENT CIRC  . CORONARY ANGIOPLASTY WITH STENT PLACEMENT  06/18/1996   PTCA  posterior descending  branch of the RCA 95 AND 80% TO LESSTHAN 20%  . [place]  . DOPPLER ECHOCARDIOGRAPHY  04/17/2010   EF =50-55%; mild concentric LVH,LAE, AORTIC SCLEROSIS.TRACE TO MILD CENTRAL AI, TRACE MITRAL REGURGITATION, CANNOT ASSESS DIASTOLIC FUNCTIONDUE TO UNDERLYING RHYTHM,PACERMAKER WIRE IN THE RA/RV  . DOPPLER ECHOCARDIOGRAPHY  04/21/2007   DONE IN [place]-- MILD MITRAL REGURG.,  MILD TRICUSPID REGURG. WITH MILD PULM. HTN, MILD CONCENTRIC LEFT VENTRICULAR HYPERTROPHY,MILD LEFT ATRIAL ENLARGEMENT.  . EYE SURGERY  1983   Cataract  . HERNIA REPAIR Bilateral 2000  . INSERT / REPLACE / REMOVE PACEMAKER  SEPT 2008   INSERT DUAL -CHAMBER-medtronic adapta ADDR01 serial   [id number]  . NM MYOCAR MULTIPLE  W/SPECT  04/17/2010   EF 50%; LOW NORMAL LV FUNCTION,  . PACEMAKER INSERTION  2008   DUAL-CHAMBER  . PPM GENERATOR CHANGEOUT N/A 12/29/2016   Procedure: PPM Generator Changeout;  Surgeon: Evans Lance, MD;  Location: [facility];  Service: Cardiovascular;  Laterality: N/A;    Current Outpatient Medications  Medication Sig Dispense Refill  . atorvastatin (LIPITOR) 40 MG tablet Take 40 mg by mouth daily.    Marland Kitchen diltiazem (CARDIZEM CD) 240 MG 24 hr capsule Take 240 mg by mouth at bedtime.     Marland Kitchen levothyroxine (SYNTHROID, LEVOTHROID) 75 MCG tablet Take 75 mcg by mouth daily before breakfast.    . losartan (COZAAR) 50 MG tablet Take 1 tablet (50 mg total) by mouth daily. 90 tablet 3  . saw palmetto 500 MG capsule Take 500 mg by mouth daily.     . sotalol (BETAPACE) 160 MG tablet TAKE (1) TABLET BY MOUTH TWICE DAILY. 180 tablet 2  . warfarin (COUMADIN) 5 MG tablet TAKE 1 TABLET BY MOUTH DAILY EXCEPT ON THURSDAYS TAKE 1 AND 1/2 TABLETS. 100 tablet 3   No current facility-administered medications for this visit.     Allergies as of 06/01/2018 - Review Complete 06/01/2018  Allergen Reaction Noted  . Fexofenadine hcl Palpitations 12/05/2011    Vitals: BP (!) 148/82   Pulse 76   Ht 5\' 6"  (1.676 m)   Wt 148 lb (67.1 kg)   BMI 23.89 kg/m  Last Weight:  Wt Readings from Last 1 Encounters:  06/01/18 148 lb (67.1 kg)   TGY:BWLS mass index is 23.89 kg/m.       Last Height:   Ht Readings from Last 1 Encounters:  06/01/18 5\' 6"  (1.676 m)    Physical exam:  General: The patient is awake, alert and appears not in acute distress. The patient is well groomed. Head: Normocephalic, atraumatic. Neck is supple. Mallampati 3  neck circumference: 15.25 . Nasal airflow unrestricted. Retrognathia is seen.  Cardiovascular:  Regular rate and rhythm, without murmurs or carotid bruit, and without distended neck veins. Respiratory: Lungs are clear to auscultation. Skin:  Without evidence of edema,  or rash Trunk: BMI is 23.8.The patient's posture is erect  Neurologic exam : The patient is awake and alert,oriented to place and time. Mood and affect are appropriate towards me.  Cranial nerves: Pupils are equal and briskly reactive to light.  Extraocular movements  in vertical and horizontal planes intact and without nystagmus. Hearing to finger rub intact. Facial motor strength is symmetric and tongue and uvula move midline. Shoulder shrug was symmetrical.  DTR symmetric, no babinski response.    The patient was advised of the nature of the diagnosed sleep disorder , the treatment options and risks for general a health and wellness arising from not treating the condition.  I spent more than 25 minutes of face to face time with the patient. Greater than 50% of time was spent in counseling and coordination of care. We have discussed the diagnosis and differential and I answered the patient's questions.     Assessment:  After physical and neurologic examination, review of laboratory studies,  Results  of polysomnography/ neurophysiology testing and pre-existing records as far as provided in visit, my assessment is   1)  Mr. Miley is using a SIMPLUS full-face mask in large size.  A FFM was the patient's preference. 100% compliance but with new evidence of Cheynes - Stokes respiration, increasing the AHI to above 5.  I will rise the pressure of the AutoSet window to14 cm water. RV in 12 month.    ICarmen Ewan Grau MD  06/01/2018   Any future RV with NP.   CC: Sharilyn Sites, [address] [place], [place] [address]

## 2018-06-12 ENCOUNTER — Ambulatory Visit (INDEPENDENT_AMBULATORY_CARE_PROVIDER_SITE_OTHER): Payer: Medicare Other | Admitting: *Deleted

## 2018-06-12 DIAGNOSIS — I4891 Unspecified atrial fibrillation: Secondary | ICD-10-CM | POA: Diagnosis not present

## 2018-06-12 DIAGNOSIS — Z5181 Encounter for therapeutic drug level monitoring: Secondary | ICD-10-CM | POA: Diagnosis not present

## 2018-06-12 LAB — POCT INR: INR: 2.6 (ref 2.0–3.0)

## 2018-06-12 NOTE — Patient Instructions (Signed)
Continue coumadin 1 tablet daily except 1 1/2 tablets on Thursdays Recheck in 6 weeks 

## 2018-07-06 ENCOUNTER — Other Ambulatory Visit: Payer: Self-pay

## 2018-07-06 ENCOUNTER — Ambulatory Visit (HOSPITAL_COMMUNITY)
Admission: RE | Admit: 2018-07-06 | Discharge: 2018-07-06 | Disposition: A | Discharge status: Home / Self Care | Payer: Medicare Other | Source: Ambulatory Visit | Attending: Family | Admitting: Family

## 2018-07-06 ENCOUNTER — Ambulatory Visit (INDEPENDENT_AMBULATORY_CARE_PROVIDER_SITE_OTHER)
Admission: RE | Admit: 2018-07-06 | Discharge: 2018-07-06 | Disposition: A | Discharge status: Home / Self Care | Payer: Medicare Other | Source: Ambulatory Visit | Attending: Family | Admitting: Family

## 2018-07-06 ENCOUNTER — Encounter: Payer: Self-pay | Admitting: Family

## 2018-07-06 ENCOUNTER — Ambulatory Visit (INDEPENDENT_AMBULATORY_CARE_PROVIDER_SITE_OTHER): Payer: Medicare Other | Admitting: Family

## 2018-07-06 VITALS — BP 178/96 | HR 82 | Resp 18 | Ht 66.0 in | Wt 145.7 lb

## 2018-07-06 DIAGNOSIS — I714 Abdominal aortic aneurysm, without rupture, unspecified: Secondary | ICD-10-CM

## 2018-07-06 DIAGNOSIS — R0989 Other specified symptoms and signs involving the circulatory and respiratory systems: Secondary | ICD-10-CM | POA: Diagnosis present

## 2018-07-06 NOTE — Progress Notes (Signed)
[facility] [place] [facility]   CC: Follow up Abdominal Aortic Aneurysm  History of Present Illness  Johnny Reynolds is a 77 y.o. (07-25-1941) male who returns for follow up surveillance of his AAA. Dr. Donnetta Hutching had been monitoring pt for this.  Previous studies demonstrate an AAA, measuring 3.10 cm.   He has c-spine issues, but no lumbar spine issues, denies abdominal pain.  He has never used tobacco.  He denies claudication type symptoms in his legs with walking. He denies any history of stroke or TIA symptoms, denies any history of MI, but did have angina in 1996, stent placed in LAD.  Pt states his blood pressure at home runs 120's/60-70's; states he was aggravated by traffic getting here today.  He has a pacemaker.   Diabetic: Yes, pt states his last AIC was 5.8, diagnosed in 2005, he was taken off his metformin per pt. last serum glucose result on file was 142 on 12-06-17, serum creatinine on that date was 0.86 Tobacco [place]: non-smoker   He takes a daily statin and a beta blocker. He takes warfarin, has a history of atrial fib.   Past Medical History:  Diagnosis Date  . AAA (abdominal aortic aneurysm) ([facility])   . Arrhythmia   . Atrial fibrillation ([facility])   . Back pain, chronic   . Coronary artery disease 1996 and 1997   angioplasty of LAD  . Diabetes mellitus without complication ([facility])    MILD TYPE 2  . High cholesterol   . Hypertension   . Left shoulder pain 2014  . Sleep apnea Sept. 2008   USES C-PAP   Past Surgical History:  Procedure Laterality Date  . CARDIAC CATHETERIZATION  1996  . CARDIAC CATHETERIZATION  06/18/1996   mid-circ tandem overlying stents with 20%,70%mid-LAD naroowin stent site in circ with 70% stenosis in the mid to distal marginal branch,tandem 95% and 80% stenosis in the proximal portion of the posterior descending branch of RCA   . CATARACT EXTRACTION  1983  . COLONOSCOPY N/A 09/04/2014   Procedure: COLONOSCOPY;  Surgeon: [name], MD;  [facility];  Service: Endoscopy;  Laterality: N/A;  1030  . CORONARY ANGIOPLASTY WITH STENT PLACEMENT  08/18/1995   PTA/STENT CIRC  . CORONARY ANGIOPLASTY WITH STENT PLACEMENT  06/18/1996   PTCA  posterior descending  branch of the RCA 95 AND 80% TO LESSTHAN 20%  . [place]  . DOPPLER ECHOCARDIOGRAPHY  04/17/2010   EF =50-55%; mild concentric LVH,LAE, AORTIC SCLEROSIS.TRACE TO MILD CENTRAL AI, TRACE MITRAL REGURGITATION, CANNOT ASSESS DIASTOLIC FUNCTIONDUE TO UNDERLYING RHYTHM,PACERMAKER WIRE IN THE RA/RV  . DOPPLER ECHOCARDIOGRAPHY  04/21/2007   DONE IN [place]-- MILD MITRAL REGURG., MILD TRICUSPID REGURG. WITH MILD PULM. HTN, MILD CONCENTRIC LEFT VENTRICULAR HYPERTROPHY,MILD LEFT ATRIAL ENLARGEMENT.  . EYE SURGERY  1983   Cataract  . HERNIA REPAIR Bilateral 2000  . INSERT / REPLACE / REMOVE PACEMAKER  SEPT 2008   INSERT DUAL -CHAMBER-medtronic adapta ADDR01 serial   [id number]  . NM MYOCAR MULTIPLE W/SPECT  04/17/2010   EF 50%; LOW NORMAL LV FUNCTION,  . PACEMAKER INSERTION  2008   DUAL-CHAMBER  . PPM GENERATOR CHANGEOUT N/A 12/29/2016   Procedure: PPM Generator Changeout;  Surgeon: Evans Lance, MD;  Location: [facility];  Service: Cardiovascular;  Laterality: N/A;   Social History Social History   Socioeconomic History  . Marital status: Married    Spouse name: Not on file  .  Number of children: Not on file  . Years of education: Not on file  . Highest education level: Not on file  Occupational History  . Not on file  Social Needs  . Financial resource strain: Not on file  . Food insecurity:    Worry: Not on file    Inability: Not on file  . Transportation needs:    Medical: Not on file    Non-medical: Not on file  Tobacco Use  . Smoking status: Never Smoker  . Smokeless tobacco: Never Used  Substance and Sexual Activity  . Alcohol use: No    Alcohol/week: 0.0 standard drinks  . Drug use: No  . Sexual  activity: Not on file  Lifestyle  . Physical activity:    Days per week: Not on file    Minutes per session: Not on file  . Stress: Not on file  Relationships  . Social connections:    Talks on phone: Not on file    Gets together: Not on file    Attends religious service: Not on file    Active member of club or organization: Not on file    Attends meetings of clubs or organizations: Not on file    Relationship status: Not on file  . Intimate partner violence:    Fear of current or ex partner: Not on file    Emotionally abused: Not on file    Physically abused: Not on file    Forced sexual activity: Not on file  Other Topics Concern  . Not on file  Social History Narrative   Controls his diabetes through diet and exercise.   Family History Family History  Problem Relation Age of Onset  . Peripheral vascular disease Mother   . Hypertension Mother   . Diabetes Mother   . Heart disease Mother   . Hyperlipidemia Mother   . Peripheral vascular disease Sister   . Cancer Sister   . Diabetes Sister   . Heart disease Sister        Stent- Heart Disease before age 5  . Hyperlipidemia Sister   . Hypertension Sister   . Peripheral vascular disease Brother   . Diabetes Brother   . Heart disease Brother        CHF and Heart Disease before age 30  . Hyperlipidemia Brother   . Hypertension Brother   . Stroke Father 94       TIA    Current Outpatient Medications on File Prior to Visit  Medication Sig Dispense Refill  . atorvastatin (LIPITOR) 40 MG tablet Take 40 mg by mouth daily.    Marland Kitchen diltiazem (CARDIZEM CD) 240 MG 24 hr capsule Take 240 mg by mouth at bedtime.     Marland Kitchen levothyroxine (SYNTHROID, LEVOTHROID) 75 MCG tablet Take 75 mcg by mouth daily before breakfast.    . losartan (COZAAR) 50 MG tablet Take 1 tablet (50 mg total) by mouth daily. 90 tablet 3  . saw palmetto 500 MG capsule Take 500 mg by mouth daily.     . sotalol (BETAPACE) 160 MG tablet TAKE (1) TABLET BY MOUTH TWICE  DAILY. 180 tablet 2  . warfarin (COUMADIN) 5 MG tablet TAKE 1 TABLET BY MOUTH DAILY EXCEPT ON THURSDAYS TAKE 1 AND 1/2 TABLETS. 100 tablet 3  . atorvastatin (LIPITOR) 80 MG tablet TAKE ONE TABLET BY MOUTH DAILY. (Patient not taking: Reported on 07/06/2018) 90 tablet 1   No current facility-administered medications on file prior to visit.  Allergies  Allergen Reactions  . Fexofenadine Hcl Palpitations    Allegra    ROS: See HPI for pertinent positives and negatives.  Physical Examination  Vitals:   07/06/18 1038  BP: (!) 178/96  Pulse: 82  Resp: 18  SpO2: 99%  Weight: 145 lb 11.2 oz (66.1 kg)  Height: 5\' 6"  (1.676 m)   Body mass index is 23.52 kg/m.  General: A&O x 3, WD, fit appearing male. HEENT: Grossly intact and WNL.  Pulmonary: Sym exp, respirations are non labored, good air movement in all fields, CTAB, no rales, rhonchi, or wheezes. Cardiac: Regular rhythm and rate, no deteted murmur. Pacemaker palpated left side of chest.  Carotid Bruits Right Left   Negative Negative   Adominal aortic pulse is palpable on expiration only.  Radial pulses are 2+ palpable bilaterally                          VASCULAR EXAM:                                                                                                         LE Pulses Right Left       FEMORAL  2+ palpable  3+ palpable        POPLITEAL  3+ palpable   2+ palpable       POSTERIOR TIBIAL  2+ palpable   2+ palpable        DORSALIS PEDIS      ANTERIOR TIBIAL not palpable  not palpable     Gastrointestinal: soft, NTND, -G/R, - HSM, - masses palpated, - CVAT B. Musculoskeletal: M/S 5/5 throughout, Extremities without ischemic changes. Skin: No rashes, no ulcers, no cellulitis.   Neurologic: CN 2-12 intact, Pain and light touch intact in extremities are intact, Motor exam as listed above. Psychiatric: Normal thought content, mood appropriate to clinical situation.    DATA  AAA Duplex  (07/06/2018):  Previous size: 3.3 cm (Date: 01-03-17), Right CIA: 1.4 cm; Left CIA: 1.5 cm  Current size:  3.0 cm (Date: 07-06-18); Right CIA: 1.3 cm; Left CIA: 1.5 cm  Bilateral popliteal artery duplex (07-06-18) demonstrates no popliteal artery aneurysm.    Medical Decision Making  The patient is a 77 y.o. male who presents with asymptomatic small AAA with no increase in size in 18 months, 3.0 cm today.  His popliteal pulses are prominent, he has no claudication symptoms in his feet or legs with walking, no signs of ischemia in his feet or legs. Bilateral popliteal artery duplex today (07-06-18) demonstrates no popliteal artery aneurysm. He walks daily on his treadmill and performs weight training exercises several times/week.    Based on this patient's exam and diagnostic studies, the patient will follow up in 18 months with the following studies:   Consideration for repair of AAA would be made when the size is 5.0 cm, growth > 1 cm/yr, and symptomatic status.        The patient was given information about AAA including signs, symptoms, treatment, and how  to minimize the risk of enlargement and rupture of aneurysms.    I emphasized the importance of maximal medical management including strict control of blood pressure, blood glucose, and lipid levels, antiplatelet agents, obtaining regular exercise, and continued cessation of smoking.   The patient was advised to call 911 should the patient experience sudden onset abdominal or back pain.   Thank you for allowing [place] to participate in this patient's care.  Clemon Chambers, RN, MSN, FNP-C [facility] Office: [facility] Physician: Oneida Alar  07/06/2018, 10:40 AM

## 2018-07-06 NOTE — Patient Instructions (Signed)
Abdominal Aortic Aneurysm Blood pumps away from the heart through tubes (blood vessels) called arteries. Aneurysms are weak or damaged places in the wall of an artery. It bulges out like a balloon. An abdominal aortic aneurysm happens in the main artery of the body (aorta). It can burst or tear, causing bleeding inside the body. This is an emergency. It needs treatment right away. What are the causes? The exact cause is unknown. Things that could cause this problem include:  Fat and other substances building up in the lining of a tube.  Swelling of the walls of a blood vessel.  Certain tissue diseases.  Belly (abdominal) trauma.  An infection in the main artery of the body.  What increases the risk? There are things that make it more likely for you to have an aneurysm. These include:  Being over the age of 77 years old.  Having high blood pressure (hypertension).  Being a male.  Being white.  Being very overweight (obese).  Having a family history of aneurysm.  Using tobacco products.  What are the signs or symptoms? Symptoms depend on the size of the aneurysm and how fast it grows. There may not be symptoms. If symptoms occur, they can include:  Pain (belly, side, lower back, or groin).  Feeling full after eating a small amount of food.  Feeling sick to your stomach (nauseous), throwing up (vomiting), or both.  Feeling a lump in your belly that feels like it is beating (pulsating).  Feeling like you will pass out (faint).  How is this treated?  Medicine to control blood pressure and pain.  Imaging tests to see if the aneurysm gets bigger.  Surgery. How is this prevented? To lessen your chance of getting this condition:  Stop smoking. Stop chewing tobacco.  Limit or avoid alcohol.  Keep your blood pressure, blood sugar, and cholesterol within normal limits.  Eat less salt.  Eat foods low in saturated fats and cholesterol. These are found in animal and  whole dairy products.  Eat more fiber. Fiber is found in whole grains, vegetables, and fruits.  Keep a healthy weight.  Stay active and exercise often.  This information is not intended to replace advice given to you by your health care provider. Make sure you discuss any questions you have with your health care provider. Document Released: 12/04/2012 Document Revised: 01/15/2016 Document Reviewed: 09/08/2012 Elsevier Interactive Patient Education  2017 [employer]  

## 2018-07-12 IMAGING — US US PELVIS LIMITED
1 series · 14 of 18 positions shown · non-contrast
Comparison: None

CLINICAL DATA: Right inguinal bulge, question hernia

EXAM:
LIMITED ULTRASOUND OF PELVIS
TECHNIQUE: Limited transabdominal ultrasound examination of the pelvis was
performed.

[Series 1: us pelvis limited · 0.06mm/px · 18 acquisitions, 14 frames shown]
[im 1/18]
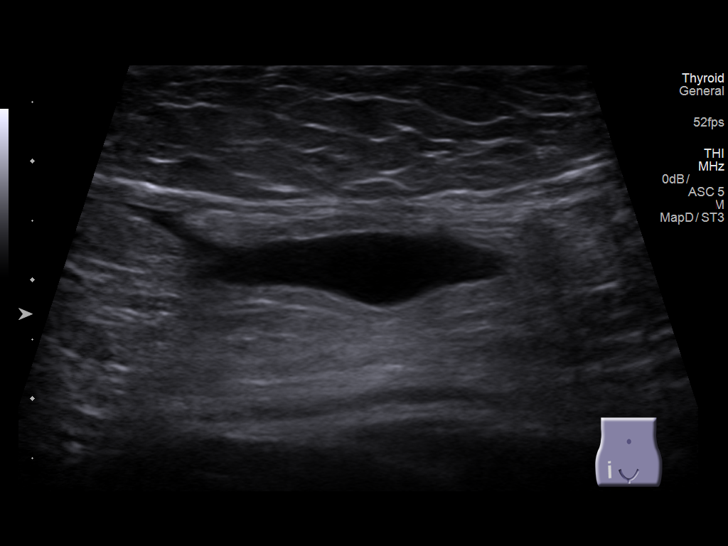
[im 2/18]
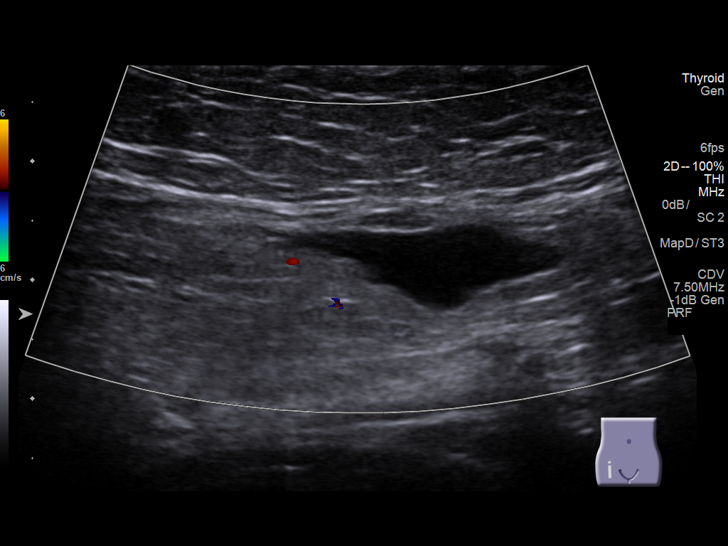
[im 4/18]
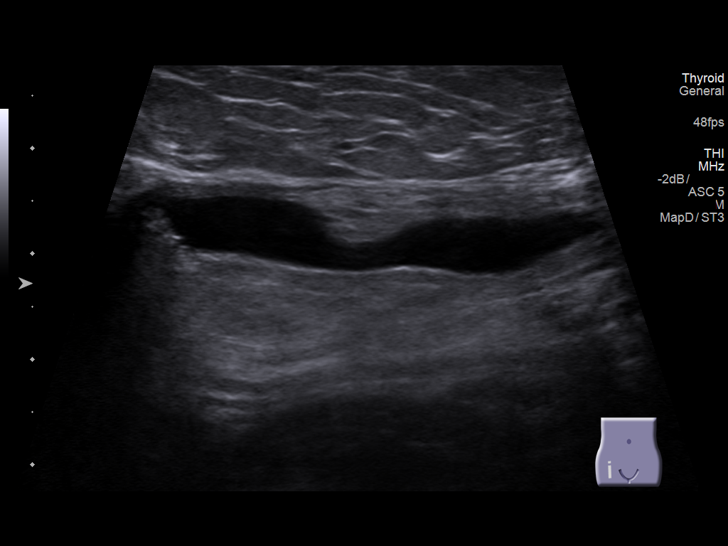
[im 5/18]
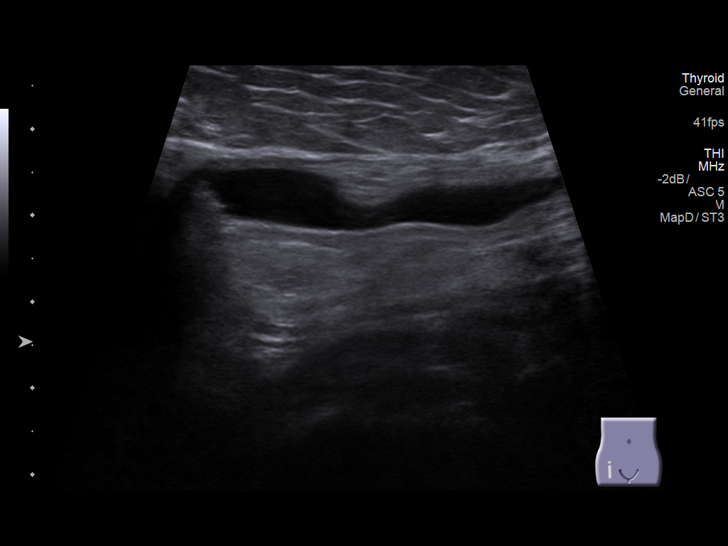
[im 6/18]
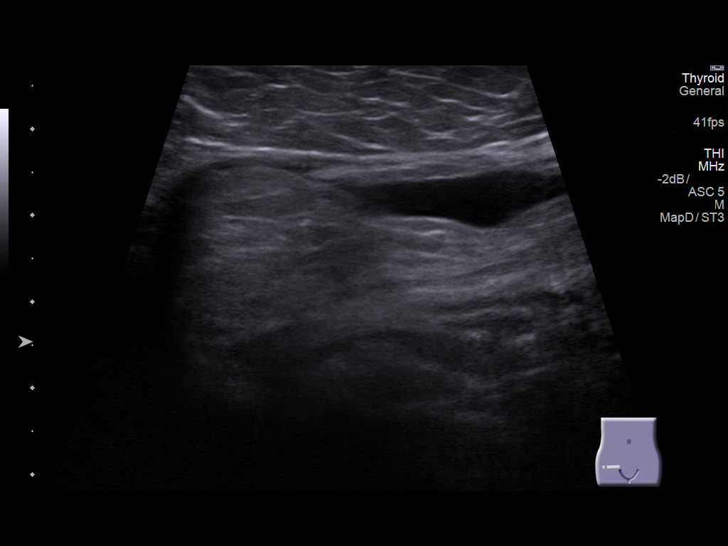
[im 8/18]
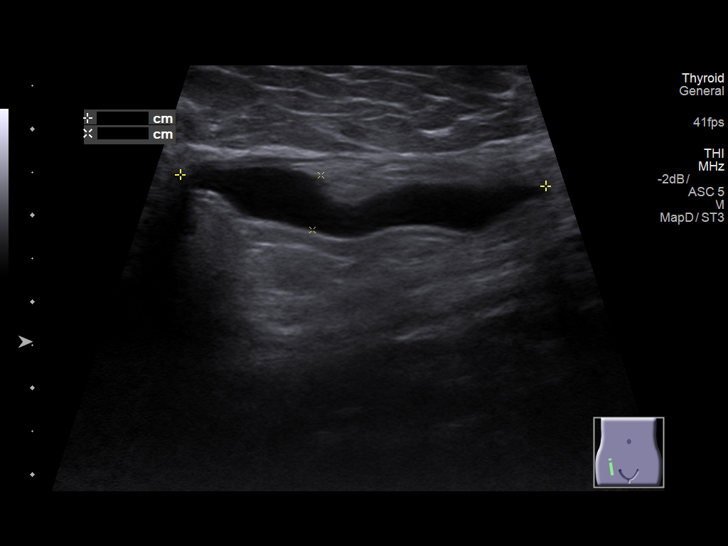
[im 9/18]
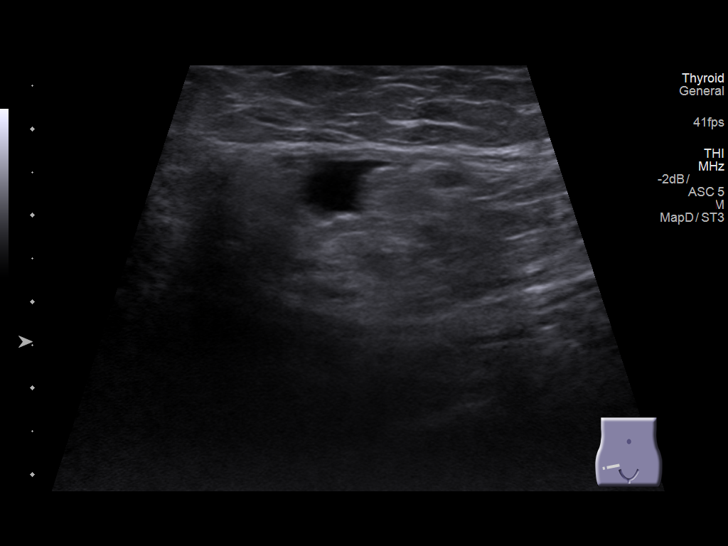
[im 10/18]
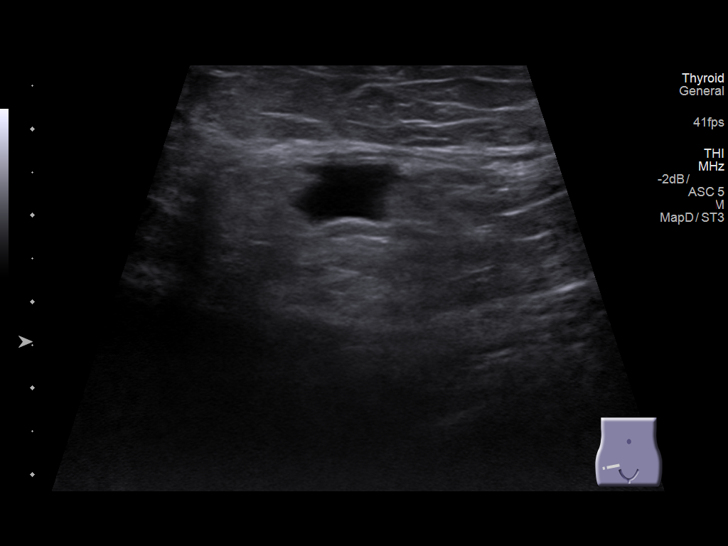
[im 11/18]
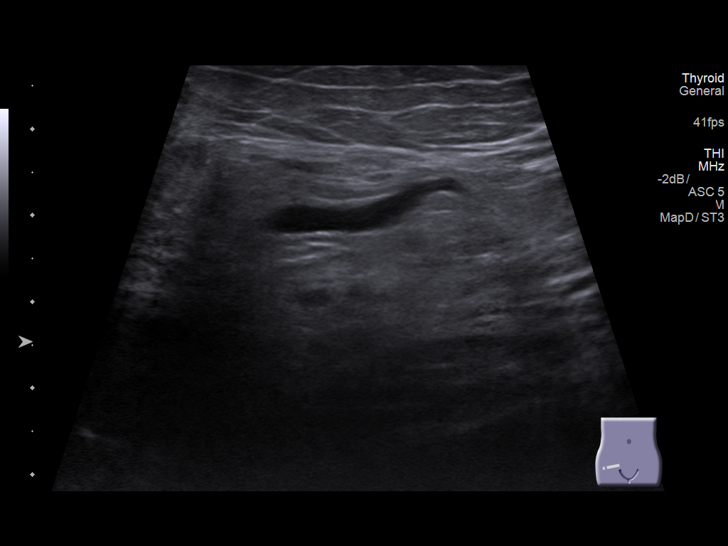
[im 13/18]
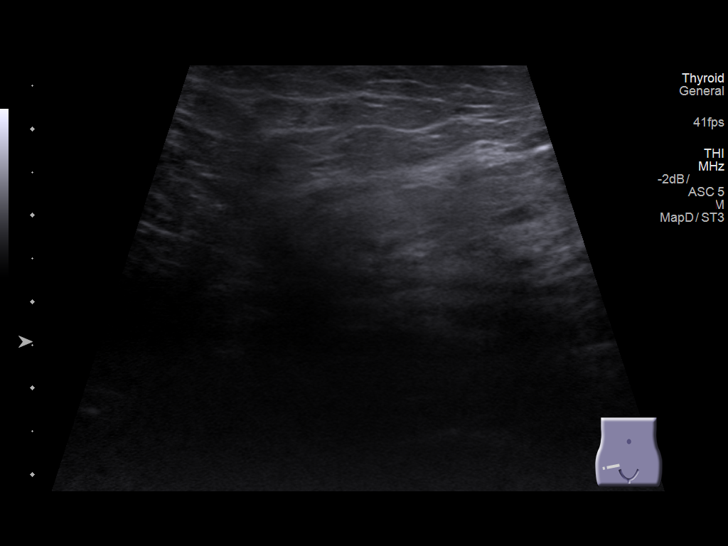
[im 14/18]
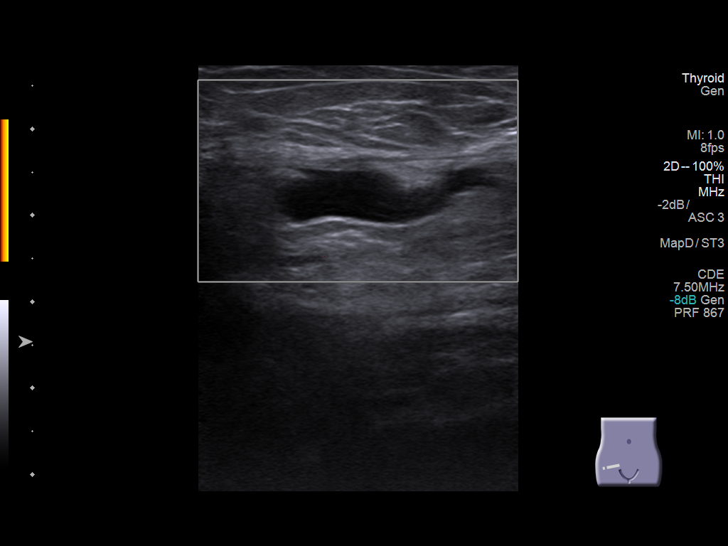
[im 15/18]
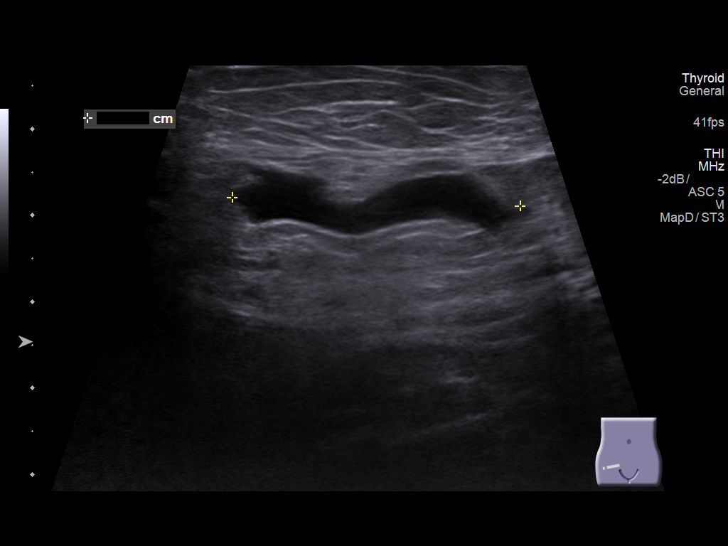
[im 17/18]
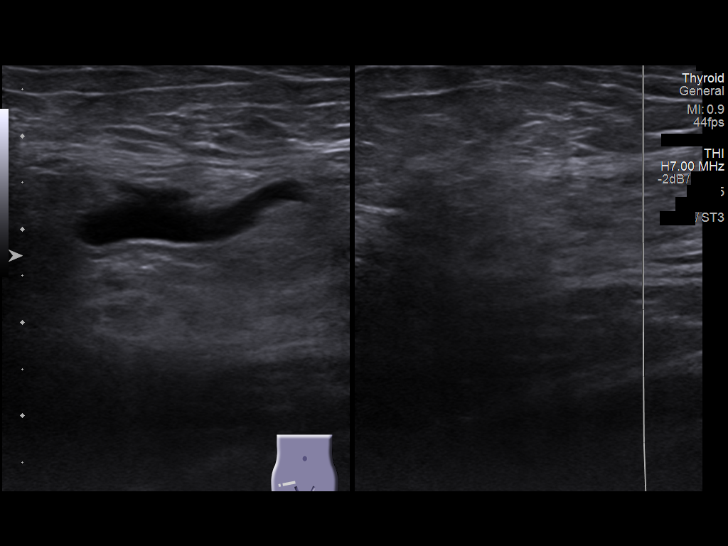
[im 18/18]
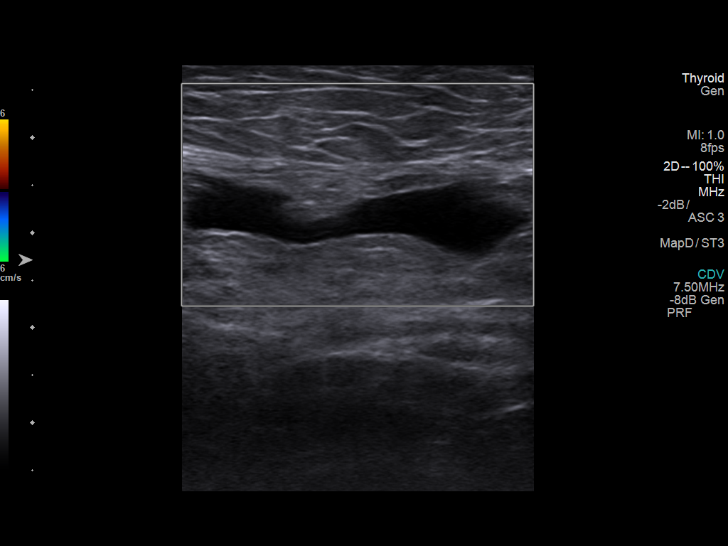

[14 of 18 positions shown; findings below may reference images not displayed]

FINDINGS: There is a fluid collection noted in the right groin measuring 4.2 x
3.3 x 0.6 cm. This is of unknown etiology. No other visible focal
soft tissue abnormality.
IMPRESSION: Elongated fluid collection in the right lower quadrant measuring
x 3.3 x 0.6 cm. This is of unknown etiology or significance.
Consider further evaluation with pelvic CT.

## 2018-07-13 ENCOUNTER — Encounter: Payer: Self-pay | Admitting: Cardiology

## 2018-07-14 ENCOUNTER — Ambulatory Visit (INDEPENDENT_AMBULATORY_CARE_PROVIDER_SITE_OTHER): Payer: Medicare Other

## 2018-07-14 DIAGNOSIS — I495 Sick sinus syndrome: Secondary | ICD-10-CM | POA: Diagnosis not present

## 2018-07-17 ENCOUNTER — Encounter: Payer: Self-pay | Admitting: Cardiology

## 2018-07-17 NOTE — Progress Notes (Signed)
Remote pacemaker transmission.   

## 2018-07-24 ENCOUNTER — Ambulatory Visit (INDEPENDENT_AMBULATORY_CARE_PROVIDER_SITE_OTHER): Payer: Medicare Other | Admitting: *Deleted

## 2018-07-24 DIAGNOSIS — Z5181 Encounter for therapeutic drug level monitoring: Secondary | ICD-10-CM

## 2018-07-24 DIAGNOSIS — I4891 Unspecified atrial fibrillation: Secondary | ICD-10-CM

## 2018-07-24 LAB — POCT INR: INR: 2.7 (ref 2.0–3.0)

## 2018-07-24 NOTE — Addendum Note (Signed)
Addended by: REID, LISA M on: 07/24/2018 10:52 AM   Modules accepted: Level of Service  

## 2018-07-24 NOTE — Patient Instructions (Signed)
Continue coumadin 1 tablet daily except 1 1/2 tablets on Thursdays Recheck in 6 weeks 

## 2018-08-06 ENCOUNTER — Other Ambulatory Visit: Payer: Self-pay | Admitting: Cardiology

## 2018-08-17 ENCOUNTER — Other Ambulatory Visit: Payer: Self-pay | Admitting: Cardiology

## 2018-09-04 ENCOUNTER — Ambulatory Visit (INDEPENDENT_AMBULATORY_CARE_PROVIDER_SITE_OTHER): Payer: Medicare Other | Admitting: Pharmacist

## 2018-09-04 DIAGNOSIS — I4891 Unspecified atrial fibrillation: Secondary | ICD-10-CM | POA: Diagnosis not present

## 2018-09-04 DIAGNOSIS — Z5181 Encounter for therapeutic drug level monitoring: Secondary | ICD-10-CM

## 2018-09-04 LAB — POCT INR: INR: 3.2 — AB (ref 2.0–3.0)

## 2018-09-04 NOTE — Patient Instructions (Signed)
Description   Take 1/2 tablet today then continue coumadin 1 tablet daily except 1 1/2 tablets on Thursdays.  Recheck in 6 weeks.

## 2018-09-08 LAB — CUP PACEART REMOTE DEVICE CHECK
Battery Remaining Longevity: 137 mo
Battery Voltage: 3.03 V
Brady Statistic AP VP Percent: 0.1 %
Brady Statistic AS VS Percent: 0.24 %
Brady Statistic RA Percent Paced: 99.74 %
Brady Statistic RV Percent Paced: 0.12 %
Date Time Interrogation Session: 20191122173704
Implantable Lead Implant Date: 20080902
Implantable Lead Implant Date: 20080902
Implantable Lead Model: 5076
Implantable Lead Model: 5076
Implantable Pulse Generator Implant Date: 20180509
Lead Channel Impedance Value: 380 Ohm
Lead Channel Impedance Value: 475 Ohm
Lead Channel Impedance Value: 532 Ohm
Lead Channel Pacing Threshold Amplitude: 0.75 V
Lead Channel Pacing Threshold Amplitude: 0.875 V
Lead Channel Sensing Intrinsic Amplitude: 1.375 mV
Lead Channel Setting Pacing Amplitude: 2 V
Lead Channel Setting Sensing Sensitivity: 0.9 mV
MDC IDC LEAD LOCATION: 753859
MDC IDC LEAD LOCATION: 753860
MDC IDC MSMT LEADCHNL RA IMPEDANCE VALUE: 361 Ohm
MDC IDC MSMT LEADCHNL RA PACING THRESHOLD PULSEWIDTH: 0.4 ms
MDC IDC MSMT LEADCHNL RA SENSING INTR AMPL: 1.375 mV
MDC IDC MSMT LEADCHNL RV PACING THRESHOLD PULSEWIDTH: 0.4 ms
MDC IDC MSMT LEADCHNL RV SENSING INTR AMPL: 21.125 mV
MDC IDC MSMT LEADCHNL RV SENSING INTR AMPL: 21.125 mV
MDC IDC SET LEADCHNL RV PACING AMPLITUDE: 2.5 V
MDC IDC SET LEADCHNL RV PACING PULSEWIDTH: 0.4 ms
MDC IDC STAT BRADY AP VS PERCENT: 99.65 %
MDC IDC STAT BRADY AS VP PERCENT: 0 %

## 2018-09-09 ENCOUNTER — Other Ambulatory Visit: Payer: Self-pay | Admitting: Cardiology

## 2018-10-09 ENCOUNTER — Ambulatory Visit (INDEPENDENT_AMBULATORY_CARE_PROVIDER_SITE_OTHER): Payer: Medicare Other | Admitting: Pharmacist

## 2018-10-09 DIAGNOSIS — Z5181 Encounter for therapeutic drug level monitoring: Secondary | ICD-10-CM

## 2018-10-09 DIAGNOSIS — I4891 Unspecified atrial fibrillation: Secondary | ICD-10-CM

## 2018-10-09 LAB — POCT INR: INR: 3 (ref 2.0–3.0)

## 2018-10-09 NOTE — Patient Instructions (Signed)
Description   Continue coumadin 1 tablet daily except 1 1/2 tablets on Thursdays.  Recheck in 6 weeks.

## 2018-10-16 ENCOUNTER — Ambulatory Visit (INDEPENDENT_AMBULATORY_CARE_PROVIDER_SITE_OTHER): Payer: Medicare Other | Admitting: *Deleted

## 2018-10-16 DIAGNOSIS — R001 Bradycardia, unspecified: Secondary | ICD-10-CM | POA: Diagnosis not present

## 2018-10-16 DIAGNOSIS — I4891 Unspecified atrial fibrillation: Secondary | ICD-10-CM

## 2018-10-17 LAB — CUP PACEART REMOTE DEVICE CHECK
Battery Voltage: 3.02 V
Brady Statistic AP VP Percent: 0.09 %
Brady Statistic AS VP Percent: 0 %
Brady Statistic AS VS Percent: 0.3 %
Implantable Lead Implant Date: 20080902
Implantable Lead Implant Date: 20080902
Implantable Lead Model: 5076
Lead Channel Impedance Value: 361 Ohm
Lead Channel Impedance Value: 380 Ohm
Lead Channel Impedance Value: 513 Ohm
Lead Channel Pacing Threshold Amplitude: 0.75 V
Lead Channel Pacing Threshold Amplitude: 0.875 V
Lead Channel Pacing Threshold Pulse Width: 0.4 ms
Lead Channel Setting Pacing Amplitude: 2 V
MDC IDC LEAD LOCATION: 753859
MDC IDC LEAD LOCATION: 753860
MDC IDC MSMT BATTERY REMAINING LONGEVITY: 134 mo
MDC IDC MSMT LEADCHNL RA PACING THRESHOLD PULSEWIDTH: 0.4 ms
MDC IDC MSMT LEADCHNL RA SENSING INTR AMPL: 1.375 mV
MDC IDC MSMT LEADCHNL RA SENSING INTR AMPL: 1.375 mV
MDC IDC MSMT LEADCHNL RV IMPEDANCE VALUE: 456 Ohm
MDC IDC MSMT LEADCHNL RV SENSING INTR AMPL: 21.875 mV
MDC IDC MSMT LEADCHNL RV SENSING INTR AMPL: 21.875 mV
MDC IDC PG IMPLANT DT: 20180509
MDC IDC SESS DTM: 20200224141007
MDC IDC SET LEADCHNL RV PACING AMPLITUDE: 2.5 V
MDC IDC SET LEADCHNL RV PACING PULSEWIDTH: 0.4 ms
MDC IDC SET LEADCHNL RV SENSING SENSITIVITY: 0.9 mV
MDC IDC STAT BRADY AP VS PERCENT: 99.62 %
MDC IDC STAT BRADY RA PERCENT PACED: 99.67 %
MDC IDC STAT BRADY RV PERCENT PACED: 0.13 %

## 2018-10-24 NOTE — Progress Notes (Signed)
Remote pacemaker transmission.   

## 2018-10-26 ENCOUNTER — Ambulatory Visit (INDEPENDENT_AMBULATORY_CARE_PROVIDER_SITE_OTHER): Payer: Medicare Other | Admitting: Otolaryngology

## 2018-10-26 DIAGNOSIS — H903 Sensorineural hearing loss, bilateral: Secondary | ICD-10-CM | POA: Diagnosis not present

## 2018-10-26 DIAGNOSIS — H6123 Impacted cerumen, bilateral: Secondary | ICD-10-CM | POA: Diagnosis not present

## 2018-11-21 ENCOUNTER — Other Ambulatory Visit: Payer: Self-pay | Admitting: Cardiology

## 2018-11-21 IMAGING — CT CT ABD-PEL WO/W CM
3 of 11 series · 11 of 46 positions shown, 17 images · IV contrast (iopamidol)
Comparison: CT AP 11/22/2017 in CT chest 11/25/2011

CLINICAL DATA: Right-sided groin discomfort for several months.
Previous bilateral inguinal hernia repair.

EXAM:
CT ABDOMEN AND PELVIS WITHOUT AND WITH CONTRAST
TECHNIQUE: Multidetector CT imaging of the abdomen and pelvis was performed
following the standard protocol before and following the bolus
administration of intravenous contrast.
CONTRAST:  100mL HOF5O3-PNN IOPAMIDOL (HOF5O3-PNN) INJECTION 61%

[Series 3: axial st · axial · 0.70mm/px · z∈[+997,+1317]mm · 7 of 86 slices shown, 12 images (1 of 2)]
[im 11/86  soft-tissue]
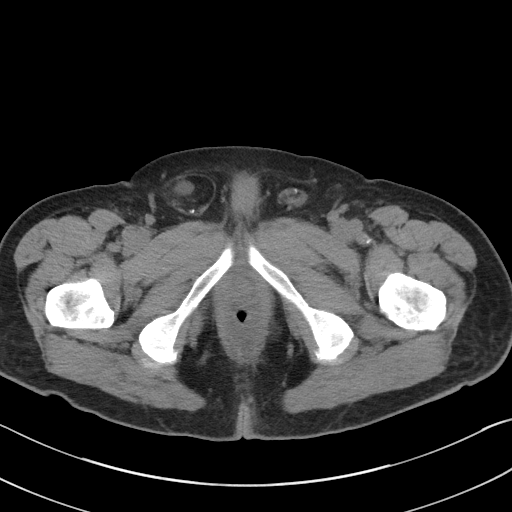
[im 11/86  bone]
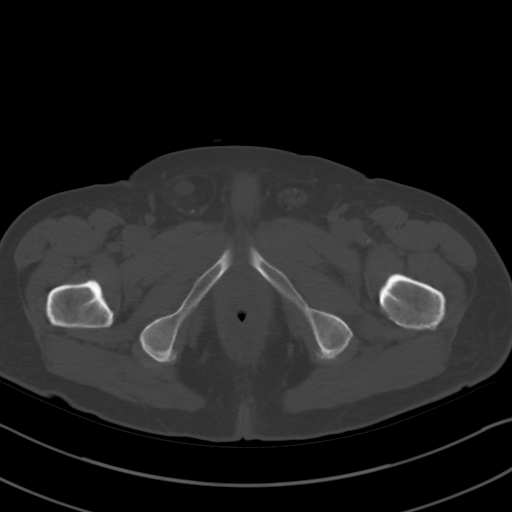
[im 22/86  soft-tissue]
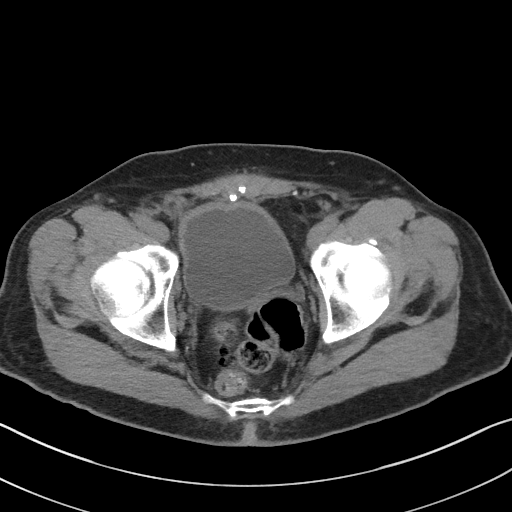
[im 32/86  soft-tissue]
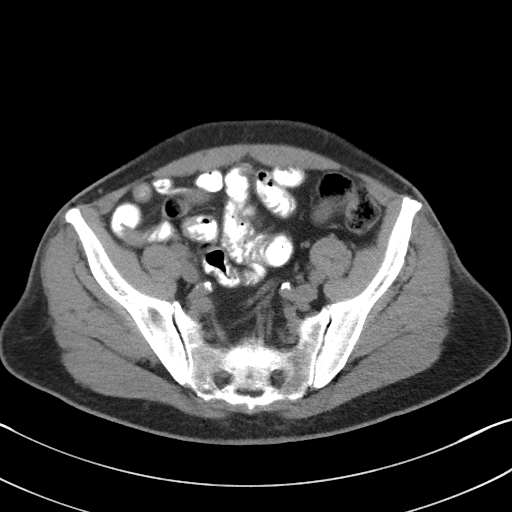
[im 43/86  soft-tissue]
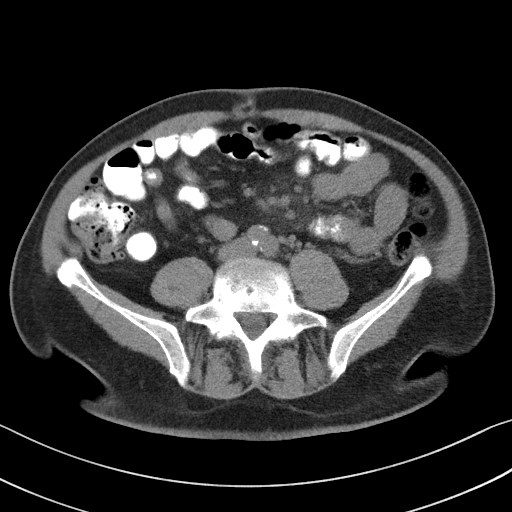
[im 43/86  lung]
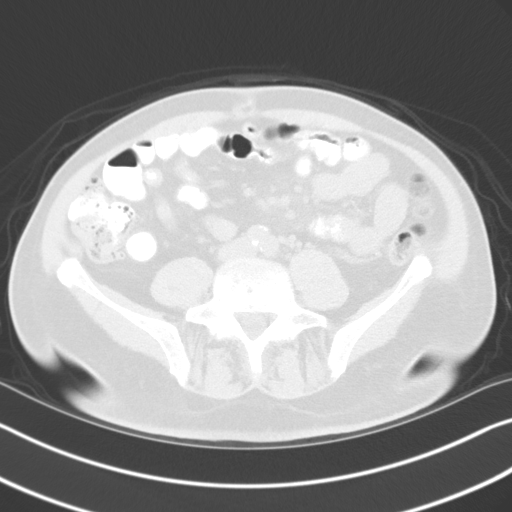
[im 54/86  soft-tissue]
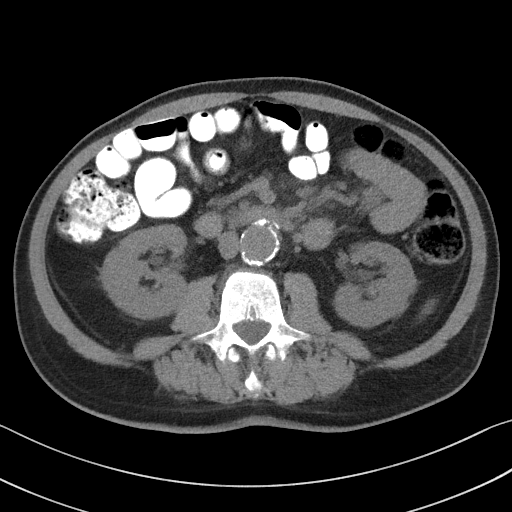
[im 54/86  lung]
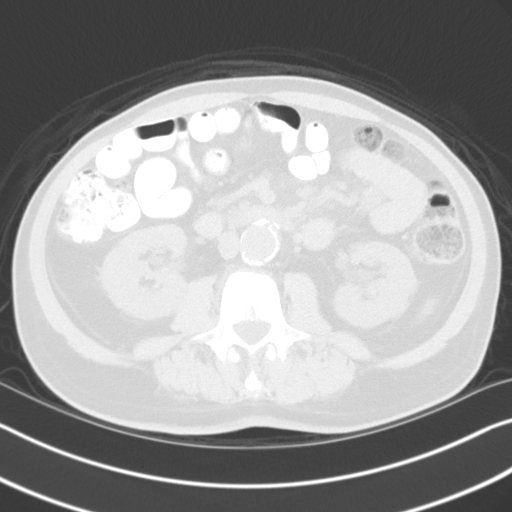
[im 64/86  soft-tissue]
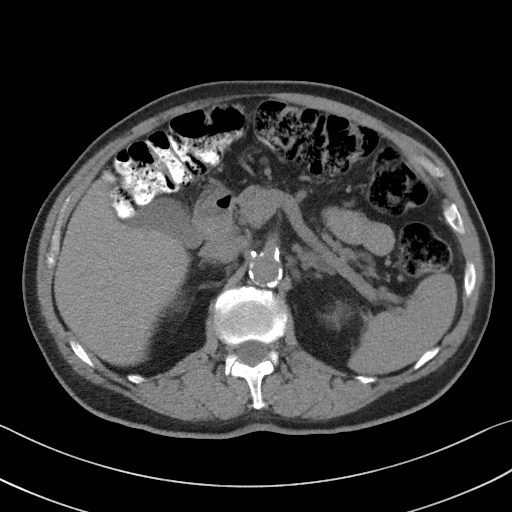
[im 64/86  lung]
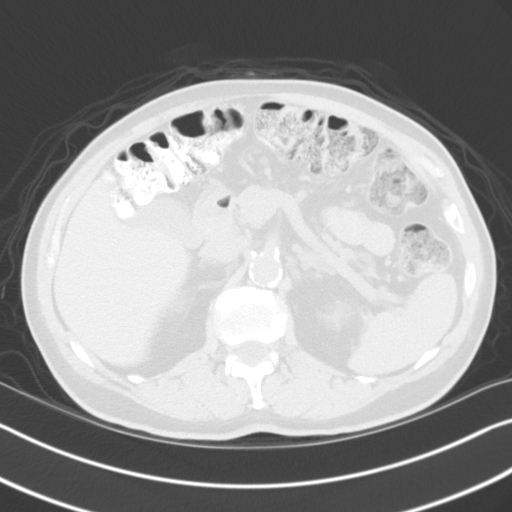
[im 75/86  soft-tissue]
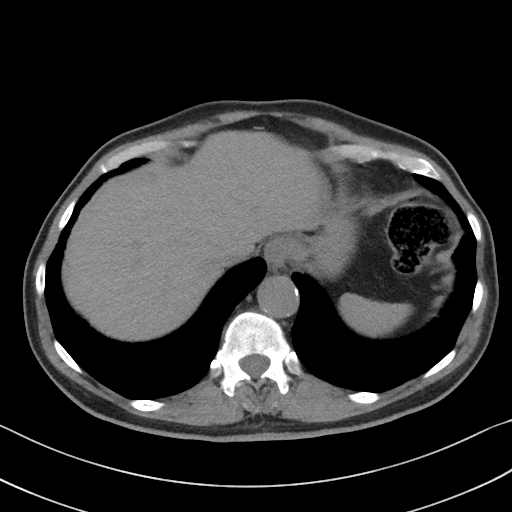
[im 75/86  lung]
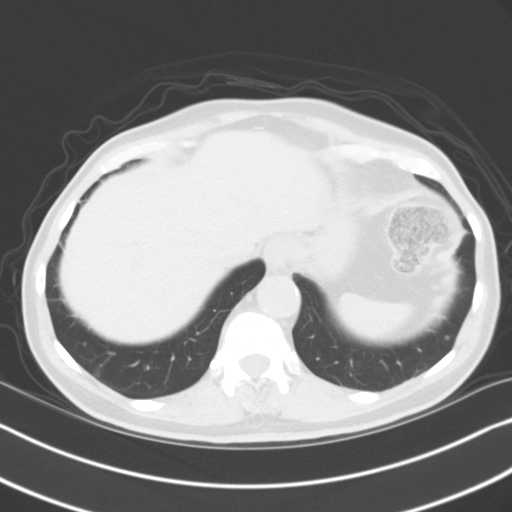

[Series 4: axial st · axial · 0.69mm/px · z∈[+1012,+1062]mm · 2 of 83 slices shown (2 of 2)]
[im 11/83  soft-tissue]
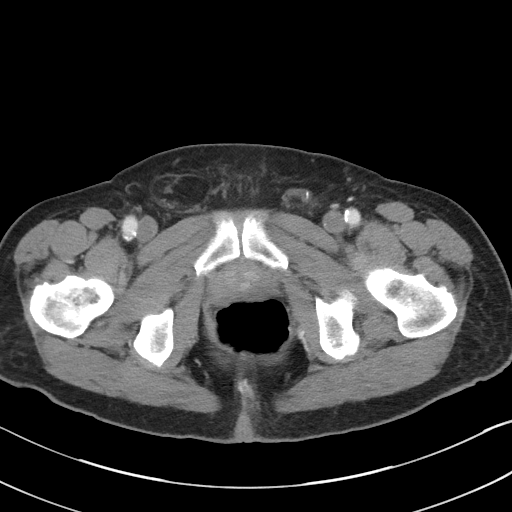
[im 21/83  soft-tissue]
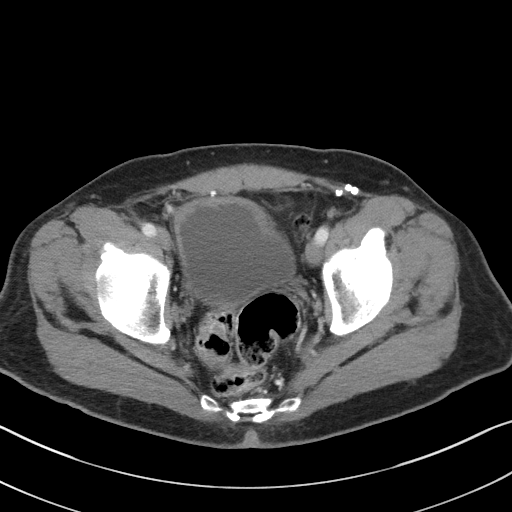

[Series 9: coronal st · coronal · 0.82mm/px · 2 of 101 slices shown, 3 images]
[im 34/101  soft-tissue]
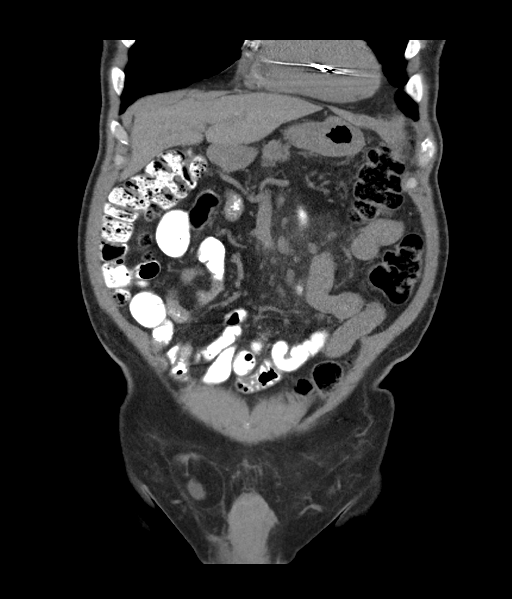
[im 34/101  bone]
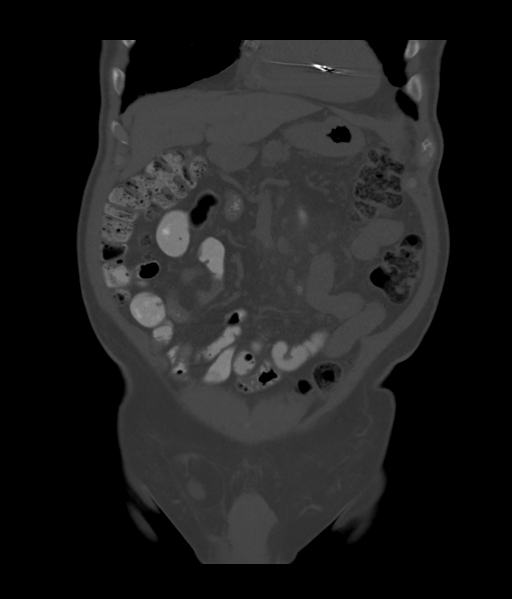
[im 67/101  soft-tissue]
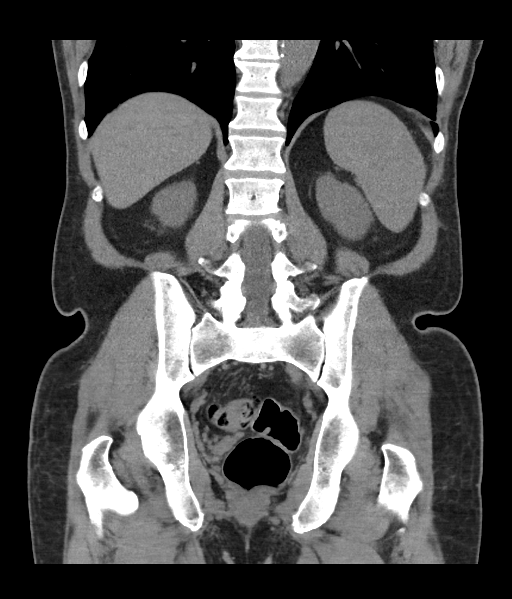

[11 of 46 positions shown; findings below may reference images not displayed]

FINDINGS: Lower chest: No acute abnormality.

Hepatobiliary: 9 mm low-density structure in left lobe of liver is
too small to reliably characterize. Unchanged enhancing lesion
within the posterolateral right lobe of liver compared with
12/07/2010. Previously characterized as a benign vascular
abnormality. Gallbladder normal.

Pancreas: Along the neck of pancreas there is a 1 cm low-density
cystic structure, image [DATE]. New from 12/05/2011. No pancreatic
inflammation or main duct dilatation.

Spleen: Normal in size without focal abnormality.

Adrenals/Urinary Tract: Normal appearance of the right adrenal
gland. Low-density left adrenal nodule measures 11 mm and -4 HU on
precontrast studies compatible with a benign adenoma. No kidney
stones or hydronephrosis identified. Cortically based cyst within
the interpolar right kidney measures 2.8 cm. Mild ventral bladder
wall thickening noted.

Stomach/Bowel: The stomach is normal. The small bowel loops have a
normal course and caliber. The appendix is visualized and appears
normal. No pathologic dilatation of the colon.

Vascular/Lymphatic: Aortic atherosclerosis. The infrarenal abdominal
aorta measures 3 cm in maximum AP dimension. Mild soft tissue
stranding within the small bowel mesentery is again noted. This is
been present dating back to 12/07/2010 and is favored to represent a
benign abnormality. Multiple prominent mesenteric nodes are
identified but no adenopathy. No retroperitoneal adenopathy. No
pelvic or inguinal adenopathy.

Reproductive: Prostate gland Measures 5.4 by 3.1 by 4.2 cm (volume =
37 cm^3), image 64/12.

Other: Postoperative change from bilateral inguinal herniorrhaphy
noted. There is been interval recurrence of right inguinal hernia
which contains fat only, image 35/13.

Musculoskeletal: No suspicious bone lesions. Spondylosis noted
within the lumbar spine.
IMPRESSION: 1. Interval recurrence of fat containing right inguinal hernia
status post bilateral herniorrhaphy
2. Low-density, cystic lesion within the neck of pancreas is new
from previous studies measuring 1 cm. More definitive assessment
with pancreas protocol MRI is advised. This recommendation follows
ACR consensus guidelines: Management of Incidental Pancreatic Cysts:
A White Paper of the ACR Incidental Findings Committee. [HOSPITAL] 2207;[DATE].
3. Aortic aneurysm NOS (J3FK7-[facility].U). Recommend followup by US in 3
years. This recommendation follows ACR consensus guidelines: White
Paper of the ACR Incidental Findings Committee II on Vascular
Findings. [HOSPITAL] 0044; [DATE]
4.  Aortic Atherosclerosis (J3FK7-03A.A).

## 2018-12-12 ENCOUNTER — Encounter: Payer: Self-pay | Admitting: General Surgery

## 2018-12-12 ENCOUNTER — Ambulatory Visit (INDEPENDENT_AMBULATORY_CARE_PROVIDER_SITE_OTHER): Payer: Medicare Other | Admitting: General Surgery

## 2018-12-12 ENCOUNTER — Encounter (HOSPITAL_COMMUNITY): Payer: Self-pay | Admitting: *Deleted

## 2018-12-12 ENCOUNTER — Inpatient Hospital Stay (HOSPITAL_COMMUNITY)
Admission: AD | Admit: 2018-12-12 | Discharge: 2018-12-13 | DRG: 352 | Disposition: A | Discharge status: Home / Self Care | Payer: Medicare Other | Source: Ambulatory Visit | Attending: General Surgery | Admitting: General Surgery

## 2018-12-12 ENCOUNTER — Other Ambulatory Visit: Payer: Self-pay

## 2018-12-12 VITALS — BP 156/80 | HR 76 | Temp 98.9°F | Resp 16 | Wt 147.0 lb

## 2018-12-12 DIAGNOSIS — E7800 Pure hypercholesterolemia, unspecified: Secondary | ICD-10-CM | POA: Diagnosis present

## 2018-12-12 DIAGNOSIS — Z7901 Long term (current) use of anticoagulants: Secondary | ICD-10-CM

## 2018-12-12 DIAGNOSIS — I4891 Unspecified atrial fibrillation: Secondary | ICD-10-CM | POA: Diagnosis present

## 2018-12-12 DIAGNOSIS — E119 Type 2 diabetes mellitus without complications: Secondary | ICD-10-CM | POA: Diagnosis present

## 2018-12-12 DIAGNOSIS — I10 Essential (primary) hypertension: Secondary | ICD-10-CM | POA: Diagnosis present

## 2018-12-12 DIAGNOSIS — Z8249 Family history of ischemic heart disease and other diseases of the circulatory system: Secondary | ICD-10-CM

## 2018-12-12 DIAGNOSIS — I714 Abdominal aortic aneurysm, without rupture: Secondary | ICD-10-CM | POA: Diagnosis present

## 2018-12-12 DIAGNOSIS — K4031 Unilateral inguinal hernia, with obstruction, without gangrene, recurrent: Secondary | ICD-10-CM | POA: Diagnosis present

## 2018-12-12 DIAGNOSIS — Z8349 Family history of other endocrine, nutritional and metabolic diseases: Secondary | ICD-10-CM | POA: Diagnosis not present

## 2018-12-12 DIAGNOSIS — Z823 Family history of stroke: Secondary | ICD-10-CM | POA: Diagnosis not present

## 2018-12-12 DIAGNOSIS — Z79890 Hormone replacement therapy (postmenopausal): Secondary | ICD-10-CM

## 2018-12-12 DIAGNOSIS — G4733 Obstructive sleep apnea (adult) (pediatric): Secondary | ICD-10-CM | POA: Diagnosis present

## 2018-12-12 DIAGNOSIS — Z809 Family history of malignant neoplasm, unspecified: Secondary | ICD-10-CM

## 2018-12-12 DIAGNOSIS — Z833 Family history of diabetes mellitus: Secondary | ICD-10-CM | POA: Diagnosis not present

## 2018-12-12 DIAGNOSIS — Z888 Allergy status to other drugs, medicaments and biological substances status: Secondary | ICD-10-CM

## 2018-12-12 DIAGNOSIS — Z955 Presence of coronary angioplasty implant and graft: Secondary | ICD-10-CM

## 2018-12-12 DIAGNOSIS — I2510 Atherosclerotic heart disease of native coronary artery without angina pectoris: Secondary | ICD-10-CM | POA: Diagnosis present

## 2018-12-12 DIAGNOSIS — Z01818 Encounter for other preprocedural examination: Secondary | ICD-10-CM

## 2018-12-12 DIAGNOSIS — K4091 Unilateral inguinal hernia, without obstruction or gangrene, recurrent: Secondary | ICD-10-CM | POA: Diagnosis not present

## 2018-12-12 DIAGNOSIS — K4030 Unilateral inguinal hernia, with obstruction, without gangrene, not specified as recurrent: Secondary | ICD-10-CM | POA: Diagnosis present

## 2018-12-12 DIAGNOSIS — Z950 Presence of cardiac pacemaker: Secondary | ICD-10-CM

## 2018-12-12 LAB — CBC
HCT: 41.4 % (ref 39.0–52.0)
Hemoglobin: 13.5 g/dL (ref 13.0–17.0)
MCH: 29.3 pg (ref 26.0–34.0)
MCHC: 32.6 g/dL (ref 30.0–36.0)
MCV: 89.8 fL (ref 80.0–100.0)
Platelets: 164 10*3/uL (ref 150–400)
RBC: 4.61 MIL/uL (ref 4.22–5.81)
RDW: 15.4 % (ref 11.5–15.5)
WBC: 5.6 10*3/uL (ref 4.0–10.5)
nRBC: 0 % (ref 0.0–0.2)

## 2018-12-12 LAB — BASIC METABOLIC PANEL
Anion gap: 8 (ref 5–15)
BUN: 14 mg/dL (ref 8–23)
CO2: 27 mmol/L (ref 22–32)
Calcium: 9.1 mg/dL (ref 8.9–10.3)
Chloride: 101 mmol/L (ref 98–111)
Creatinine, Ser: 0.81 mg/dL (ref 0.61–1.24)
GFR calc Af Amer: >60 mL/min (ref >60)
GFR calc non Af Amer: >60 mL/min (ref >60)
Glucose, Bld: 100 mg/dL — ABNORMAL HIGH (ref 70–99)
Potassium: 4.2 mmol/L (ref 3.5–5.1)
Sodium: 136 mmol/L (ref 135–145)

## 2018-12-12 LAB — SURGICAL PCR SCREEN
MRSA, PCR: NEGATIVE
Staphylococcus aureus: NEGATIVE

## 2018-12-12 LAB — PROTIME-INR
INR: 1.5 — ABNORMAL HIGH (ref 0.8–1.2)
Prothrombin Time: 18.1 seconds — ABNORMAL HIGH (ref 11.4–15.2)

## 2018-12-12 MED ORDER — ACETAMINOPHEN 325 MG PO TABS: 650.0000 mg | ORAL_TABLET | Freq: Four times a day (QID) | ORAL | Status: DC | PRN

## 2018-12-12 MED ORDER — SOTALOL HCL 80 MG PO TABS: 160.0000 mg | ORAL_TABLET | Freq: Every day | ORAL | Status: DC

## 2018-12-12 MED ORDER — MORPHINE SULFATE (PF) 2 MG/ML IV SOLN: 2.0000 mg | INTRAVENOUS | Status: DC | PRN

## 2018-12-12 MED ORDER — SODIUM CHLORIDE 0.9 % IV SOLN
2.0000 g | Freq: Two times a day (BID) | INTRAVENOUS | Status: DC
  Administered 2018-12-12 – 2018-12-13 (×2): 2 g via INTRAVENOUS
  Filled 2018-12-12 (×4): qty 2

## 2018-12-12 MED ORDER — ONDANSETRON 4 MG PO TBDP: 4.0000 mg | ORAL_TABLET | Freq: Four times a day (QID) | ORAL | Status: DC | PRN

## 2018-12-12 MED ORDER — SIMETHICONE 80 MG PO CHEW: 40.0000 mg | CHEWABLE_TABLET | Freq: Four times a day (QID) | ORAL | Status: DC | PRN

## 2018-12-12 MED ORDER — DILTIAZEM HCL ER COATED BEADS 240 MG PO CP24
240.0000 mg | ORAL_CAPSULE | Freq: Every day | ORAL | Status: DC
  Administered 2018-12-12: 240 mg via ORAL
  Filled 2018-12-12: qty 1

## 2018-12-12 MED ORDER — SOTALOL HCL 80 MG PO TABS
160.0000 mg | ORAL_TABLET | Freq: Two times a day (BID) | ORAL | Status: DC
  Administered 2018-12-12: 21:00:00 160 mg via ORAL
  Filled 2018-12-12 (×2): qty 2

## 2018-12-12 MED ORDER — HYDROCODONE-ACETAMINOPHEN 5-325 MG PO TABS: 1.0000 | ORAL_TABLET | ORAL | Status: DC | PRN

## 2018-12-12 MED ORDER — LEVOTHYROXINE SODIUM 75 MCG PO TABS
75.0000 ug | ORAL_TABLET | Freq: Every day | ORAL | Status: DC
  Administered 2018-12-13: 05:00:00 75 ug via ORAL
  Filled 2018-12-12: qty 1

## 2018-12-12 MED ORDER — ONDANSETRON HCL 4 MG/2ML IJ SOLN: 4.0000 mg | Freq: Four times a day (QID) | INTRAMUSCULAR | Status: DC | PRN

## 2018-12-12 MED ORDER — SODIUM CHLORIDE 0.9 % IV SOLN
INTRAVENOUS | Status: DC
  Administered 2018-12-12: 16:00:00 via INTRAVENOUS

## 2018-12-12 MED ORDER — LOSARTAN POTASSIUM 50 MG PO TABS
50.0000 mg | ORAL_TABLET | Freq: Every day | ORAL | Status: DC
  Filled 2018-12-12: qty 1

## 2018-12-12 MED ORDER — ACETAMINOPHEN 650 MG RE SUPP: 650.0000 mg | Freq: Four times a day (QID) | RECTAL | Status: DC | PRN

## 2018-12-12 NOTE — H&P (Addendum)
Johnny Reynolds; [id number]; 03/23/1941   HPI Patient is a 78 year old white male who was referred to my care by Dr. Hilma Favors for evaluation treatment of a right inguinal hernia.  I last saw the patient in my office in April 2019.  At that time, the hernia was easily reducible.  He states he started having right groin pain and swelling 4 days ago.  He was lifting something heavy in a wheelbarrow and he started having more protuberance in the right groin region.  The hernia has remained out.  He states he is having normal bowel movements.  He denies any nausea or vomiting.  He has minimal pain in the right groin region.  His history is significant for atrial fibrillation with a pacemaker in place and he is on Coumadin.  He stopped taking his Coumadin 3 days ago.  He currently has a pain level of 2 out of 10. Past Medical History:  Diagnosis Date  . AAA (abdominal aortic aneurysm) ([place])   . Arrhythmia   . Atrial fibrillation ([place])   . Back pain, chronic   . Coronary artery disease 1996 and 1997   angioplasty of LAD  . 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 High cholesterol   . Hypertension   . Left shoulder pain 2014  . Sleep apnea Sept. 2008   USES C-PAP    Past Surgical History:  Procedure Laterality Date  . CARDIAC CATHETERIZATION  1996  . CARDIAC CATHETERIZATION  06/18/1996   mid-circ tandem overlying stents with 20%,70%mid-LAD naroowin stent site in circ with 70% stenosis in the mid to distal marginal branch,tandem 95% and 80% stenosis in the proximal portion of the posterior descending branch of RCA   . CATARACT EXTRACTION  1983  . COLONOSCOPY N/A 09/04/2014   Procedure: COLONOSCOPY;  Surgeon: [name], MD;  [facility];  Service: Endoscopy;  Laterality: N/A;  1030  . CORONARY ANGIOPLASTY WITH STENT PLACEMENT  08/18/1995   PTA/STENT CIRC  . CORONARY ANGIOPLASTY WITH STENT PLACEMENT  06/18/1996   PTCA  posterior descending  branch of the RCA  95 AND 80% TO LESSTHAN 20%  . [place]  . DOPPLER ECHOCARDIOGRAPHY  04/17/2010   EF =50-55%; mild concentric LVH,LAE, AORTIC SCLEROSIS.TRACE TO MILD CENTRAL AI, TRACE MITRAL REGURGITATION, CANNOT ASSESS DIASTOLIC FUNCTIONDUE TO UNDERLYING RHYTHM,PACERMAKER WIRE IN THE RA/RV  . DOPPLER ECHOCARDIOGRAPHY  04/21/2007   DONE IN [place]-- MILD MITRAL REGURG., MILD TRICUSPID REGURG. WITH MILD PULM. HTN, MILD CONCENTRIC LEFT VENTRICULAR HYPERTROPHY,MILD LEFT ATRIAL ENLARGEMENT.  . EYE SURGERY  1983   Cataract  . HERNIA REPAIR Bilateral 2000  . INSERT / REPLACE / REMOVE PACEMAKER  SEPT 2008   INSERT DUAL -CHAMBER-medtronic adapta ADDR01 serial   [id number]  . NM MYOCAR MULTIPLE W/SPECT  04/17/2010   EF 50%; LOW NORMAL LV FUNCTION,  . PACEMAKER INSERTION  2008   DUAL-CHAMBER  . PPM GENERATOR CHANGEOUT N/A 12/29/2016   Procedure: PPM Generator Changeout;  Surgeon: Evans Lance, MD;  Location: [facility];  Service: Cardiovascular;  Laterality: N/A;    Family History  Problem Relation Age of Onset  . Peripheral vascular disease Mother   . Hypertension Mother   . Diabetes Mother   . Heart disease Mother   . Hyperlipidemia Mother   . Peripheral vascular disease Sister   . Cancer Sister   . Diabetes Sister   . Heart disease Sister  Stent- Heart Disease before age 1  . Hyperlipidemia Sister   . Hypertension Sister   . Peripheral vascular disease Brother   . Diabetes Brother   . Heart disease Brother        CHF and Heart Disease before age 11  . Hyperlipidemia Brother   . Hypertension Brother   . Stroke Father 60       TIA    Current Outpatient Medications on File Prior to Visit  Medication Sig Dispense Refill  . atorvastatin (LIPITOR) 80 MG tablet TAKE ONE TABLET BY MOUTH DAILY. (Patient taking differently: Take 40 mg by mouth. Takes half) 90 tablet 3  . diltiazem (CARDIZEM CD) 240 MG 24 hr capsule Take 240 mg by mouth at bedtime.     Marland Kitchen levothyroxine  (SYNTHROID, LEVOTHROID) 75 MCG tablet Take 75 mcg by mouth daily before breakfast.    . losartan (COZAAR) 50 MG tablet TAKE 1 TABLET BY MOUTH ONCE DAILY. 90 tablet 3  . saw palmetto 500 MG capsule Take 500 mg by mouth daily.     . sotalol (BETAPACE) 160 MG tablet TAKE (1) TABLET BY MOUTH TWICE DAILY. 180 tablet 3  . warfarin (COUMADIN) 5 MG tablet TAKE 1 TABLET BY MOUTH DAILY EXCEPT ON THURSDAYS TAKE 1 AND 1/2 TABLETS. 100 tablet 1  . atorvastatin (LIPITOR) 40 MG tablet Take 40 mg by mouth daily.     No current facility-administered medications on file prior to visit.     Allergies  Allergen Reactions  . Fexofenadine Hcl Palpitations    Allegra    Social History   Substance and Sexual Activity  Alcohol Use No  . Alcohol/week: 0.0 standard drinks    Social History   Tobacco Use  Smoking Status Never Smoker  Smokeless Tobacco Never Used    Review of Systems  Constitutional: Negative.   HENT: Negative.   Eyes: Negative.   Respiratory: Negative.   Cardiovascular: Negative.   Gastrointestinal: Negative for abdominal pain, heartburn, nausea and vomiting.  Genitourinary: Negative.   Musculoskeletal: Negative.   Skin: Negative.   Neurological: Negative.   Endo/Heme/Allergies: Bruises/bleeds easily.  Psychiatric/Behavioral: Negative.     Objective   Vitals:   12/12/18 1002  BP: (!) 156/80  Pulse: 76  Resp: 16  Temp: 98.9 F (37.2 C)  SpO2: 96%    Physical Exam Constitutional:      Appearance: Normal appearance. He is not ill-appearing.  HENT:     Head: Normocephalic and atraumatic.  Cardiovascular:     Rate and Rhythm: Normal rate and regular rhythm.     Heart sounds: Normal heart sounds. No murmur. No friction rub. No gallop.   Pulmonary:     Effort: Pulmonary effort is normal. No respiratory distress.     Breath sounds: Normal breath sounds. No stridor. No wheezing, rhonchi or rales.  Abdominal:     General: Abdomen is flat. Bowel sounds are normal. There  is no distension.     Palpations: Abdomen is soft. There is no mass.     Tenderness: There is no abdominal tenderness. There is no guarding or rebound.     Hernia: A hernia is present.     Comments: Moderate sized right inguinal hernia that could not be reduced.  No erythema noted.  Skin:    General: Skin is warm and dry.  Neurological:     Mental Status: He is alert and oriented to person, place, and time.     Assessment  Incarcerated right inguinal hernia,  chronically anticoagulated with Coumadin, pacemaker in place Plan   Patient will be admitted to the hospital today.  We will check his INR.  Will get cardiology consultation.  Plan to perform right inguinal herniorrhaphy with mesh on 12/13/2018.  The risks and benefits of the procedure including bleeding, infection, cardiopulmonary difficulties, stroke, the possibility of a bowel resection were fully explained to the patient, who gave informed consent. Discussed with Cardiology.  No bridging needed.  Will restart coumadin after surgery.  Otherwise stable for urgent surgical intervention.

## 2018-12-12 NOTE — Progress Notes (Signed)
Johnny Reynolds; [id number]; [birth year]   HPI Patient is a 78 year old white male who was referred to my care by Dr. Hilma Favors for evaluation treatment of a right inguinal hernia.  I last saw the patient in my office in April 2019.  At that time, the hernia was easily reducible.  He states he started having right groin pain and swelling 4 days ago.  He was lifting something heavy in a wheelbarrow and he started having more protuberance in the right groin region.  The hernia has remained out.  He states he is having normal bowel movements.  He denies any nausea or vomiting.  He has minimal pain in the right groin region.  His history is significant for atrial fibrillation with a pacemaker in place and he is on Coumadin.  He stopped taking his Coumadin 3 days ago.  He currently has a pain level of 2 out of 10. Past Medical History:  Diagnosis Date  . AAA (abdominal aortic aneurysm) ([place])   . Arrhythmia   . Atrial fibrillation ([place])   . Back pain, chronic   . Coronary artery disease 1996 and 1997   angioplasty of LAD  . 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 High cholesterol   . Hypertension   . Left shoulder pain 2014  . Sleep apnea Sept. 2008   USES C-PAP    Past Surgical History:  Procedure Laterality Date  . CARDIAC CATHETERIZATION  1996  . CARDIAC CATHETERIZATION  06/18/1996   mid-circ tandem overlying stents with 20%,70%mid-LAD naroowin stent site in circ with 70% stenosis in the mid to distal marginal branch,tandem 95% and 80% stenosis in the proximal portion of the posterior descending branch of RCA   . CATARACT EXTRACTION  1983  . COLONOSCOPY N/A 09/04/2014   Procedure: COLONOSCOPY;  Surgeon: [name], MD;  [facility];  Service: Endoscopy;  Laterality: N/A;  1030  . CORONARY ANGIOPLASTY WITH STENT PLACEMENT  08/18/1995   PTA/STENT CIRC  . CORONARY ANGIOPLASTY WITH STENT PLACEMENT  06/18/1996   PTCA  posterior descending  branch of the RCA  95 AND 80% TO LESSTHAN 20%  . [place]  . DOPPLER ECHOCARDIOGRAPHY  04/17/2010   EF =50-55%; mild concentric LVH,LAE, AORTIC SCLEROSIS.TRACE TO MILD CENTRAL AI, TRACE MITRAL REGURGITATION, CANNOT ASSESS DIASTOLIC FUNCTIONDUE TO UNDERLYING RHYTHM,PACERMAKER WIRE IN THE RA/RV  . DOPPLER ECHOCARDIOGRAPHY  04/21/2007   DONE IN [place]-- MILD MITRAL REGURG., MILD TRICUSPID REGURG. WITH MILD PULM. HTN, MILD CONCENTRIC LEFT VENTRICULAR HYPERTROPHY,MILD LEFT ATRIAL ENLARGEMENT.  . EYE SURGERY  1983   Cataract  . HERNIA REPAIR Bilateral 2000  . INSERT / REPLACE / REMOVE PACEMAKER  SEPT 2008   INSERT DUAL -CHAMBER-medtronic adapta ADDR01 serial   [id number]  . NM MYOCAR MULTIPLE W/SPECT  04/17/2010   EF 50%; LOW NORMAL LV FUNCTION,  . PACEMAKER INSERTION  2008   DUAL-CHAMBER  . PPM GENERATOR CHANGEOUT N/A 12/29/2016   Procedure: PPM Generator Changeout;  Surgeon: Evans Lance, MD;  Location: [facility];  Service: Cardiovascular;  Laterality: N/A;    Family History  Problem Relation Age of Onset  . Peripheral vascular disease Mother   . Hypertension Mother   . Diabetes Mother   . Heart disease Mother   . Hyperlipidemia Mother   . Peripheral vascular disease Sister   . Cancer Sister   . Diabetes Sister   . Heart disease Sister  Stent- Heart Disease before age 16  . Hyperlipidemia Sister   . Hypertension Sister   . Peripheral vascular disease Brother   . Diabetes Brother   . Heart disease Brother        CHF and Heart Disease before age 1  . Hyperlipidemia Brother   . Hypertension Brother   . Stroke Father 28       TIA    Current Outpatient Medications on File Prior to Visit  Medication Sig Dispense Refill  . atorvastatin (LIPITOR) 80 MG tablet TAKE ONE TABLET BY MOUTH DAILY. (Patient taking differently: Take 40 mg by mouth. Takes half) 90 tablet 3  . diltiazem (CARDIZEM CD) 240 MG 24 hr capsule Take 240 mg by mouth at bedtime.     Marland Kitchen levothyroxine  (SYNTHROID, LEVOTHROID) 75 MCG tablet Take 75 mcg by mouth daily before breakfast.    . losartan (COZAAR) 50 MG tablet TAKE 1 TABLET BY MOUTH ONCE DAILY. 90 tablet 3  . saw palmetto 500 MG capsule Take 500 mg by mouth daily.     . sotalol (BETAPACE) 160 MG tablet TAKE (1) TABLET BY MOUTH TWICE DAILY. 180 tablet 3  . warfarin (COUMADIN) 5 MG tablet TAKE 1 TABLET BY MOUTH DAILY EXCEPT ON THURSDAYS TAKE 1 AND 1/2 TABLETS. 100 tablet 1  . atorvastatin (LIPITOR) 40 MG tablet Take 40 mg by mouth daily.     No current facility-administered medications on file prior to visit.     Allergies  Allergen Reactions  . Fexofenadine Hcl Palpitations    Allegra    Social History   Substance and Sexual Activity  Alcohol Use No  . Alcohol/week: 0.0 standard drinks    Social History   Tobacco Use  Smoking Status Never Smoker  Smokeless Tobacco Never Used    Review of Systems  Constitutional: Negative.   HENT: Negative.   Eyes: Negative.   Respiratory: Negative.   Cardiovascular: Negative.   Gastrointestinal: Negative for abdominal pain, heartburn, nausea and vomiting.  Genitourinary: Negative.   Musculoskeletal: Negative.   Skin: Negative.   Neurological: Negative.   Endo/Heme/Allergies: Bruises/bleeds easily.  Psychiatric/Behavioral: Negative.     Objective   Vitals:   12/12/18 1002  BP: (!) 156/80  Pulse: 76  Resp: 16  Temp: 98.9 F (37.2 C)  SpO2: 96%    Physical Exam Constitutional:      Appearance: Normal appearance. He is not ill-appearing.  HENT:     Head: Normocephalic and atraumatic.  Cardiovascular:     Rate and Rhythm: Normal rate and regular rhythm.     Heart sounds: Normal heart sounds. No murmur. No friction rub. No gallop.   Pulmonary:     Effort: Pulmonary effort is normal. No respiratory distress.     Breath sounds: Normal breath sounds. No stridor. No wheezing, rhonchi or rales.  Abdominal:     General: Abdomen is flat. Bowel sounds are normal. There  is no distension.     Palpations: Abdomen is soft. There is no mass.     Tenderness: There is no abdominal tenderness. There is no guarding or rebound.     Hernia: A hernia is present.     Comments: Moderate sized right inguinal hernia that could not be reduced.  No erythema noted.  Skin:    General: Skin is warm and dry.  Neurological:     Mental Status: He is alert and oriented to person, place, and time.     Assessment  Incarcerated right inguinal hernia,  chronically anticoagulated with Coumadin, pacemaker in place Plan   Patient will be admitted to the hospital today.  We will check his INR.  Will get cardiology consultation.  Plan to perform right inguinal herniorrhaphy with mesh on 12/13/2018.  The risks and benefits of the procedure including bleeding, infection, cardiopulmonary difficulties, stroke, the possibility of a bowel resection were fully explained to the patient, who gave informed consent.

## 2018-12-13 ENCOUNTER — Inpatient Hospital Stay (HOSPITAL_COMMUNITY): Payer: Medicare Other | Admitting: Anesthesiology

## 2018-12-13 ENCOUNTER — Encounter (HOSPITAL_COMMUNITY)
Admission: AD | Disposition: A | Discharge status: Home / Self Care | Payer: Self-pay | Source: Ambulatory Visit | Attending: General Surgery

## 2018-12-13 ENCOUNTER — Ambulatory Visit (HOSPITAL_COMMUNITY): Admission: RE | Admit: 2018-12-13 | Payer: Medicare Other | Source: Home / Self Care | Admitting: General Surgery

## 2018-12-13 ENCOUNTER — Other Ambulatory Visit: Payer: Self-pay

## 2018-12-13 DIAGNOSIS — K4030 Unilateral inguinal hernia, with obstruction, without gangrene, not specified as recurrent: Secondary | ICD-10-CM

## 2018-12-13 DIAGNOSIS — I4891 Unspecified atrial fibrillation: Secondary | ICD-10-CM | POA: Diagnosis not present

## 2018-12-13 DIAGNOSIS — K4031 Unilateral inguinal hernia, with obstruction, without gangrene, recurrent: Secondary | ICD-10-CM | POA: Diagnosis not present

## 2018-12-13 DIAGNOSIS — Z7901 Long term (current) use of anticoagulants: Secondary | ICD-10-CM | POA: Diagnosis not present

## 2018-12-13 DIAGNOSIS — Z950 Presence of cardiac pacemaker: Secondary | ICD-10-CM | POA: Diagnosis not present

## 2018-12-13 HISTORY — PX: INGUINAL HERNIA REPAIR: SHX194

## 2018-12-13 LAB — GLUCOSE, CAPILLARY: Glucose-Capillary: 88 mg/dL (ref 70–99)

## 2018-12-13 SURGERY — REPAIR, HERNIA, INGUINAL, ADULT
Anesthesia: General | Site: Groin | Laterality: Right

## 2018-12-13 MED ORDER — SODIUM CHLORIDE (PF) 0.9 % IJ SOLN
INTRAMUSCULAR | Status: AC
  Filled 2018-12-13: qty 20

## 2018-12-13 MED ORDER — METOPROLOL TARTRATE 5 MG/5ML IV SOLN
INTRAVENOUS | Status: AC
  Filled 2018-12-13: qty 5

## 2018-12-13 MED ORDER — SODIUM CHLORIDE 0.9 % IR SOLN
Status: DC | PRN
  Administered 2018-12-13: 1000 mL

## 2018-12-13 MED ORDER — LIDOCAINE 2% (20 MG/ML) 5 ML SYRINGE
INTRAMUSCULAR | Status: AC
  Filled 2018-12-13: qty 5

## 2018-12-13 MED ORDER — ROCURONIUM BROMIDE 100 MG/10ML IV SOLN
INTRAVENOUS | Status: DC | PRN
  Administered 2018-12-13: 30 mg via INTRAVENOUS

## 2018-12-13 MED ORDER — FENTANYL CITRATE (PF) 100 MCG/2ML IJ SOLN
INTRAMUSCULAR | Status: AC
  Filled 2018-12-13: qty 2

## 2018-12-13 MED ORDER — PHENYLEPHRINE 40 MCG/ML (10ML) SYRINGE FOR IV PUSH (FOR BLOOD PRESSURE SUPPORT)
PREFILLED_SYRINGE | INTRAVENOUS | Status: AC
  Filled 2018-12-13: qty 10

## 2018-12-13 MED ORDER — HYDROMORPHONE HCL 1 MG/ML IJ SOLN: 0.2500 mg | INTRAMUSCULAR | Status: DC | PRN

## 2018-12-13 MED ORDER — METOCLOPRAMIDE HCL 5 MG/ML IJ SOLN
INTRAMUSCULAR | Status: AC
  Filled 2018-12-13: qty 2

## 2018-12-13 MED ORDER — TRAMADOL HCL 50 MG PO TABS: 50.0000 mg | ORAL_TABLET | Freq: Four times a day (QID) | ORAL | 0 refills | Status: DC | PRN

## 2018-12-13 MED ORDER — SUGAMMADEX SODIUM 500 MG/5ML IV SOLN
INTRAVENOUS | Status: AC
  Filled 2018-12-13: qty 5

## 2018-12-13 MED ORDER — LIDOCAINE HCL (CARDIAC) PF 50 MG/5ML IV SOSY
PREFILLED_SYRINGE | INTRAVENOUS | Status: DC | PRN
  Administered 2018-12-13: 80 mg via INTRAVENOUS

## 2018-12-13 MED ORDER — MEPERIDINE HCL 50 MG/ML IJ SOLN: 6.2500 mg | INTRAMUSCULAR | Status: DC | PRN

## 2018-12-13 MED ORDER — ROCURONIUM BROMIDE 10 MG/ML (PF) SYRINGE
PREFILLED_SYRINGE | INTRAVENOUS | Status: AC
  Filled 2018-12-13: qty 10

## 2018-12-13 MED ORDER — BUPIVACAINE LIPOSOME 1.3 % IJ SUSP
INTRAMUSCULAR | Status: DC | PRN
  Administered 2018-12-13: 20 mL

## 2018-12-13 MED ORDER — FENTANYL CITRATE (PF) 100 MCG/2ML IJ SOLN
INTRAMUSCULAR | Status: DC | PRN
  Administered 2018-12-13 (×2): 50 ug via INTRAVENOUS

## 2018-12-13 MED ORDER — SUCCINYLCHOLINE CHLORIDE 20 MG/ML IJ SOLN
INTRAMUSCULAR | Status: DC | PRN
  Administered 2018-12-13: 100 mg via INTRAVENOUS

## 2018-12-13 MED ORDER — SUCCINYLCHOLINE CHLORIDE 200 MG/10ML IV SOSY
PREFILLED_SYRINGE | INTRAVENOUS | Status: AC
  Filled 2018-12-13: qty 10

## 2018-12-13 MED ORDER — PROMETHAZINE HCL 25 MG/ML IJ SOLN: 6.2500 mg | INTRAMUSCULAR | Status: DC | PRN

## 2018-12-13 MED ORDER — PROPOFOL 10 MG/ML IV BOLUS
INTRAVENOUS | Status: DC | PRN
  Administered 2018-12-13: 130 mg via INTRAVENOUS

## 2018-12-13 MED ORDER — SUGAMMADEX SODIUM 200 MG/2ML IV SOLN
INTRAVENOUS | Status: AC
  Filled 2018-12-13: qty 2

## 2018-12-13 MED ORDER — KETOROLAC TROMETHAMINE 30 MG/ML IJ SOLN: 15.0000 mg | Freq: Once | INTRAMUSCULAR | Status: DC

## 2018-12-13 MED ORDER — LACTATED RINGERS IV SOLN: INTRAVENOUS | Status: DC

## 2018-12-13 MED ORDER — SUGAMMADEX SODIUM 500 MG/5ML IV SOLN
INTRAVENOUS | Status: DC | PRN
  Administered 2018-12-13 (×2): 100 mg via INTRAVENOUS

## 2018-12-13 MED ORDER — LACTATED RINGERS IV SOLN
INTRAVENOUS | Status: DC
  Administered 2018-12-13: 09:00:00 via INTRAVENOUS

## 2018-12-13 MED ORDER — HYDROCODONE-ACETAMINOPHEN 7.5-325 MG PO TABS
1.0000 | ORAL_TABLET | Freq: Once | ORAL | Status: AC | PRN
  Administered 2018-12-13: 1 via ORAL
  Filled 2018-12-13: qty 1

## 2018-12-13 SURGICAL SUPPLY — 36 items
ADH SKN CLS APL DERMABOND .7 (GAUZE/BANDAGES/DRESSINGS) ×1
CLOTH BEACON ORANGE TIMEOUT ST (SAFETY) ×2 IMPLANT
COVER LIGHT HANDLE STERIS (MISCELLANEOUS) ×4 IMPLANT
DERMABOND ADVANCED (GAUZE/BANDAGES/DRESSINGS) ×1
DERMABOND ADVANCED .7 DNX12 (GAUZE/BANDAGES/DRESSINGS) ×1 IMPLANT
DRAIN PENROSE 18X1/2 LTX STRL (DRAIN) ×2 IMPLANT
ELECT REM PT RETURN 9FT ADLT (ELECTROSURGICAL) ×2
ELECTRODE REM PT RTRN 9FT ADLT (ELECTROSURGICAL) ×1 IMPLANT
GAUZE SPONGE 4X4 12PLY STRL (GAUZE/BANDAGES/DRESSINGS) ×2 IMPLANT
GLOVE BIOGEL PI IND STRL 7.0 (GLOVE) ×2 IMPLANT
GLOVE BIOGEL PI INDICATOR 7.0 (GLOVE) ×2
GLOVE SURG SS PI 7.5 STRL IVOR (GLOVE) ×2 IMPLANT
GOWN STRL REUS W/TWL LRG LVL3 (GOWN DISPOSABLE) ×6 IMPLANT
INST SET MINOR GENERAL (KITS) ×2 IMPLANT
KIT TURNOVER KIT A (KITS) ×2 IMPLANT
LIGASURE IMPACT 36 18CM CVD LR (INSTRUMENTS) ×1 IMPLANT
MANIFOLD NEPTUNE II (INSTRUMENTS) ×2 IMPLANT
MESH MARLEX PLUG MEDIUM (Mesh General) ×1 IMPLANT
NDL HYPO 21X1.5 SAFETY (NEEDLE) ×1 IMPLANT
NEEDLE HYPO 21X1.5 SAFETY (NEEDLE) ×2 IMPLANT
NS IRRIG 1000ML POUR BTL (IV SOLUTION) ×2 IMPLANT
PACK MINOR (CUSTOM PROCEDURE TRAY) ×2 IMPLANT
PAD ARMBOARD 7.5X6 YLW CONV (MISCELLANEOUS) ×2 IMPLANT
SET BASIN LINEN APH (SET/KITS/TRAYS/PACK) ×2 IMPLANT
SOL PREP PROV IODINE SCRUB 4OZ (MISCELLANEOUS) ×2 IMPLANT
SUT MNCRL AB 4-0 PS2 18 (SUTURE) ×2 IMPLANT
SUT NOVA NAB GS-22 2 2-0 T-19 (SUTURE) ×3 IMPLANT
SUT PROLENE 2 0 SH 30 (SUTURE) IMPLANT
SUT SILK 3 0 (SUTURE) ×2
SUT SILK 3-0 18XBRD TIE 12 (SUTURE) IMPLANT
SUT VIC AB 2-0 CT1 27 (SUTURE) ×2
SUT VIC AB 2-0 CT1 TAPERPNT 27 (SUTURE) ×1 IMPLANT
SUT VIC AB 3-0 SH 27 (SUTURE) ×4
SUT VIC AB 3-0 SH 27X BRD (SUTURE) ×1 IMPLANT
SUT VICRYL AB 3 0 TIES (SUTURE) IMPLANT
SYR 20CC LL (SYRINGE) ×2 IMPLANT

## 2018-12-13 NOTE — Transfer of Care (Signed)
Immediate Anesthesia Transfer of Care Note  Patient: Johnny Reynolds  Procedure(s) Performed: HERNIA REPAIR INGUINAL ADULT WITH MESH (Right Groin)  Patient Location: PACU  Anesthesia Type:General  Level of Consciousness: awake and patient cooperative  Airway & Oxygen Therapy: Patient Spontanous Breathing and Patient connected to face mask oxygen  Post-op Assessment: Report given to RN and Post -op Vital signs reviewed and stable  Post vital signs: Reviewed and stable  Last Vitals:  Vitals Value Taken Time  BP    Temp    Pulse 83 12/13/2018 11:35 AM  Resp    SpO2 92 % 12/13/2018 11:35 AM  Vitals shown include unvalidated device data.  Last Pain:  Vitals:   12/13/18 0857  TempSrc: Oral  PainSc: 0-No pain         Complications: No apparent anesthesia complications

## 2018-12-13 NOTE — Anesthesia Procedure Notes (Signed)
Procedure Name: Intubation Date/Time: 12/13/2018 10:21 AM Performed by: Vista Deck, CRNA Pre-anesthesia Checklist: Patient identified, Emergency Drugs available, Suction available, Patient being monitored and Timeout performed Patient Re-evaluated:Patient Re-evaluated prior to induction Oxygen Delivery Method: Circle system utilized Preoxygenation: Pre-oxygenation with 100% oxygen Induction Type: IV induction Ventilation: Mask ventilation without difficulty Grade View: Grade II Tube type: Oral Tube size: 7.0 mm Number of attempts: 1 Airway Equipment and Method: Patient positioned with wedge pillow Placement Confirmation: ETT inserted through vocal cords under direct vision,  positive ETCO2,  CO2 detector and breath sounds checked- equal and bilateral Secured at: 22 cm Tube secured with: Tape Dental Injury: Teeth and Oropharynx as per pre-operative assessment

## 2018-12-13 NOTE — Anesthesia Preprocedure Evaluation (Signed)
Anesthesia Evaluation    Airway Mallampati: II       Dental   Pulmonary sleep apnea ,    breath sounds clear to auscultation       Cardiovascular hypertension, On Medications + CAD  + dysrhythmias Atrial Fibrillation + pacemaker  Rhythm:regular     Neuro/Psych    GI/Hepatic   Endo/Other  diabetes, Type 2  Renal/GU      Musculoskeletal   Abdominal   Peds  Hematology   Anesthesia Other Findings ASCVD hx s/p angioplasty/stent Afib hx, now atrial paced OSA on CPAP  Reproductive/Obstetrics                             Anesthesia Physical Anesthesia Plan  ASA: III  Anesthesia Plan: General   Post-op Pain Management:    Induction:   PONV Risk Score and Plan:   Airway Management Planned:   Additional Equipment:   Intra-op Plan:   Post-operative Plan:   Informed Consent: I have reviewed the patients History and Physical, chart, labs and discussed the procedure including the risks, benefits and alternatives for the proposed anesthesia with the patient or authorized representative who has indicated his/her understanding and acceptance.     Dental Advisory Given  Plan Discussed with: Anesthesiologist  Anesthesia Plan Comments:         Anesthesia Quick Evaluation

## 2018-12-13 NOTE — Discharge Instructions (Signed)
Restart Coumadin tomorrow     Open Hernia Repair, Adult, Care After This sheet gives you information about how to care for yourself after your procedure. Your health care provider may also give you more specific instructions. If you have problems or questions, contact your health care provider. What can I expect after the procedure? After the procedure, it is common to have:  Mild discomfort.  Slight bruising.  Minor swelling.  Pain in the abdomen. Follow these instructions at home: Incision care   Follow instructions from your health care provider about how to take care of your incision area. Make sure you: ? Wash your hands with soap and water before you change your bandage (dressing). If soap and water are not available, use hand sanitizer. ? Change your dressing as told by your health care provider. ? Leave stitches (sutures), skin glue, or adhesive strips in place. These skin closures may need to stay in place for 2 weeks or longer. If adhesive strip edges start to loosen and curl up, you may trim the loose edges. Do not remove adhesive strips completely unless your health care provider tells you to do that.  Check your incision area every day for signs of infection. Check for: ? More redness, swelling, or pain. ? More fluid or blood. ? Warmth. ? Pus or a bad smell. Activity  Do not drive or use heavy machinery while taking prescription pain medicine. Do not drive until your health care provider approves.  Until your health care provider approves: ? Do not lift anything that is heavier than 10 lb (4.5 kg). ? Do not play contact sports.  Return to your normal activities as told by your health care provider. Ask your health care provider what activities are safe. General instructions  To prevent or treat constipation while you are taking prescription pain medicine, your health care provider may recommend that you: ? Drink enough fluid to keep your urine clear or pale  yellow. ? Take over-the-counter or prescription medicines. ? Eat foods that are high in fiber, such as fresh fruits and vegetables, whole grains, and beans. ? Limit foods that are high in fat and processed sugars, such as fried and sweet foods.  Take over-the-counter and prescription medicines only as told by your health care provider.  Do not take tub baths or go swimming until your health care provider approves.  Keep all follow-up visits as told by your health care provider. This is important. Contact a health care provider if:  You develop a rash.  You have more redness, swelling, or pain around your incision.  You have more fluid or blood coming from your incision.  Your incision feels warm to the touch.  You have pus or a bad smell coming from your incision.  You have a fever or chills.  You have blood in your stool (feces).  You have not had a bowel movement in 2-3 days.  Your pain is not controlled with medicine. Get help right away if:  You have chest pain or shortness of breath.  You feel light-headed or feel faint.  You have severe pain.  You vomit and your pain is worse. This information is not intended to replace advice given to you by your health care provider. Make sure you discuss any questions you have with your health care provider. Document Released: 02/26/2005 Document Revised: 02/27/2016 Document Reviewed: 01/21/2016 Elsevier Interactive Patient Education  2019 Elsevier Inc[employer]  PATIENT INSTRUCTIONS POST-ANESTHESIA  IMMEDIATELY FOLLOWING SURGERY:  Do not  drive or operate machinery for the first twenty four hours after surgery.  Do not make any important decisions for twenty four hours after surgery or while taking narcotic pain medications or sedatives.  If you develop intractable nausea and vomiting or a severe headache please notify your doctor immediately.  FOLLOW-UP:  Please make an appointment with your surgeon as instructed. You do not need to  follow up with anesthesia unless specifically instructed to do so.  WOUND CARE INSTRUCTIONS (if applicable):  Keep a dry clean dressing on the anesthesia/puncture wound site if there is drainage.  Once the wound has quit draining you may leave it open to air.  Generally you should leave the bandage intact for twenty four hours unless there is drainage.  If the epidural site drains for more than 36-48 hours please call the anesthesia department.  QUESTIONS?:  Please feel free to call your physician or the hospital operator if you have any questions, and they will be happy to assist you.

## 2018-12-13 NOTE — Interval H&P Note (Signed)
History and Physical Interval Note:  12/13/2018 9:20 AM  Johnny Reynolds  has presented today for surgery, with the diagnosis of incarcerated right inguinal hernia.  The various methods of treatment have been discussed with the patient and family. After consideration of risks, benefits and other options for treatment, the patient has consented to  Procedure(s): HERNIA REPAIR INGUINAL ADULT WITH MESH (Right) as a surgical intervention.  The patient's history has been reviewed, patient examined, no change in status, stable for surgery.  I have reviewed the patient's chart and labs.  Questions were answered to the patient's satisfaction.     Aviva Signs

## 2018-12-13 NOTE — Op Note (Signed)
Patient:  Johnny Reynolds  DOB:  29-Oct-1940  MRN:  [id number]   Preop Diagnosis: Incarcerated recurrent right inguinal hernia  Postop Diagnosis: Same  Procedure: Recurrent right inguinal herniorrhaphy with mesh  Surgeon: Aviva Signs, MD  Assistant: Curlene Labrum, MD  Anes: General endotracheal  Indications: Patient is a 78 year old white male who is chronically anticoagulated with Coumadin and has a pacemaker in place who 5 days ago developed right groin pain and swelling when lifting something heavy with a wheelbarrow.  He presented to my office yesterday and was noted to have an incarcerated right inguinal hernia.  He was admitted the hospital for preoperative work-up and now presents for a recurrent right inguinal herniorrhaphy with mesh.  He also knows there is the possibility of a bowel resection.  The risks and benefits of the procedure including bleeding, infection, cardiopulmonary difficulties, the possibility of a bowel resection, and the possibility of recurrence of the hernia were fully explained to the patient, who gave informed consent.  Procedure note: The patient was placed in the supine position.  After induction of general endotracheal anesthesia, the right groin region was prepped and draped using the usual sterile technique with Betadine.  Surgical site confirmation was performed.  Incision was made in the right groin region down to the external oblique aponeurosis.  This was noted to be significantly attenuated.  The spermatic cord was identified and a Penrose drain was placed around it.  There was a hard mass within the hernia sac from a small narrow hole opening in the internal ring.  The hernia sac was opened and necrotic omentum was present.  No bowel was present.  This was excised using the LigaSure and sent to pathology further examination.  The remaining hernia sac was excised up to the peritoneal reflection.  The resultant indirect hernia was one half to  three-quarter centimeter in size.  A re-sized Bard PerFix plug was then inserted to plug the hernia defect.  It was difficult to ascertain the type of previous repair done in the remote past.  No mesh was seen.  The superior aspect of the mesh was placed without sutures to the conjoint tendon.  The internal ring was recreated in the patch.  The external oblique aponeurosis was reapproximated using a 2-0 Vicryl running suture.  The subcutaneous layer was reapproximated using a 3-0 Vicryl interrupted suture.  Exparel was instilled into the surrounding wound.  The skin was closed using a 4-0 Monocryl subcuticular suture.  Dermabond was applied.  All tape and needle counts were correct at the end of the procedure.  The patient was extubated in the operating room and transferred to PACU in stable condition.  Complications: None  EBL: Minimal  Specimen: Omental hernia contents

## 2018-12-13 NOTE — Anesthesia Postprocedure Evaluation (Signed)
Anesthesia Post Note  Patient: LONZO SAULTER  Procedure(s) Performed: HERNIA REPAIR INGUINAL ADULT WITH MESH (Right Groin)  Patient location during evaluation: PACU Anesthesia Type: General Level of consciousness: awake and alert and patient cooperative Pain management: satisfactory to patient Vital Signs Assessment: post-procedure vital signs reviewed and stable Respiratory status: spontaneous breathing Cardiovascular status: stable Postop Assessment: no apparent nausea or vomiting Anesthetic complications: no     Last Vitals:  Vitals:   12/13/18 1134 12/13/18 1145  BP: (!) 184/89 (!) 181/87  Pulse: 83 70  Resp: 16 16  Temp: 36.6 C   SpO2: 97% 98%    Last Pain:  Vitals:   12/13/18 1145  TempSrc:   PainSc: 0-No pain                 Louann Sjogren

## 2018-12-14 ENCOUNTER — Encounter (HOSPITAL_COMMUNITY): Payer: Self-pay | Admitting: General Surgery

## 2018-12-14 NOTE — Discharge Summary (Signed)
Physician Discharge Summary  Patient ID: Johnny Reynolds MRN: [id number] DOB/AGE: 78-Oct-1942 78 y.o.  Admit date: 12/12/2018 Discharge date: 12/13/2018  Admission Diagnoses: Incarcerated recurrent right inguinal hernia, chronic anticoagulation  Discharge Diagnoses: Same Active Problems:   Incarcerated right inguinal hernia   Discharged Condition: good  Hospital Course: Patient is a 78 year old white male who was seen in my office on 12/12/2018 with a recurrent incarcerated right inguinal hernia.  He was admitted to the hospital on 12/12/2018 pain control and preoperative testing.  His preoperative INR was 1.6.  He also has a pacemaker in place.  He subsequently underwent a recurrent right inguinal herniorrhaphy with mesh on 12/13/2018.  Tolerated procedure well.  He was discharged home from the PACU in good and stable condition.  No bowel involvement was noted.  Treatments: surgery: Recurrent right inguinal herniorrhaphy with mesh for incarceration on 12/13/2018  Discharge Exam: Blood pressure (!) 147/79, pulse 70, temperature 98.1 F (36.7 C), temperature source Oral, resp. rate 16, height 5\' 6"  (1.676 m), weight 62.6 kg, SpO2 99 %. General appearance: alert, cooperative and no distress Resp: clear to auscultation bilaterally Cardio: regular rate and rhythm, S1, S2 normal, no murmur, click, rub or gallop GI: Soft, incision healing well.  Disposition: Home  Discharge Instructions    Diet - low sodium heart healthy   Complete by:  As directed    Increase activity slowly   Complete by:  As directed      Allergies as of 12/13/2018      Reactions   Fexofenadine Hcl Palpitations   Allegra      Medication List    TAKE these medications   atorvastatin 80 MG tablet Commonly known as:  LIPITOR TAKE ONE TABLET BY MOUTH DAILY. What changed:    how much to take  when to take this  additional instructions   diltiazem 240 MG 24 hr capsule Commonly known as:  CARDIZEM CD Take 240  mg by mouth at bedtime.   levothyroxine 88 MCG tablet Commonly known as:  SYNTHROID Take 88 mcg by mouth daily before breakfast.   losartan 50 MG tablet Commonly known as:  COZAAR TAKE 1 TABLET BY MOUTH ONCE DAILY. What changed:  when to take this   saw palmetto 500 MG capsule Take 500 mg by mouth every morning.   sotalol 160 MG tablet Commonly known as:  BETAPACE TAKE (1) TABLET BY MOUTH TWICE DAILY. What changed:  See the new instructions.   Systane Ultra 0.4-0.3 % Soln Generic drug:  Polyethyl Glycol-Propyl Glycol Apply 1-2 drops to eye daily as needed (FOR DRY EYE RELIEF).   traMADol 50 MG tablet Commonly known as:  Ultram Take 1 tablet (50 mg total) by mouth every 6 (six) hours as needed for severe pain.   warfarin 5 MG tablet Commonly known as:  COUMADIN Take as directed. If you are unsure how to take this medication, talk to your nurse or doctor. Original instructions:  TAKE 1 TABLET BY MOUTH DAILY EXCEPT ON THURSDAYS TAKE 1 AND 1/2 TABLETS. What changed:  See the new instructions.      Follow-up Information    Aviva Signs, MD.   Specialty:  General Surgery Why:  Will call you next week for follow up Contact information: [address] [facility] [address] [phone]           Signed: Aviva Signs 12/14/2018, 10:53 AM

## 2018-12-26 ENCOUNTER — Other Ambulatory Visit: Payer: Self-pay

## 2018-12-26 ENCOUNTER — Ambulatory Visit (INDEPENDENT_AMBULATORY_CARE_PROVIDER_SITE_OTHER): Payer: Self-pay | Admitting: General Surgery

## 2018-12-26 ENCOUNTER — Encounter: Payer: Self-pay | Admitting: General Surgery

## 2018-12-26 VITALS — BP 151/78 | HR 77 | Temp 98.4°F | Resp 16 | Wt 148.0 lb

## 2018-12-26 DIAGNOSIS — Z09 Encounter for follow-up examination after completed treatment for conditions other than malignant neoplasm: Secondary | ICD-10-CM

## 2018-12-26 NOTE — Progress Notes (Signed)
Subjective:     Johnny Reynolds  Here for postoperative visit.  Patient states his preoperative symptoms are much improved.  He does have some swelling in that region, but appears to be resolving.  It does not appear to be affecting his lifestyle at this time. Objective:    BP (!) 151/78 (BP Location: Left Arm, Patient Position: Sitting, Cuff Size: Normal)   Pulse 77   Temp 98.4 F (36.9 C) (Temporal)   Resp 16   Wt 148 lb (67.1 kg)   SpO2 98%   BMI 23.89 kg/m   General:  alert, cooperative and no distress  Abdomen is soft, incision healing well.  He does have some thickening of the spermatic cord.  It is nontender.  No recurrent hernias present.     Assessment:    Doing well postoperatively.    Plan:   He may increase his activity as able.  He was told to avoid any heavy lifting.  I told him that the swelling of the spermatic cord should resolve with time, though it may not totally go away.  He understands this.  He will follow-up with me as needed.

## 2019-01-03 ENCOUNTER — Telehealth: Payer: Self-pay | Admitting: *Deleted

## 2019-01-03 NOTE — Telephone Encounter (Signed)

## 2019-01-05 ENCOUNTER — Telehealth: Payer: Self-pay | Admitting: Pharmacist

## 2019-01-05 NOTE — Telephone Encounter (Signed)
Called pt to discuss changing from warfarin to [place] to help reduce office visits and exposure during COVID-19 pandemic, as well as for improved efficacy/safety profile. Pt has Pharmacist, community and would qualify for Eliquis 5mg  BID $10/month copay card.

## 2019-01-05 NOTE — Telephone Encounter (Signed)
Spoke with patient, he states he is not interested in switching to another blood thinner. States "I want to stay on coumadin"

## 2019-01-08 ENCOUNTER — Other Ambulatory Visit: Payer: Self-pay

## 2019-01-08 ENCOUNTER — Ambulatory Visit (INDEPENDENT_AMBULATORY_CARE_PROVIDER_SITE_OTHER): Payer: Medicare Other | Admitting: *Deleted

## 2019-01-08 DIAGNOSIS — I4891 Unspecified atrial fibrillation: Secondary | ICD-10-CM

## 2019-01-08 DIAGNOSIS — Z5181 Encounter for therapeutic drug level monitoring: Secondary | ICD-10-CM

## 2019-01-08 LAB — POCT INR: INR: 2.6 (ref 2.0–3.0)

## 2019-01-08 NOTE — Patient Instructions (Signed)
Continue coumadin 1 tablet daily except 1 1/2 tablets on Thursdays Recheck in 6 weeks 

## 2019-01-16 ENCOUNTER — Ambulatory Visit (INDEPENDENT_AMBULATORY_CARE_PROVIDER_SITE_OTHER): Payer: Medicare Other | Admitting: *Deleted

## 2019-01-16 DIAGNOSIS — I495 Sick sinus syndrome: Secondary | ICD-10-CM

## 2019-01-16 DIAGNOSIS — I4891 Unspecified atrial fibrillation: Secondary | ICD-10-CM

## 2019-01-16 LAB — CUP PACEART REMOTE DEVICE CHECK
Battery Remaining Longevity: 129 mo
Battery Voltage: 3.02 V
Brady Statistic AP VP Percent: 0.05 %
Brady Statistic AP VS Percent: 99.54 %
Brady Statistic AS VP Percent: 0 %
Brady Statistic AS VS Percent: 0.41 %
Brady Statistic RA Percent Paced: 99.59 %
Brady Statistic RV Percent Paced: 0.05 %
Date Time Interrogation Session: 20200526130737
Implantable Lead Implant Date: 20080902
Implantable Lead Implant Date: 20080902
Implantable Lead Location: 753859
Implantable Lead Location: 753860
Implantable Lead Model: 5076
Implantable Lead Model: 5076
Implantable Pulse Generator Implant Date: 20180509
Lead Channel Impedance Value: 323 Ohm
Lead Channel Impedance Value: 342 Ohm
Lead Channel Impedance Value: 437 Ohm
Lead Channel Impedance Value: 475 Ohm
Lead Channel Pacing Threshold Amplitude: 0.75 V
Lead Channel Pacing Threshold Amplitude: 1 V
Lead Channel Pacing Threshold Pulse Width: 0.4 ms
Lead Channel Pacing Threshold Pulse Width: 0.4 ms
Lead Channel Sensing Intrinsic Amplitude: 0.875 mV
Lead Channel Sensing Intrinsic Amplitude: 0.875 mV
Lead Channel Sensing Intrinsic Amplitude: 20.5 mV
Lead Channel Sensing Intrinsic Amplitude: 20.5 mV
Lead Channel Setting Pacing Amplitude: 2 V
Lead Channel Setting Pacing Amplitude: 2.5 V
Lead Channel Setting Pacing Pulse Width: 0.4 ms
Lead Channel Setting Sensing Sensitivity: 0.9 mV

## 2019-01-26 NOTE — Progress Notes (Signed)
Remote pacemaker transmission.   

## 2019-02-19 ENCOUNTER — Other Ambulatory Visit: Payer: Self-pay

## 2019-02-19 ENCOUNTER — Ambulatory Visit (INDEPENDENT_AMBULATORY_CARE_PROVIDER_SITE_OTHER): Payer: Medicare Other | Admitting: *Deleted

## 2019-02-19 DIAGNOSIS — I4891 Unspecified atrial fibrillation: Secondary | ICD-10-CM | POA: Diagnosis not present

## 2019-02-19 DIAGNOSIS — Z5181 Encounter for therapeutic drug level monitoring: Secondary | ICD-10-CM

## 2019-02-19 LAB — POCT INR: INR: 3 (ref 2.0–3.0)

## 2019-02-19 NOTE — Patient Instructions (Signed)
Continue coumadin 1 tablet daily except 1 1/2 tablets on Thursdays Recheck in 6 weeks 

## 2019-02-25 ENCOUNTER — Other Ambulatory Visit: Payer: Self-pay | Admitting: Cardiology

## 2019-03-30 ENCOUNTER — Telehealth: Payer: Self-pay | Admitting: *Deleted

## 2019-03-30 NOTE — Telephone Encounter (Signed)

## 2019-04-02 ENCOUNTER — Other Ambulatory Visit: Payer: Self-pay

## 2019-04-02 ENCOUNTER — Ambulatory Visit (INDEPENDENT_AMBULATORY_CARE_PROVIDER_SITE_OTHER): Payer: Medicare Other | Admitting: *Deleted

## 2019-04-02 DIAGNOSIS — Z5181 Encounter for therapeutic drug level monitoring: Secondary | ICD-10-CM

## 2019-04-02 DIAGNOSIS — I4891 Unspecified atrial fibrillation: Secondary | ICD-10-CM

## 2019-04-02 LAB — POCT INR: INR: 3.2 — AB (ref 2.0–3.0)

## 2019-04-02 NOTE — Patient Instructions (Signed)
Take coumadin 1/2 tablet tonight then resume 1 tablet daily except 1 1/2 tablets on Thursdays.  Recheck in 6 weeks.

## 2019-04-18 ENCOUNTER — Ambulatory Visit (INDEPENDENT_AMBULATORY_CARE_PROVIDER_SITE_OTHER): Payer: Medicare Other | Admitting: *Deleted

## 2019-04-18 DIAGNOSIS — I495 Sick sinus syndrome: Secondary | ICD-10-CM

## 2019-04-18 LAB — CUP PACEART REMOTE DEVICE CHECK
Battery Remaining Longevity: 127 mo
Battery Voltage: 3.01 V
Brady Statistic AP VP Percent: 0.04 %
Brady Statistic AP VS Percent: 99.57 %
Brady Statistic AS VP Percent: 0 %
Brady Statistic AS VS Percent: 0.39 %
Brady Statistic RA Percent Paced: 99.61 %
Brady Statistic RV Percent Paced: 0.04 %
Date Time Interrogation Session: 20200826054336
Implantable Lead Implant Date: 20080902
Implantable Lead Implant Date: 20080902
Implantable Lead Location: 753859
Implantable Lead Location: 753860
Implantable Lead Model: 5076
Implantable Lead Model: 5076
Implantable Pulse Generator Implant Date: 20180509
Lead Channel Impedance Value: 342 Ohm
Lead Channel Impedance Value: 380 Ohm
Lead Channel Impedance Value: 456 Ohm
Lead Channel Impedance Value: 494 Ohm
Lead Channel Pacing Threshold Amplitude: 0.75 V
Lead Channel Pacing Threshold Amplitude: 0.875 V
Lead Channel Pacing Threshold Pulse Width: 0.4 ms
Lead Channel Pacing Threshold Pulse Width: 0.4 ms
Lead Channel Sensing Intrinsic Amplitude: 1.125 mV
Lead Channel Sensing Intrinsic Amplitude: 1.125 mV
Lead Channel Sensing Intrinsic Amplitude: 22.875 mV
Lead Channel Sensing Intrinsic Amplitude: 22.875 mV
Lead Channel Setting Pacing Amplitude: 2 V
Lead Channel Setting Pacing Amplitude: 2.5 V
Lead Channel Setting Pacing Pulse Width: 0.4 ms
Lead Channel Setting Sensing Sensitivity: 0.9 mV

## 2019-04-27 ENCOUNTER — Encounter: Payer: Self-pay | Admitting: Cardiology

## 2019-04-27 NOTE — Progress Notes (Signed)
Remote pacemaker transmission.   

## 2019-05-14 ENCOUNTER — Ambulatory Visit (INDEPENDENT_AMBULATORY_CARE_PROVIDER_SITE_OTHER): Payer: Medicare Other | Admitting: *Deleted

## 2019-05-14 ENCOUNTER — Other Ambulatory Visit: Payer: Self-pay

## 2019-05-14 DIAGNOSIS — Z5181 Encounter for therapeutic drug level monitoring: Secondary | ICD-10-CM

## 2019-05-14 DIAGNOSIS — I4821 Permanent atrial fibrillation: Secondary | ICD-10-CM | POA: Diagnosis not present

## 2019-05-14 DIAGNOSIS — I4891 Unspecified atrial fibrillation: Secondary | ICD-10-CM | POA: Diagnosis not present

## 2019-05-14 LAB — POCT INR: INR: 3.3 — AB (ref 2.0–3.0)

## 2019-05-14 NOTE — Patient Instructions (Signed)
Decrease dose to 1 tablet daily   Recheck in 6 weeks.

## 2019-05-15 ENCOUNTER — Ambulatory Visit (INDEPENDENT_AMBULATORY_CARE_PROVIDER_SITE_OTHER): Payer: Medicare Other | Admitting: Internal Medicine

## 2019-05-15 ENCOUNTER — Encounter: Payer: Self-pay | Admitting: Internal Medicine

## 2019-05-15 DIAGNOSIS — I495 Sick sinus syndrome: Secondary | ICD-10-CM | POA: Diagnosis not present

## 2019-05-15 LAB — CUP PACEART INCLINIC DEVICE CHECK
Date Time Interrogation Session: 20200922091549
Implantable Lead Implant Date: 20080902
Implantable Lead Implant Date: 20080902
Implantable Lead Location: 753859
Implantable Lead Location: 753860
Implantable Lead Model: 5076
Implantable Lead Model: 5076
Implantable Pulse Generator Implant Date: 20180509

## 2019-05-15 NOTE — Progress Notes (Signed)
HPI Johnny Reynolds returns today for followup. He is a pleasant 78 yo man with a h/o sinus node dysfunction, PAF, s/p PPM insertion. He has not had any additional bleeding. He feels well although he admits to being not as active. 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 Since his last visit, he has been treated for a hernia.  Allergies  Allergen Reactions  . Fexofenadine Hcl Palpitations    Allegra     Current Outpatient Medications  Medication Sig Dispense Refill  . atorvastatin (LIPITOR) 80 MG tablet TAKE ONE TABLET BY MOUTH DAILY. (Patient taking differently: Take 40 mg by mouth at bedtime. Takes half) 90 tablet 3  . diltiazem (CARDIZEM CD) 240 MG 24 hr capsule Take 240 mg by mouth at bedtime.     Marland Kitchen levothyroxine (SYNTHROID) 88 MCG tablet Take 88 mcg by mouth daily before breakfast.     . losartan (COZAAR) 50 MG tablet TAKE 1 TABLET BY MOUTH ONCE DAILY. (Patient taking differently: Take 50 mg by mouth every morning. ) 90 tablet 3  . Polyethyl Glycol-Propyl Glycol (SYSTANE ULTRA) 0.4-0.3 % SOLN Apply 1-2 drops to eye daily as needed (FOR DRY EYE RELIEF).    . saw palmetto 500 MG capsule Take 500 mg by mouth every morning.     . sotalol (BETAPACE) 160 MG tablet TAKE (1) TABLET BY MOUTH TWICE DAILY. (Patient taking differently: Take 160 mg by mouth 2 (two) times daily. ) 180 tablet 3  . traMADol (ULTRAM) 50 MG tablet Take 1 tablet (50 mg total) by mouth every 6 (six) hours as needed for severe pain. 25 tablet 0  . warfarin (COUMADIN) 5 MG tablet TAKE 1 TABLET BY MOUTH DAILY EXCEPT ON THURSDAYS TAKE 1 AND 1/2 TABLETS. 100 tablet 3   No current facility-administered medications for this visit.      Past Medical History:  Diagnosis Date  . AAA (abdominal aortic aneurysm) ([place])   . Arrhythmia   . Atrial fibrillation ([place])   . Back pain, chronic   . Coronary artery disease 1996 and 1997   angioplasty of LAD  . Diabetes mellitus without complication ([place])    MILD  TYPE 2  . High cholesterol   . Hypertension   . Left shoulder pain 2014  . Sleep apnea Sept. 2008   USES C-PAP    ROS:   All systems reviewed and negative except as noted in the HPI.   Past Surgical History:  Procedure Laterality Date  . CARDIAC CATHETERIZATION  1996  . CARDIAC CATHETERIZATION  06/18/1996   mid-circ tandem overlying stents with 20%,70%mid-LAD naroowin stent site in circ with 70% stenosis in the mid to distal marginal branch,tandem 95% and 80% stenosis in the proximal portion of the posterior descending branch of RCA   . CATARACT EXTRACTION  1983  . COLONOSCOPY N/A 09/04/2014   Procedure: COLONOSCOPY;  Surgeon: [name], MD;  Location: [facility];  Service: Endoscopy;  Laterality: N/A;  1030  . CORONARY ANGIOPLASTY WITH STENT PLACEMENT  08/18/1995   PTA/STENT CIRC  . CORONARY ANGIOPLASTY WITH STENT PLACEMENT  06/18/1996   PTCA  posterior descending  branch of the RCA 95 AND 80% TO LESSTHAN 20%  . [place]  . DOPPLER ECHOCARDIOGRAPHY  04/17/2010   EF =50-55%; mild concentric LVH,LAE, AORTIC SCLEROSIS.TRACE TO MILD CENTRAL AI, TRACE MITRAL REGURGITATION, CANNOT ASSESS DIASTOLIC FUNCTIONDUE TO UNDERLYING RHYTHM,PACERMAKER WIRE IN THE RA/RV  . DOPPLER ECHOCARDIOGRAPHY  04/21/2007   DONE IN [place]-- MILD MITRAL REGURG., MILD TRICUSPID REGURG. WITH MILD PULM. HTN, MILD CONCENTRIC LEFT VENTRICULAR HYPERTROPHY,MILD LEFT ATRIAL ENLARGEMENT.  . EYE SURGERY  1983   Cataract  . HERNIA REPAIR Bilateral 2000  . INGUINAL HERNIA REPAIR Right 12/13/2018   Procedure: HERNIA REPAIR INGUINAL ADULT WITH MESH;  Surgeon: Aviva Signs, MD;  Location: [facility];  Service: General;  Laterality: Right;  . INSERT / REPLACE / REMOVE PACEMAKER  SEPT 2008   INSERT DUAL -CHAMBER-medtronic adapta ADDR01 serial   [id number]  . NM MYOCAR MULTIPLE W/SPECT  04/17/2010   EF 50%; LOW NORMAL LV FUNCTION,  . PACEMAKER INSERTION  2008   DUAL-CHAMBER  . PPM GENERATOR  CHANGEOUT N/A 12/29/2016   Procedure: PPM Generator Changeout;  Surgeon: Evans Lance, MD;  Location: [place] [facility];  Service: Cardiovascular;  Laterality: N/A;     Family History  Problem Relation Age of Onset  . Peripheral vascular disease Mother   . Hypertension Mother   . Diabetes Mother   . Heart disease Mother   . Hyperlipidemia Mother   . Peripheral vascular disease Sister   . Cancer Sister   . Diabetes Sister   . Heart disease Sister        Stent- Heart Disease before age 80  . Hyperlipidemia Sister   . Hypertension Sister   . Peripheral vascular disease Brother   . Diabetes Brother   . Heart disease Brother        CHF and Heart Disease before age 21  . Hyperlipidemia Brother   . Hypertension Brother   . Stroke Father 28       TIA     Social History   Socioeconomic History  . Marital status: Married    Spouse name: Not on file  . Number of children: Not on file  . Years of education: Not on file  . Highest education level: Not on file  Occupational History  . Not on file  Social Needs  . Financial resource strain: Not on file  . Food insecurity    Worry: Not on file    Inability: Not on file  . Transportation needs    Medical: Not on file    Non-medical: Not on file  Tobacco Use  . Smoking status: Never Smoker  . Smokeless tobacco: Never Used  Substance and Sexual Activity  . Alcohol use: No    Alcohol/week: 0.0 standard drinks  . Drug use: No  . Sexual activity: Not on file  Lifestyle  . Physical activity    Days per week: Not on file    Minutes per session: Not on file  . Stress: Not on file  Relationships  . Social Herbalist on phone: Not on file    Gets together: Not on file    Attends religious service: Not on file    Active member of club or organization: Not on file    Attends meetings of clubs or organizations: Not on file    Relationship status: Not on file  . Intimate partner violence    Fear of current or ex  partner: Not on file    Emotionally abused: Not on file    Physically abused: Not on file    Forced sexual activity: Not on file  Other Topics Concern  . Not on file  Social History Narrative   Controls his diabetes through diet and exercise.     BP (!) 150/78  Pulse 85   Temp 97.8 F (36.6 C)   Ht 5\' 6"  (1.676 m)   Wt 150 lb (68 kg)   SpO2 98%   BMI 24.21 kg/m   Physical Exam:  Well appearing NAD HEENT: Unremarkable Neck:  6 cm JVD, no thyromegally Lymphatics:  No adenopathy Back:  No CVA tenderness Lungs:  Clear with no wheezes HEART:  Regular rate rhythm, no murmurs, no rubs, no clicks Abd:  soft, positive bowel sounds, no organomegally, no rebound, no guarding Ext:  2 plus pulses, no edema, no cyanosis, no clubbing Skin:  No rashes no nodules Neuro:  CN II through XII intact, motor grossly intact  DEVICE  Normal device function.  See PaceArt for details.   Assess/Plan: 1. PAF - he has had essentially no atrial fib since his last visit. 2. HTN - his SBP is up a bit but has been controlled at home.  3. PPM - his medtronic DDD PM is working normally.   Mikle Bosworth.D.

## 2019-05-15 NOTE — Patient Instructions (Signed)
Medication Instructions:  Your physician recommends that you continue on your current medications as directed. Please refer to the Current Medication list given to you today.  If you need a refill on your cardiac medications before your next appointment, please call your pharmacy.   Lab work: NONE  If you have labs (blood work) drawn today and your tests are completely normal, you will receive your results only by: . MyChart Message (if you have MyChart) OR . A paper copy in the mail If you have any lab test that is abnormal or we need to change your treatment, we will call you to review the results.  Testing/Procedures: NONE   Follow-Up: At [facility], you and your health needs are our priority.  As part of our continuing mission to provide you with exceptional heart care, we have created designated Provider Care Teams.  These Care Teams include your primary Cardiologist (physician) and Advanced Practice Providers (APPs -  Physician Assistants and Nurse Practitioners) who all work together to provide you with the care you need, when you need it. You will need a follow up appointment in 1 years.  Please call our office 2 months in advance to schedule this appointment.  You may see None or one of the following Advanced Practice Providers on your designated Care Team:   Amber Seiler, NP . Renee Ursuy, PA-C  Any Other Special Instructions Will Be Listed Below (If Applicable). Thank you for choosing [facility]!     

## 2019-06-06 ENCOUNTER — Other Ambulatory Visit: Payer: Self-pay

## 2019-06-06 ENCOUNTER — Encounter: Payer: Self-pay | Admitting: Adult Health

## 2019-06-06 ENCOUNTER — Ambulatory Visit (INDEPENDENT_AMBULATORY_CARE_PROVIDER_SITE_OTHER): Payer: Medicare Other | Admitting: Adult Health

## 2019-06-06 VITALS — BP 132/81 | HR 75 | Temp 98.2°F | Ht 66.0 in | Wt 155.6 lb

## 2019-06-06 DIAGNOSIS — Z9989 Dependence on other enabling machines and devices: Secondary | ICD-10-CM | POA: Diagnosis not present

## 2019-06-06 DIAGNOSIS — G4733 Obstructive sleep apnea (adult) (pediatric): Secondary | ICD-10-CM

## 2019-06-06 NOTE — Progress Notes (Signed)
PATIENT: Johnny Reynolds DOB: 1940-12-22  REASON FOR VISIT: follow up HISTORY FROM: patient  HISTORY OF PRESENT ILLNESS: Today 06/06/19:  [name] is a 78 year old male with a history of obstructive sleep apnea on CPAP.  His download indicates that he uses machine nightly for compliance of 100%.  He uses machine greater than 4 hours each night.  On average he uses his machine 7 hours and 19 minutes.  His residual AHI is 3.2 on 5 to 16 cm of water with EPR of 3.  His leak in the 95th percentile is 43.1 L/min.  HISTORY (Copied from Dr.Dohmeier's note) 01 June 2018, [name]. Johnny Reynolds is a 78 year old Caucasian gentleman who last year received a new pacemaker.  He has been a compliant CPAP patient ever since starting therapy and is using an AutoSet between 5 and 14 cmH2O with 3 cm pressure relief.  His residual AHI this year is slightly higher for the last 30 days at 6.4/h.  95th percentile pressure is 13.4 cmH2O and there are 19 minutes of Cheyne-Stokes respirations which is a new finding.   The average use at time of 7 hours and 23 minutes, he is highly compliant and he does not feel that his sleep quality has changed. He has not noted more air leaks, he has no aerophagia. I will need to lift the upper pressure window by 2 cm water.   REVIEW OF SYSTEMS: Out of a complete 14 system review of symptoms, the patient complains only of the following symptoms, and all other reviewed systems are negative.  ALLERGIES: Allergies  Allergen Reactions  . Fexofenadine Hcl Palpitations    Allegra    HOME MEDICATIONS: Outpatient Medications Prior to Visit  Medication Sig Dispense Refill  . atorvastatin (LIPITOR) 80 MG tablet TAKE ONE TABLET BY MOUTH DAILY. (Patient taking differently: Take 40 mg by mouth at bedtime. Takes half) 90 tablet 3  . diltiazem (CARDIZEM CD) 240 MG 24 hr capsule Take 240 mg by mouth at bedtime.     Marland Kitchen levothyroxine (SYNTHROID) 88 MCG tablet Take 88 mcg by mouth daily before  breakfast.     . losartan (COZAAR) 50 MG tablet TAKE 1 TABLET BY MOUTH ONCE DAILY. (Patient taking differently: Take 50 mg by mouth every morning. ) 90 tablet 3  . Polyethyl Glycol-Propyl Glycol (SYSTANE ULTRA) 0.4-0.3 % SOLN Apply 1-2 drops to eye daily as needed (FOR DRY EYE RELIEF).    . saw palmetto 500 MG capsule Take 500 mg by mouth 2 (two) times daily.     . sotalol (BETAPACE) 160 MG tablet TAKE (1) TABLET BY MOUTH TWICE DAILY. (Patient taking differently: Take 160 mg by mouth 2 (two) times daily. ) 180 tablet 3  . warfarin (COUMADIN) 5 MG tablet TAKE 1 TABLET BY MOUTH DAILY EXCEPT ON THURSDAYS TAKE 1 AND 1/2 TABLETS. (Patient taking differently: Take 5 mg by mouth daily. ) 100 tablet 3  . traMADol (ULTRAM) 50 MG tablet Take 1 tablet (50 mg total) by mouth every 6 (six) hours as needed for severe pain. (Patient not taking: Reported on 06/06/2019) 25 tablet 0   No facility-administered medications prior to visit.     PAST MEDICAL HISTORY: Past Medical History:  Diagnosis Date  . AAA (abdominal aortic aneurysm) ([place])   . Arrhythmia   . Atrial fibrillation ([place])   . Back pain, chronic   . Coronary artery disease 1996 and 1997   angioplasty of LAD  . Diabetes mellitus without complication (  [place])    MILD TYPE 2  . High cholesterol   . Hypertension   . Left shoulder pain 2014  . Sleep apnea Sept. 2008   USES C-PAP    PAST SURGICAL HISTORY: Past Surgical History:  Procedure Laterality Date  . CARDIAC CATHETERIZATION  1996  . CARDIAC CATHETERIZATION  06/18/1996   mid-circ tandem overlying stents with 20%,70%mid-LAD naroowin stent site in circ with 70% stenosis in the mid to distal marginal branch,tandem 95% and 80% stenosis in the proximal portion of the posterior descending branch of RCA   . CATARACT EXTRACTION  1983  . COLONOSCOPY N/A 09/04/2014   Procedure: COLONOSCOPY;  Surgeon: [name], MD;  [facility];  Service: Endoscopy;  Laterality: N/A;  1030  .  CORONARY ANGIOPLASTY WITH STENT PLACEMENT  08/18/1995   PTA/STENT CIRC  . CORONARY ANGIOPLASTY WITH STENT PLACEMENT  06/18/1996   PTCA  posterior descending  branch of the RCA 95 AND 80% TO LESSTHAN 20%  . [place]  . DOPPLER ECHOCARDIOGRAPHY  04/17/2010   EF =50-55%; mild concentric LVH,LAE, AORTIC SCLEROSIS.TRACE TO MILD CENTRAL AI, TRACE MITRAL REGURGITATION, CANNOT ASSESS DIASTOLIC FUNCTIONDUE TO UNDERLYING RHYTHM,PACERMAKER WIRE IN THE RA/RV  . DOPPLER ECHOCARDIOGRAPHY  04/21/2007   DONE IN NEW BERN-- MILD MITRAL REGURG., MILD TRICUSPID REGURG. WITH MILD PULM. HTN, MILD CONCENTRIC LEFT VENTRICULAR HYPERTROPHY,MILD LEFT ATRIAL ENLARGEMENT.  . EYE SURGERY  1983   Cataract  . HERNIA REPAIR Bilateral 2000  . INGUINAL HERNIA REPAIR Right 12/13/2018   Procedure: HERNIA REPAIR INGUINAL ADULT WITH MESH;  Surgeon: Aviva Signs, MD;  Location: [facility];  Service: General;  Laterality: Right;  . INSERT / REPLACE / REMOVE PACEMAKER  SEPT 2008   INSERT DUAL -CHAMBER-medtronic adapta ADDR01 serial   [id number]  . NM MYOCAR MULTIPLE W/SPECT  04/17/2010   EF 50%; LOW NORMAL LV FUNCTION,  . PACEMAKER INSERTION  2008   DUAL-CHAMBER  . PPM GENERATOR CHANGEOUT N/A 12/29/2016   Procedure: PPM Generator Changeout;  Surgeon: Evans Lance, MD;  Location: [place] [facility];  Service: Cardiovascular;  Laterality: N/A;    FAMILY HISTORY: Family History  Problem Relation Age of Onset  . Peripheral vascular disease Mother   . Hypertension Mother   . Diabetes Mother   . Heart disease Mother   . Hyperlipidemia Mother   . Peripheral vascular disease Sister   . Cancer Sister   . Diabetes Sister   . Heart disease Sister        Stent- Heart Disease before age 64  . Hyperlipidemia Sister   . Hypertension Sister   . Peripheral vascular disease Brother   . Diabetes Brother   . Heart disease Brother        CHF and Heart Disease before age 88  . Hyperlipidemia Brother   . Hypertension  Brother   . Stroke Father 39       TIA    SOCIAL HISTORY: Social History   Socioeconomic History  . Marital status: Married    Spouse name: Not on file  . Number of children: Not on file  . Years of education: Not on file  . Highest education level: Not on file  Occupational History  . Not on file  Social Needs  . Financial resource strain: Not on file  . Food insecurity    Worry: Not on file    Inability: Not on file  . Transportation needs    Medical: Not on file  Non-medical: Not on file  Tobacco Use  . Smoking status: Never Smoker  . Smokeless tobacco: Never Used  Substance and Sexual Activity  . Alcohol use: No    Alcohol/week: 0.0 standard drinks  . Drug use: No  . Sexual activity: Not on file  Lifestyle  . Physical activity    Days per week: Not on file    Minutes per session: Not on file  . Stress: Not on file  Relationships  . Social Herbalist on phone: Not on file    Gets together: Not on file    Attends religious service: Not on file    Active member of club or organization: Not on file    Attends meetings of clubs or organizations: Not on file    Relationship status: Not on file  . Intimate partner violence    Fear of current or ex partner: Not on file    Emotionally abused: Not on file    Physically abused: Not on file    Forced sexual activity: Not on file  Other Topics Concern  . Not on file  Social History Narrative   Controls his diabetes through diet and exercise.      PHYSICAL EXAM  Vitals:   06/06/19 0914  BP: 132/81  Pulse: 75  Temp: 98.2 F (36.8 C)  TempSrc: Oral  Weight: 155 lb 9.6 oz (70.6 kg)  Height: 5\' 6"  (1.676 m)   Body mass index is 25.11 kg/m. Generalized: Well developed, in no acute distress  Chest: Lungs clear to auscultation bilaterally  Neurological examination  Mentation: Alert oriented to time, place, history taking. Follows all commands speech and language fluent Cranial nerve II-XII:  Extraocular movements were full, visual field were full on confrontational test Head turning and shoulder shrug  were normal and symmetric. Motor: The motor testing reveals 5 over 5 strength of all 4 extremities. Good symmetric motor tone is noted throughout.  Sensory: Sensory testing is intact to soft touch on all 4 extremities. No evidence of extinction is noted.  Gait and station: Gait is normal.     DIAGNOSTIC DATA (LABS, IMAGING, TESTING) - I reviewed patient records, labs, notes, testing and imaging myself where available.  Lab Results  Component Value Date   WBC 5.6 12/12/2018   HGB 13.5 12/12/2018   HCT 41.4 12/12/2018   MCV 89.8 12/12/2018   PLT 164 12/12/2018      Component Value Date/Time   NA 136 12/12/2018 1404   K 4.2 12/12/2018 1404   CL 101 12/12/2018 1404   CO2 27 12/12/2018 1404   GLUCOSE 100 (H) 12/12/2018 1404   BUN 14 12/12/2018 1404   CREATININE 0.81 12/12/2018 1404   CREATININE 0.86 12/06/2017 1429   CALCIUM 9.1 12/12/2018 1404   PROT 5.9 (L) 12/06/2017 1429   ALBUMIN 4.4 07/03/2013 0914   AST 20 12/06/2017 1429   ALT 16 12/06/2017 1429   ALKPHOS 73 07/03/2013 0914   BILITOT 0.7 12/06/2017 1429   GFRNONAA >60 12/12/2018 1404   GFRAA >60 12/12/2018 1404   Lab Results  Component Value Date   CHOL 141 07/03/2013   HDL 45 07/03/2013   LDLCALC 81 07/03/2013   TRIG 77 07/03/2013   CHOLHDL 3.1 07/03/2013   No results found for: HGBA1C No results found for: VITAMINB12 No results found for: TSH    ASSESSMENT AND PLAN 78 y.o. year old male  has a past medical history of AAA (abdominal aortic aneurysm) ([place]), Arrhythmia,  Atrial fibrillation ([place]), Back pain, chronic, Coronary artery disease (1996 and 1997), Diabetes mellitus without complication ([place]), High cholesterol, Hypertension, Left shoulder pain (2014), and Sleep apnea (Sept. 2008). here with:  1. Obstructive sleep apnea on CPAP  The patient's CPAP download shows excellent compliance and good  treatment of his apnea.  He is encouraged to continue using CPAP nightly and greater than 4 hours each night.  He does have a high leak with his mask.  He will be sent for mask refitting.  He is also encouraged to change out his supplies at least every 3 months.  He is advised that if his symptoms worsen or he develops new symptoms he should let us know.  He will follow-up in 1 year or sooner if needed    I spent 15 minutes with the patient. 50% of this time was spent reviewing CPAP download   Ward Givens, MSN, NP-C 06/06/2019, 9:27 AM [facility] [address], [place], [place] [address] [phone]

## 2019-06-06 NOTE — Patient Instructions (Signed)
Continue using CPAP nightly and greater than 4 hours each night Mask refitting ordered. DME company will call you If your symptoms worsen or you develop new symptoms please let us know.

## 2019-06-26 ENCOUNTER — Other Ambulatory Visit: Payer: Self-pay

## 2019-06-26 ENCOUNTER — Ambulatory Visit (INDEPENDENT_AMBULATORY_CARE_PROVIDER_SITE_OTHER): Payer: Medicare Other | Admitting: *Deleted

## 2019-06-26 DIAGNOSIS — Z5181 Encounter for therapeutic drug level monitoring: Secondary | ICD-10-CM | POA: Diagnosis not present

## 2019-06-26 DIAGNOSIS — I4891 Unspecified atrial fibrillation: Secondary | ICD-10-CM | POA: Diagnosis not present

## 2019-06-26 LAB — POCT INR: INR: 2.9 (ref 2.0–3.0)

## 2019-06-26 NOTE — Patient Instructions (Signed)
Continue warfarin 1 tablet daily  Recheck in 6 weeks.  

## 2019-07-16 LAB — CUP PACEART REMOTE DEVICE CHECK
Battery Remaining Longevity: 124 mo
Battery Voltage: 3.01 V
Brady Statistic AP VP Percent: 0.05 %
Brady Statistic AP VS Percent: 99.27 %
Brady Statistic AS VP Percent: 0 %
Brady Statistic AS VS Percent: 0.69 %
Brady Statistic RA Percent Paced: 99.32 %
Brady Statistic RV Percent Paced: 0.05 %
Date Time Interrogation Session: 20201123081056
Implantable Lead Implant Date: 20080902
Implantable Lead Implant Date: 20080902
Implantable Lead Location: 753859
Implantable Lead Location: 753860
Implantable Lead Model: 5076
Implantable Lead Model: 5076
Implantable Pulse Generator Implant Date: 20180509
Lead Channel Impedance Value: 361 Ohm
Lead Channel Impedance Value: 361 Ohm
Lead Channel Impedance Value: 437 Ohm
Lead Channel Impedance Value: 494 Ohm
Lead Channel Pacing Threshold Amplitude: 0.75 V
Lead Channel Pacing Threshold Amplitude: 0.875 V
Lead Channel Pacing Threshold Pulse Width: 0.4 ms
Lead Channel Pacing Threshold Pulse Width: 0.4 ms
Lead Channel Sensing Intrinsic Amplitude: 1.125 mV
Lead Channel Sensing Intrinsic Amplitude: 1.125 mV
Lead Channel Sensing Intrinsic Amplitude: 21.125 mV
Lead Channel Sensing Intrinsic Amplitude: 21.125 mV
Lead Channel Setting Pacing Amplitude: 2 V
Lead Channel Setting Pacing Amplitude: 2.5 V
Lead Channel Setting Pacing Pulse Width: 0.4 ms
Lead Channel Setting Sensing Sensitivity: 0.9 mV

## 2019-07-18 ENCOUNTER — Ambulatory Visit (INDEPENDENT_AMBULATORY_CARE_PROVIDER_SITE_OTHER): Payer: Medicare Other | Admitting: *Deleted

## 2019-07-18 DIAGNOSIS — I495 Sick sinus syndrome: Secondary | ICD-10-CM

## 2019-07-31 ENCOUNTER — Other Ambulatory Visit: Payer: Self-pay | Admitting: Cardiology

## 2019-08-07 ENCOUNTER — Other Ambulatory Visit: Payer: Self-pay

## 2019-08-07 ENCOUNTER — Ambulatory Visit (INDEPENDENT_AMBULATORY_CARE_PROVIDER_SITE_OTHER): Payer: Medicare Other | Admitting: *Deleted

## 2019-08-07 DIAGNOSIS — I4891 Unspecified atrial fibrillation: Secondary | ICD-10-CM

## 2019-08-07 DIAGNOSIS — Z5181 Encounter for therapeutic drug level monitoring: Secondary | ICD-10-CM | POA: Diagnosis not present

## 2019-08-07 LAB — POCT INR: INR: 3.1 — AB (ref 2.0–3.0)

## 2019-08-07 NOTE — Patient Instructions (Signed)
Continue warfarin 1 tablet daily  Recheck in 6 weeks.  

## 2019-08-14 NOTE — Progress Notes (Signed)
PPM remote 

## 2019-08-21 ENCOUNTER — Encounter (INDEPENDENT_AMBULATORY_CARE_PROVIDER_SITE_OTHER): Payer: Self-pay | Admitting: *Deleted

## 2019-09-18 ENCOUNTER — Other Ambulatory Visit: Payer: Self-pay

## 2019-09-18 ENCOUNTER — Ambulatory Visit (INDEPENDENT_AMBULATORY_CARE_PROVIDER_SITE_OTHER): Payer: Medicare Other | Admitting: *Deleted

## 2019-09-18 DIAGNOSIS — Z5181 Encounter for therapeutic drug level monitoring: Secondary | ICD-10-CM | POA: Diagnosis not present

## 2019-09-18 DIAGNOSIS — I4891 Unspecified atrial fibrillation: Secondary | ICD-10-CM

## 2019-09-18 LAB — POCT INR: INR: 2.3 (ref 2.0–3.0)

## 2019-09-18 NOTE — Patient Instructions (Signed)
Continue warfarin 1 tablet daily  Recheck in 6 weeks.  

## 2019-10-01 ENCOUNTER — Other Ambulatory Visit: Payer: Self-pay | Admitting: Cardiology

## 2019-10-17 ENCOUNTER — Ambulatory Visit (INDEPENDENT_AMBULATORY_CARE_PROVIDER_SITE_OTHER): Payer: Medicare Other | Admitting: *Deleted

## 2019-10-17 DIAGNOSIS — I495 Sick sinus syndrome: Secondary | ICD-10-CM | POA: Diagnosis not present

## 2019-10-17 LAB — CUP PACEART REMOTE DEVICE CHECK
Battery Remaining Longevity: 121 mo
Battery Voltage: 3.01 V
Brady Statistic AP VP Percent: 0.17 %
Brady Statistic AP VS Percent: 99.58 %
Brady Statistic AS VP Percent: 0 %
Brady Statistic AS VS Percent: 0.25 %
Brady Statistic RA Percent Paced: 99.74 %
Brady Statistic RV Percent Paced: 0.17 %
Date Time Interrogation Session: 20210224003551
Implantable Lead Implant Date: 20080902
Implantable Lead Implant Date: 20080902
Implantable Lead Location: 753859
Implantable Lead Location: 753860
Implantable Lead Model: 5076
Implantable Lead Model: 5076
Implantable Pulse Generator Implant Date: 20180509
Lead Channel Impedance Value: 342 Ohm
Lead Channel Impedance Value: 361 Ohm
Lead Channel Impedance Value: 437 Ohm
Lead Channel Impedance Value: 494 Ohm
Lead Channel Pacing Threshold Amplitude: 0.625 V
Lead Channel Pacing Threshold Amplitude: 0.875 V
Lead Channel Pacing Threshold Pulse Width: 0.4 ms
Lead Channel Pacing Threshold Pulse Width: 0.4 ms
Lead Channel Sensing Intrinsic Amplitude: 1 mV
Lead Channel Sensing Intrinsic Amplitude: 1 mV
Lead Channel Sensing Intrinsic Amplitude: 20.125 mV
Lead Channel Sensing Intrinsic Amplitude: 20.125 mV
Lead Channel Setting Pacing Amplitude: 2 V
Lead Channel Setting Pacing Amplitude: 2.5 V
Lead Channel Setting Pacing Pulse Width: 0.4 ms
Lead Channel Setting Sensing Sensitivity: 0.9 mV

## 2019-10-18 NOTE — Progress Notes (Signed)
PPM Remote  

## 2019-10-27 ENCOUNTER — Other Ambulatory Visit: Payer: Self-pay | Admitting: Cardiology

## 2019-10-30 ENCOUNTER — Encounter: Payer: Self-pay | Admitting: *Deleted

## 2019-11-06 ENCOUNTER — Other Ambulatory Visit: Payer: Self-pay

## 2019-11-06 ENCOUNTER — Ambulatory Visit (INDEPENDENT_AMBULATORY_CARE_PROVIDER_SITE_OTHER): Payer: Medicare Other | Admitting: *Deleted

## 2019-11-06 DIAGNOSIS — I4891 Unspecified atrial fibrillation: Secondary | ICD-10-CM | POA: Diagnosis not present

## 2019-11-06 DIAGNOSIS — Z5181 Encounter for therapeutic drug level monitoring: Secondary | ICD-10-CM

## 2019-11-06 LAB — POCT INR: INR: 2.5 (ref 2.0–3.0)

## 2019-11-06 NOTE — Patient Instructions (Signed)
Continue warfarin 1 tablet daily  Recheck in 6 weeks.  

## 2019-11-14 ENCOUNTER — Other Ambulatory Visit: Payer: Self-pay | Admitting: Internal Medicine

## 2019-11-15 NOTE — Telephone Encounter (Signed)
This is a [place] pt.  °

## 2019-12-18 ENCOUNTER — Other Ambulatory Visit: Payer: Self-pay

## 2019-12-18 ENCOUNTER — Ambulatory Visit (INDEPENDENT_AMBULATORY_CARE_PROVIDER_SITE_OTHER): Payer: Medicare Other | Admitting: *Deleted

## 2019-12-18 DIAGNOSIS — I4891 Unspecified atrial fibrillation: Secondary | ICD-10-CM | POA: Diagnosis not present

## 2019-12-18 DIAGNOSIS — Z5181 Encounter for therapeutic drug level monitoring: Secondary | ICD-10-CM | POA: Diagnosis not present

## 2019-12-18 LAB — POCT INR: INR: 2.3 (ref 2.0–3.0)

## 2019-12-18 NOTE — Patient Instructions (Signed)
Continue warfarin 1 tablet daily  Recheck in 6 weeks.  

## 2020-01-14 LAB — CUP PACEART REMOTE DEVICE CHECK
Battery Remaining Longevity: 118 mo
Battery Voltage: 3.01 V
Brady Statistic AP VP Percent: 0.05 %
Brady Statistic AP VS Percent: 99.54 %
Brady Statistic AS VP Percent: 0 %
Brady Statistic AS VS Percent: 0.41 %
Brady Statistic RA Percent Paced: 99.43 %
Brady Statistic RV Percent Paced: 0.23 %
Date Time Interrogation Session: 20210524074618
Implantable Lead Implant Date: 20080902
Implantable Lead Implant Date: 20080902
Implantable Lead Location: 753859
Implantable Lead Location: 753860
Implantable Lead Model: 5076
Implantable Lead Model: 5076
Implantable Pulse Generator Implant Date: 20180509
Lead Channel Impedance Value: 361 Ohm
Lead Channel Impedance Value: 380 Ohm
Lead Channel Impedance Value: 475 Ohm
Lead Channel Impedance Value: 494 Ohm
Lead Channel Pacing Threshold Amplitude: 0.75 V
Lead Channel Pacing Threshold Amplitude: 0.875 V
Lead Channel Pacing Threshold Pulse Width: 0.4 ms
Lead Channel Pacing Threshold Pulse Width: 0.4 ms
Lead Channel Sensing Intrinsic Amplitude: 1.125 mV
Lead Channel Sensing Intrinsic Amplitude: 1.125 mV
Lead Channel Sensing Intrinsic Amplitude: 20.125 mV
Lead Channel Sensing Intrinsic Amplitude: 20.125 mV
Lead Channel Setting Pacing Amplitude: 2 V
Lead Channel Setting Pacing Amplitude: 2.5 V
Lead Channel Setting Pacing Pulse Width: 0.4 ms
Lead Channel Setting Sensing Sensitivity: 0.9 mV

## 2020-01-16 ENCOUNTER — Ambulatory Visit (INDEPENDENT_AMBULATORY_CARE_PROVIDER_SITE_OTHER): Payer: Medicare Other | Admitting: *Deleted

## 2020-01-16 DIAGNOSIS — I495 Sick sinus syndrome: Secondary | ICD-10-CM

## 2020-01-17 NOTE — Progress Notes (Signed)
Remote pacemaker transmission.   

## 2020-01-26 ENCOUNTER — Other Ambulatory Visit: Payer: Self-pay | Admitting: Cardiology

## 2020-01-30 ENCOUNTER — Other Ambulatory Visit: Payer: Self-pay | Admitting: Cardiology

## 2020-01-30 ENCOUNTER — Other Ambulatory Visit: Payer: Self-pay

## 2020-01-30 ENCOUNTER — Ambulatory Visit (INDEPENDENT_AMBULATORY_CARE_PROVIDER_SITE_OTHER): Payer: Medicare Other | Admitting: Pharmacist

## 2020-01-30 DIAGNOSIS — Z5181 Encounter for therapeutic drug level monitoring: Secondary | ICD-10-CM | POA: Diagnosis not present

## 2020-01-30 DIAGNOSIS — I4891 Unspecified atrial fibrillation: Secondary | ICD-10-CM | POA: Diagnosis not present

## 2020-01-30 LAB — POCT INR: INR: 2.5 (ref 2.0–3.0)

## 2020-01-30 NOTE — Patient Instructions (Signed)
Description   ?Continue warfarin 1 tablet daily ?Recheck in 6 weeks ?  ? ? ?

## 2020-02-13 ENCOUNTER — Other Ambulatory Visit: Payer: Self-pay | Admitting: Internal Medicine

## 2020-02-14 NOTE — Telephone Encounter (Signed)
This is a [place] pt.  °

## 2020-02-28 ENCOUNTER — Other Ambulatory Visit: Payer: Self-pay | Admitting: *Deleted

## 2020-02-28 DIAGNOSIS — I714 Abdominal aortic aneurysm, without rupture, unspecified: Secondary | ICD-10-CM

## 2020-03-06 ENCOUNTER — Ambulatory Visit (HOSPITAL_COMMUNITY)
Admission: RE | Admit: 2020-03-06 | Discharge: 2020-03-06 | Disposition: A | Discharge status: Home / Self Care | Payer: Medicare Other | Source: Ambulatory Visit | Attending: Vascular Surgery | Admitting: Vascular Surgery

## 2020-03-06 ENCOUNTER — Ambulatory Visit (INDEPENDENT_AMBULATORY_CARE_PROVIDER_SITE_OTHER): Payer: Medicare Other | Admitting: Physician Assistant

## 2020-03-06 ENCOUNTER — Other Ambulatory Visit: Payer: Self-pay

## 2020-03-06 VITALS — BP 154/83 | HR 74 | Temp 97.8°F | Resp 20 | Ht 66.0 in | Wt 156.1 lb

## 2020-03-06 DIAGNOSIS — I714 Abdominal aortic aneurysm, without rupture, unspecified: Secondary | ICD-10-CM

## 2020-03-06 NOTE — Progress Notes (Signed)
HISTORY AND PHYSICAL     CC:  follow up. Requesting Provider:  Sharilyn Sites, MD  HPI: This is a 79 y.o. male who is here today for follow up for AAA.  He was seen in November 2019 and at that time, he was doing well with good BP control.  His AAA measured 3.0cm.  He had a popliteal artery duplex that revealed no popliteal aneurysms.    The pt returns today for follow up.  He states he has been doing well.  He exercises 3x/week at the gym on the treadmill for 30 minutes at a 2% incline.  He does not get any pain in his legs while walking.  He has not had any back or abdominal pain.   Since his last visit here, he had an inguinal hernia repair.    The pt is on a statin for cholesterol management.    The pt is not on an aspirin.    Other AC:  Coumadin for afib The pt is on CCB, ARB for hypertension.  The pt does have diabetes. Tobacco hx:  never   Past Medical History:  Diagnosis Date  . AAA (abdominal aortic aneurysm) ([place])   . Arrhythmia   . Atrial fibrillation ([place])   . Back pain, chronic   . Coronary artery disease 1996 and 1997   angioplasty of LAD  . Diabetes mellitus without complication ([place])    MILD TYPE 2  . High cholesterol   . Hypertension   . Left shoulder pain 2014  . Sleep apnea Sept. 2008   USES C-PAP    Past Surgical History:  Procedure Laterality Date  . CARDIAC CATHETERIZATION  1996  . CARDIAC CATHETERIZATION  06/18/1996   mid-circ tandem overlying stents with 20%,70%mid-LAD naroowin stent site in circ with 70% stenosis in the mid to distal marginal branch,tandem 95% and 80% stenosis in the proximal portion of the posterior descending branch of RCA   . CATARACT EXTRACTION  1983  . COLONOSCOPY N/A 09/04/2014   Procedure: COLONOSCOPY;  Surgeon: [name], MD;  [facility];  Service: Endoscopy;  Laterality: N/A;  1030  . CORONARY ANGIOPLASTY WITH STENT PLACEMENT  08/18/1995   PTA/STENT CIRC  . CORONARY ANGIOPLASTY WITH STENT PLACEMENT   06/18/1996   PTCA  posterior descending  branch of the RCA 95 AND 80% TO LESSTHAN 20%  . [place]  . DOPPLER ECHOCARDIOGRAPHY  04/17/2010   EF =50-55%; mild concentric LVH,LAE, AORTIC SCLEROSIS.TRACE TO MILD CENTRAL AI, TRACE MITRAL REGURGITATION, CANNOT ASSESS DIASTOLIC FUNCTIONDUE TO UNDERLYING RHYTHM,PACERMAKER WIRE IN THE RA/RV  . DOPPLER ECHOCARDIOGRAPHY  04/21/2007   DONE IN [place]-- MILD MITRAL REGURG., MILD TRICUSPID REGURG. WITH MILD PULM. HTN, MILD CONCENTRIC LEFT VENTRICULAR HYPERTROPHY,MILD LEFT ATRIAL ENLARGEMENT.  . EYE SURGERY  1983   Cataract  . HERNIA REPAIR Bilateral 2000  . INGUINAL HERNIA REPAIR Right 12/13/2018   Procedure: HERNIA REPAIR INGUINAL ADULT WITH MESH;  Surgeon: Aviva Signs, MD;  Location: [facility];  Service: General;  Laterality: Right;  . INSERT / REPLACE / REMOVE PACEMAKER  SEPT 2008   INSERT DUAL -CHAMBER-medtronic adapta ADDR01 serial   [id number]  . NM MYOCAR MULTIPLE W/SPECT  04/17/2010   EF 50%; LOW NORMAL LV FUNCTION,  . PACEMAKER INSERTION  2008   DUAL-CHAMBER  . PPM GENERATOR CHANGEOUT N/A 12/29/2016   Procedure: PPM Generator Changeout;  Surgeon: Evans Lance, MD;  Location: [place] [facility];  Service: Cardiovascular;  Laterality:  N/A;    Allergies  Allergen Reactions  . Fexofenadine Hcl Palpitations    Allegra    Current Outpatient Medications  Medication Sig Dispense Refill  . atorvastatin (LIPITOR) 80 MG tablet TAKE ONE TABLET BY MOUTH DAILY. 90 tablet 1  . diltiazem (CARDIZEM CD) 240 MG 24 hr capsule Take 240 mg by mouth at bedtime.     Regino Schultze TEST test strip USE TO TEST BLOOD SUGARATWICE DAILY.    Marland Kitchen ivermectin (STROMECTOL) 3 MG TABS tablet     . Lancets (STERILANCE TL) MISC USE TO TEST BLOOG SUGARWTWICE DAILY.    Marland Kitchen levothyroxine (SYNTHROID) 88 MCG tablet Take 88 mcg by mouth daily before breakfast.     . losartan (COZAAR) 50 MG tablet TAKE 1 TABLET BY MOUTH ONCE DAILY. 90 tablet 0  . Polyethyl  Glycol-Propyl Glycol (SYSTANE ULTRA) 0.4-0.3 % SOLN Apply 1-2 drops to eye daily as needed (FOR DRY EYE RELIEF).    . saw palmetto 500 MG capsule Take 500 mg by mouth 2 (two) times daily.     . sotalol (BETAPACE) 160 MG tablet TAKE (1) TABLET BY MOUTH TWICE DAILY. 180 tablet 2  . vitamin C (ASCORBIC ACID) 250 MG tablet Take 250 mg by mouth daily.    Marland Kitchen warfarin (COUMADIN) 5 MG tablet TAKE 1 TABLET BY MOUTH DAILY EXCEPT ON THURSDAYS TAKE 1 AND 1/2 TABLETS. (Patient taking differently: Take 5 mg by mouth daily. ) 100 tablet 3  . zinc gluconate 50 MG tablet      No current facility-administered medications for this visit.    Family History  Problem Relation Age of Onset  . Peripheral vascular disease Mother   . Hypertension Mother   . Diabetes Mother   . Heart disease Mother   . Hyperlipidemia Mother   . Peripheral vascular disease Sister   . Cancer Sister   . Diabetes Sister   . Heart disease Sister        Stent- Heart Disease before age 50  . Hyperlipidemia Sister   . Hypertension Sister   . Peripheral vascular disease Brother   . Diabetes Brother   . Heart disease Brother        CHF and Heart Disease before age 67  . Hyperlipidemia Brother   . Hypertension Brother   . Stroke Father 47       TIA    Social History   Socioeconomic History  . Marital status: Married    Spouse name: Not on file  . Number of children: Not on file  . Years of education: Not on file  . Highest education level: Not on file  Occupational History  . Not on file  Tobacco Use  . Smoking status: Never Smoker  . Smokeless tobacco: Never Used  Vaping Use  . Vaping Use: Never used  Substance and Sexual Activity  . Alcohol use: No    Alcohol/week: 0.0 standard drinks  . Drug use: No  . Sexual activity: Not on file  Other Topics Concern  . Not on file  Social History Narrative   Controls his diabetes through diet and exercise.   Social Determinants of Health   Financial Resource Strain:   .  Difficulty of Paying Living Expenses:   Food Insecurity:   . Worried About Charity fundraiser in the Last Year:   . Arboriculturist in the Last Year:   Transportation Needs:   . Film/video editor (Medical):   Marland Kitchen Lack of Transportation (  Non-Medical):   Physical Activity:   . Days of Exercise per Week:   . Minutes of Exercise per Session:   Stress:   . Feeling of Stress :   Social Connections:   . Frequency of Communication with Friends and Family:   . Frequency of Social Gatherings with Friends and Family:   . Attends Religious Services:   . Active Member of Clubs or Organizations:   . Attends Archivist Meetings:   Marland Kitchen Marital Status:   Intimate Partner Violence:   . Fear of Current or Ex-Partner:   . Emotionally Abused:   Marland Kitchen Physically Abused:   . Sexually Abused:      REVIEW OF SYSTEMS:   [X]  denotes positive finding, [ ]  denotes negative finding Cardiac  Comments:  Chest pain or chest pressure:    Shortness of breath upon exertion:    Short of breath when lying flat:    Irregular heart rhythm:        Vascular    Pain in calf, thigh, or hip brought on by ambulation:    Pain in feet at night that wakes you up from your sleep:     Blood clot in your veins:    Leg swelling:         Pulmonary    Oxygen at home:    Productive cough:     Wheezing:         Neurologic    Sudden weakness in arms or legs:     Sudden numbness in arms or legs:     Sudden onset of difficulty speaking or slurred speech:    Temporary loss of vision in one eye:     Problems with dizziness:         Gastrointestinal    Blood in stool:     Vomited blood:         Genitourinary    Burning when urinating:     Blood in urine:        Psychiatric    Major depression:         Hematologic    Bleeding problems:    Problems with blood clotting too easily:        Skin    Rashes or ulcers:        Constitutional    Fever or chills:      PHYSICAL EXAMINATION:  Today's Vitals    03/06/20 0845  BP: (!) 154/83  Pulse: 74  Resp: 20  Temp: 97.8 F (36.6 C)  TempSrc: Temporal  SpO2: 96%  Weight: 156 lb 1.6 oz (70.8 kg)  Height: 5\' 6"  (1.676 m)   Body mass index is 25.2 kg/m.   General:  WDWN in NAD; vital signs documented above Gait: Not observed HENT: WNL, normocephalic Pulmonary: normal non-labored breathing , without wheezing Cardiac: regular HR, without  Murmur; without carotid bruits Abdomen: soft, NT, no masses Skin: without rashes Vascular Exam/Pulses:  Right Left  Radial 2+ (normal) 2+ (normal)  Popliteal Unable to palpate  Unable to palpate   DP 2+ (normal) 2+ (normal)  PT 3+ (hyperdynamic) 3+ (hyperdynamic)   Extremities: without ischemic changes, without Gangrene , without cellulitis; without open wounds;  Musculoskeletal: no muscle wasting or atrophy  Neurologic: A&O X 3;  No focal weakness or paresthesias are detected Psychiatric:  The pt has Normal affect.   Non-Invasive Vascular Imaging:   AAA duplex 03/06/2020: Abdominal Aorta Findings:  +-----------+-------+----------+----------+--------+--------+--------+  Location  AP (cm)Trans (cm)PSV (cm/s)WaveformThrombusComments  +-----------+-------+----------+----------+--------+--------+--------+  Proximal  2.32  2.29   60                  +-----------+-------+----------+----------+--------+--------+--------+  Mid    2.16  1.96   71                  +-----------+-------+----------+----------+--------+--------+--------+  Distal   3.01  2.97   28                  +-----------+-------+----------+----------+--------+--------+--------+  RT CIA Prox1.1  1.0    79                  +-----------+-------+----------+----------+--------+--------+--------+  LT CIA Prox1.1  1.2    71                    +-----------+-------+----------+----------+--------+--------+--------+   Summary:  Abdominal Aorta: There is evidence of abnormal dilatation of the distal  Abdominal aorta. Proximal aorta appears ectatic. The largest aortic  diameter remains essentially unchanged compared to prior exam. Previous  diameter measurement was 3.03 x 2.99 cm  obtained on 07/06/18   ASSESSMENT/PLAN:: 79 y.o. male here for follow up for follow up for AAA.    -pt remains asymptomatic without abdominal or back pain.  His AAA is essentially unchanged from November 2019.  Given that it is 3cm and hasn't really changed, he will f/u in 3 years with AAA duplex.  Discussed with him that should he develop severe sudden onset of abdominal or back pain, he should proceed to ER.  Discussed he has a low risk of rupture but this risk is not zero.   -continue statin.  Pt not on asa as he is on coumadin for afib.   Leontine Locket, [facility] [phone]  Clinic MD:   Oneida Alar

## 2020-03-12 ENCOUNTER — Ambulatory Visit (INDEPENDENT_AMBULATORY_CARE_PROVIDER_SITE_OTHER): Payer: Medicare Other | Admitting: *Deleted

## 2020-03-12 DIAGNOSIS — Z5181 Encounter for therapeutic drug level monitoring: Secondary | ICD-10-CM | POA: Diagnosis not present

## 2020-03-12 DIAGNOSIS — I4891 Unspecified atrial fibrillation: Secondary | ICD-10-CM

## 2020-03-12 LAB — POCT INR: INR: 2.9 (ref 2.0–3.0)

## 2020-03-12 NOTE — Patient Instructions (Signed)
Continue warfarin 1 tablet daily  Recheck in 6 weeks.  

## 2020-03-30 ENCOUNTER — Other Ambulatory Visit: Payer: Self-pay | Admitting: Internal Medicine

## 2020-04-16 ENCOUNTER — Ambulatory Visit (INDEPENDENT_AMBULATORY_CARE_PROVIDER_SITE_OTHER): Payer: Medicare Other | Admitting: *Deleted

## 2020-04-16 DIAGNOSIS — R001 Bradycardia, unspecified: Secondary | ICD-10-CM | POA: Diagnosis not present

## 2020-04-17 LAB — CUP PACEART REMOTE DEVICE CHECK
Battery Remaining Longevity: 116 mo
Battery Voltage: 3 V
Brady Statistic AP VP Percent: 0.04 %
Brady Statistic AP VS Percent: 99.15 %
Brady Statistic AS VP Percent: 0 %
Brady Statistic AS VS Percent: 0.8 %
Brady Statistic RA Percent Paced: 99.2 %
Brady Statistic RV Percent Paced: 0.05 %
Date Time Interrogation Session: 20210824073843
Implantable Lead Implant Date: 20080902
Implantable Lead Implant Date: 20080902
Implantable Lead Location: 753859
Implantable Lead Location: 753860
Implantable Lead Model: 5076
Implantable Lead Model: 5076
Implantable Pulse Generator Implant Date: 20180509
Lead Channel Impedance Value: 380 Ohm
Lead Channel Impedance Value: 399 Ohm
Lead Channel Impedance Value: 456 Ohm
Lead Channel Impedance Value: 532 Ohm
Lead Channel Pacing Threshold Amplitude: 0.75 V
Lead Channel Pacing Threshold Amplitude: 1 V
Lead Channel Pacing Threshold Pulse Width: 0.4 ms
Lead Channel Pacing Threshold Pulse Width: 0.4 ms
Lead Channel Sensing Intrinsic Amplitude: 0.625 mV
Lead Channel Sensing Intrinsic Amplitude: 0.625 mV
Lead Channel Sensing Intrinsic Amplitude: 21.875 mV
Lead Channel Sensing Intrinsic Amplitude: 21.875 mV
Lead Channel Setting Pacing Amplitude: 2 V
Lead Channel Setting Pacing Amplitude: 2.5 V
Lead Channel Setting Pacing Pulse Width: 0.4 ms
Lead Channel Setting Sensing Sensitivity: 0.9 mV

## 2020-04-22 ENCOUNTER — Ambulatory Visit (INDEPENDENT_AMBULATORY_CARE_PROVIDER_SITE_OTHER): Payer: Medicare Other | Admitting: *Deleted

## 2020-04-22 DIAGNOSIS — I4891 Unspecified atrial fibrillation: Secondary | ICD-10-CM | POA: Diagnosis not present

## 2020-04-22 DIAGNOSIS — Z5181 Encounter for therapeutic drug level monitoring: Secondary | ICD-10-CM

## 2020-04-22 LAB — POCT INR: INR: 4.1 — AB (ref 2.0–3.0)

## 2020-04-22 NOTE — Patient Instructions (Signed)
Had Covid 7/22  Hold warfarin tonight then resume 1 tablet daily   Recheck in 3 weeks.

## 2020-04-22 NOTE — Progress Notes (Signed)
Remote pacemaker transmission.   

## 2020-05-12 ENCOUNTER — Ambulatory Visit (INDEPENDENT_AMBULATORY_CARE_PROVIDER_SITE_OTHER): Payer: Medicare Other | Admitting: *Deleted

## 2020-05-12 DIAGNOSIS — Z5181 Encounter for therapeutic drug level monitoring: Secondary | ICD-10-CM

## 2020-05-12 DIAGNOSIS — I4891 Unspecified atrial fibrillation: Secondary | ICD-10-CM

## 2020-05-12 LAB — POCT INR: INR: 3.2 — AB (ref 2.0–3.0)

## 2020-05-12 NOTE — Patient Instructions (Signed)
Had Covid 7/22  Decrease warfarin to 1 tablet daily except 1/2 tablet on Mondays Recheck in 4 weeks.

## 2020-06-05 ENCOUNTER — Encounter: Payer: Self-pay | Admitting: Neurology

## 2020-06-05 ENCOUNTER — Ambulatory Visit (INDEPENDENT_AMBULATORY_CARE_PROVIDER_SITE_OTHER): Payer: Medicare Other | Admitting: Neurology

## 2020-06-05 VITALS — BP 150/84 | HR 79 | Ht 65.5 in | Wt 152.0 lb

## 2020-06-05 DIAGNOSIS — G4733 Obstructive sleep apnea (adult) (pediatric): Secondary | ICD-10-CM | POA: Diagnosis not present

## 2020-06-05 DIAGNOSIS — Z9989 Dependence on other enabling machines and devices: Secondary | ICD-10-CM

## 2020-06-05 DIAGNOSIS — Z8616 Personal history of COVID-19: Secondary | ICD-10-CM | POA: Diagnosis not present

## 2020-06-05 NOTE — Patient Instructions (Signed)

## 2020-06-05 NOTE — Progress Notes (Addendum)
PATIENT: Johnny Reynolds DOB: 05-Aug-1941  REASON FOR VISIT: follow up HISTORY FROM: patient  HISTORY OF PRESENT ILLNESS: Today 06/05/20:  Johnny Reynolds, a meanwhile 79 year -old caucasian male, who has been using CPAP.  79 y.o. year old male  has a past medical history of AAA (abdominal aortic aneurysm) ([place]), Arrhythmia, Atrial fibrillation ([place]), Back pain, chronic, Coronary artery disease (1996 and 1997), Diabetes mellitus without complication ([place]), High cholesterol, Hypertension, Left shoulder pain (2014), and Sleep apnea (Sept. 2008). He has contracted Covid 19 in July 2021, and he took Ivermectin. He is convinced it helped. He remained a CPAP user with high compliance.  He has been 100% compliant user by days and time with an average use at time of 7 hours and 47 minutes at night he is using an AutoSet with a minimum pressure setting of 5 and a maximum pressure setting of 16 cmH2O and 3 cmH2O expiratory pressure relief.  His residual AHI is 1.4, this is an excellent resolution of his apnea.  He does have some air leakage and his 95th percentile pressure is at 12 cmH2O well within the currently covered pressure range.  He does not have central apneas or Cheyne-Stokes respirations.  He endorsed the Epworth sleepiness score at 0 points is not excessively daytime sleepy and he does not endorse any significant degree of fatigue.     Johnny Reynolds is a 79 year old male with a history of obstructive sleep apnea on CPAP.  His download indicates that he uses machine nightly for compliance of 100%.  He uses machine greater than 4 hours each night.  On average he uses his machine 7 hours and 19 minutes.  His residual AHI is 3.2 on 5 to 16 cm of water with EPR of 3.  His leak in the 95th percentile is 43.1 L/min.  HISTORY (Copied from Dr.Lunna Vogelgesang's note) 01 June 2018, Johnny Reynolds. Max is a 79 year old Caucasian gentleman who last year received a new pacemaker.  He has been a compliant CPAP patient  ever since starting therapy and is using an AutoSet between 5 and 14 cmH2O with 3 cm pressure relief.  His residual AHI this year is slightly higher for the last 30 days at 6.4/h.  95th percentile pressure is 13.4 cmH2O and there are 19 minutes of Cheyne-Stokes respirations which is a new finding.   The average use at time of 7 hours and 23 minutes, he is highly compliant and he does not feel that his sleep quality has changed. He has not noted more air leaks, he has no aerophagia. I will need to lift the upper pressure window by 2 cm water.   REVIEW OF SYSTEMS: Out of a complete 14 system review of symptoms, the patient complains only of the following symptoms, and all other reviewed systems are negative.  How likely are you to doze in the following situations: 0 = not likely, 1 = slight chance, 2 = moderate chance, 3 = high chance  Sitting and Reading? Watching Television? Sitting inactive in a public place (theater or meeting)? Lying down in the afternoon when circumstances permit? Sitting and talking to someone? Sitting quietly after lunch without alcohol? In a car, while stopped for a few minutes in traffic? As a passenger in a car for an hour without a break?  Total = 0  FSS at 9/ 63.     ALLERGIES: Allergies  Allergen Reactions  . Fexofenadine Hcl Palpitations    Allegra  HOME MEDICATIONS: Outpatient Medications Prior to Visit  Medication Sig Dispense Refill  . Ascorbic Acid (VITAMIN C) 1000 MG tablet     . atorvastatin (LIPITOR) 80 MG tablet TAKE ONE TABLET BY MOUTH DAILY. 90 tablet 1  . Cholecalciferol (VITAMIN D3) 50 MCG (2000 UT) CAPS Take 2,000 Units by mouth daily.    Marland Kitchen diltiazem (CARDIZEM CD) 240 MG 24 hr capsule Take 240 mg by mouth at bedtime.     Regino Schultze TEST test strip USE TO TEST BLOOD SUGARATWICE DAILY.    Marland Kitchen Lancets (STERILANCE TL) MISC USE TO TEST BLOOG SUGARWTWICE DAILY.    Marland Kitchen levothyroxine (SYNTHROID) 88 MCG tablet Take 88 mcg by mouth daily before  breakfast.     . losartan (COZAAR) 50 MG tablet TAKE 1 TABLET BY MOUTH ONCE DAILY. 90 tablet 0  . Polyethyl Glycol-Propyl Glycol (SYSTANE ULTRA) 0.4-0.3 % SOLN Apply 1-2 drops to eye daily as needed (FOR DRY EYE RELIEF).    . saw palmetto 500 MG capsule Take 500 mg by mouth 2 (two) times daily.     . sotalol (BETAPACE) 160 MG tablet TAKE (1) TABLET BY MOUTH TWICE DAILY. 180 tablet 2  . warfarin (COUMADIN) 5 MG tablet TAKE 1 TABLET BY MOUTH DAILY OR AS DIRECTED 100 tablet 4  . zinc gluconate 50 MG tablet     . ivermectin (STROMECTOL) 3 MG TABS tablet     . vitamin C (ASCORBIC ACID) 250 MG tablet Take 250 mg by mouth daily.     No facility-administered medications prior to visit.    PAST MEDICAL HISTORY: Past Medical History:  Diagnosis Date  . AAA (abdominal aortic aneurysm) ([place])   . Arrhythmia   . Atrial fibrillation ([place])   . Back pain, chronic   . Coronary artery disease 1996 and 1997   angioplasty of LAD  . Diabetes mellitus without complication ([place])    MILD TYPE 2  . High cholesterol   . Hypertension   . Left shoulder pain 2014  . Sleep apnea Sept. 2008   USES C-PAP    PAST SURGICAL HISTORY: Past Surgical History:  Procedure Laterality Date  . CARDIAC CATHETERIZATION  1996  . CARDIAC CATHETERIZATION  06/18/1996   mid-circ tandem overlying stents with 20%,70%mid-LAD naroowin stent site in circ with 70% stenosis in the mid to distal marginal branch,tandem 95% and 80% stenosis in the proximal portion of the posterior descending branch of RCA   . CATARACT EXTRACTION  1983  . COLONOSCOPY N/A 09/04/2014   Procedure: COLONOSCOPY;  Surgeon: [name], MD;  [facility];  Service: Endoscopy;  Laterality: N/A;  1030  . CORONARY ANGIOPLASTY WITH STENT PLACEMENT  08/18/1995   PTA/STENT CIRC  . CORONARY ANGIOPLASTY WITH STENT PLACEMENT  06/18/1996   PTCA  posterior descending  branch of the RCA 95 AND 80% TO LESSTHAN 20%  . [place]  .  DOPPLER ECHOCARDIOGRAPHY  04/17/2010   EF =50-55%; mild concentric LVH,LAE, AORTIC SCLEROSIS.TRACE TO MILD CENTRAL AI, TRACE MITRAL REGURGITATION, CANNOT ASSESS DIASTOLIC FUNCTIONDUE TO UNDERLYING RHYTHM,PACERMAKER WIRE IN THE RA/RV  . DOPPLER ECHOCARDIOGRAPHY  04/21/2007   DONE IN NEW BERN-- MILD MITRAL REGURG., MILD TRICUSPID REGURG. WITH MILD PULM. HTN, MILD CONCENTRIC LEFT VENTRICULAR HYPERTROPHY,MILD LEFT ATRIAL ENLARGEMENT.  . EYE SURGERY  1983   Cataract  . HERNIA REPAIR Bilateral 2000  . INGUINAL HERNIA REPAIR Right 12/13/2018   Procedure: HERNIA REPAIR INGUINAL ADULT WITH MESH;  Surgeon: Aviva Signs, MD;  Location:  [facility];  Service: General;  Laterality: Right;  . INSERT / REPLACE / REMOVE PACEMAKER  SEPT 2008   INSERT DUAL -CHAMBER-medtronic adapta ADDR01 serial   [id number]  . NM MYOCAR MULTIPLE W/SPECT  04/17/2010   EF 50%; LOW NORMAL LV FUNCTION,  . PACEMAKER INSERTION  2008   DUAL-CHAMBER  . PPM GENERATOR CHANGEOUT N/A 12/29/2016   Procedure: PPM Generator Changeout;  Surgeon: Evans Lance, MD;  Location: [facility];  Service: Cardiovascular;  Laterality: N/A;    FAMILY HISTORY: Family History  Problem Relation Age of Onset  . Peripheral vascular disease Mother   . Hypertension Mother   . Diabetes Mother   . Heart disease Mother   . Hyperlipidemia Mother   . Peripheral vascular disease Sister   . Cancer Sister   . Diabetes Sister   . Heart disease Sister        Stent- Heart Disease before age 60  . Hyperlipidemia Sister   . Hypertension Sister   . Peripheral vascular disease Brother   . Diabetes Brother   . Heart disease Brother        CHF and Heart Disease before age 76  . Hyperlipidemia Brother   . Hypertension Brother   . Stroke Father 76       TIA    SOCIAL HISTORY: Social History   Socioeconomic History  . Marital status: Married    Spouse name: Not on file  . Number of children: Not on file  . Years of education: Not on file  . Highest  education level: Not on file  Occupational History  . Not on file  Tobacco Use  . Smoking status: Never Smoker  . Smokeless tobacco: Never Used  Vaping Use  . Vaping Use: Never used  Substance and Sexual Activity  . Alcohol use: No    Alcohol/week: 0.0 standard drinks  . Drug use: No  . Sexual activity: Not on file  Other Topics Concern  . Not on file  Social History Narrative   Controls his diabetes through diet and exercise.   Social Determinants of Health   Financial Resource Strain:   . Difficulty of Paying Living Expenses: Not on file  Food Insecurity:   . Worried About Charity fundraiser in the Last Year: Not on file  . Ran Out of Food in the Last Year: Not on file  Transportation Needs:   . Lack of Transportation (Medical): Not on file  . Lack of Transportation (Non-Medical): Not on file  Physical Activity:   . Days of Exercise per Week: Not on file  . Minutes of Exercise per Session: Not on file  Stress:   . Feeling of Stress : Not on file  Social Connections:   . Frequency of Communication with Friends and Family: Not on file  . Frequency of Social Gatherings with Friends and Family: Not on file  . Attends Religious Services: Not on file  . Active Member of Clubs or Organizations: Not on file  . Attends Archivist Meetings: Not on file  . Marital Status: Not on file  Intimate Partner Violence:   . Fear of Current or Ex-Partner: Not on file  . Emotionally Abused: Not on file  . Physically Abused: Not on file  . Sexually Abused: Not on file      PHYSICAL EXAM  Vitals:   06/05/20 0923  BP: (!) 150/84  Pulse: 79  Weight: 152 lb (68.9 kg)  Height: 5' 5.5" (  1.664 m)   Body mass index is 24.91 kg/m.    Neck size - 15" ,  [place] ; 2- tip of the uvula on tongue ground, no redness, no edema.   Generalized: Well developed, in no acute distress, well groomed.   Chest: Lungs clear to auscultation bilaterally  Neurological examination    Mentation: Alert oriented to time, place, history taking. Follows all commands speech and language fluent Cranial nerve : had loss of smell and taste on March 13 2020- have returned.  Extraocular movements were full, visual field were full on confrontational test Head turning and shoulder shrug  were normal and symmetric. Motor: The motor testing reveals full strength of all 4 extremities. Good symmetric motor tone is noted throughout.  Sensory: intact to vibration and soft touch on all 4 extremities.  No evidence of extinction is noted.  Gait and station: Gait is intact. DTR trace in both patella,      DIAGNOSTIC DATA (LABS, IMAGING, TESTING) - I reviewed patient records, labs, notes, testing and imaging myself where available.  Lab Results  Component Value Date   WBC 5.6 12/12/2018   HGB 13.5 12/12/2018   HCT 41.4 12/12/2018   MCV 89.8 12/12/2018   PLT 164 12/12/2018      Component Value Date/Time   NA 136 12/12/2018 1404   K 4.2 12/12/2018 1404   CL 101 12/12/2018 1404   CO2 27 12/12/2018 1404   GLUCOSE 100 (H) 12/12/2018 1404   BUN 14 12/12/2018 1404   CREATININE 0.81 12/12/2018 1404   CREATININE 0.86 12/06/2017 1429   CALCIUM 9.1 12/12/2018 1404   PROT 5.9 (L) 12/06/2017 1429   ALBUMIN 4.4 07/03/2013 0914   AST 20 12/06/2017 1429   ALT 16 12/06/2017 1429   ALKPHOS 73 07/03/2013 0914   BILITOT 0.7 12/06/2017 1429   GFRNONAA >60 12/12/2018 1404   GFRAA >60 12/12/2018 1404   Lab Results  Component Value Date   CHOL 141 07/03/2013   HDL 45 07/03/2013   LDLCALC 81 07/03/2013   TRIG 77 07/03/2013   CHOLHDL 3.1 07/03/2013   No results found for: HGBA1C No results found for: VITAMINB12 No results found for: TSH    ASSESSMENT AND PLAN   here with:  1. Obstructive sleep apnea on CPAP 2. Covid 19 , recovered.  3. Atrial fibrillation.   The patient's CPAP download shows excellent compliance and good treatment of his apnea.  He is encouraged to continue using  CPAP nightly and greater than 4 hours each night.  He does have a high leak with his mask.  He will be sent for mask refitting.  He is also encouraged to change out his supplies at least every 3 months.  He is advised that if his symptoms worsen or he develops new symptoms he should let us know.  He will follow-up in 1 year or sooner if needed   I spent 25 minutes with the patient. 50% of this time was spent reviewing CPAP download, discussing the vaccination for COVID ( he declined) and not taking ivermectin or plaquenil without a doctor following him.   Larey Seat, MD   06/05/2020, 9:40 AM [facility] [address], [place], [place] [address] [phone]

## 2020-06-09 ENCOUNTER — Ambulatory Visit (INDEPENDENT_AMBULATORY_CARE_PROVIDER_SITE_OTHER): Payer: Medicare Other | Admitting: *Deleted

## 2020-06-09 DIAGNOSIS — Z5181 Encounter for therapeutic drug level monitoring: Secondary | ICD-10-CM

## 2020-06-09 DIAGNOSIS — I4891 Unspecified atrial fibrillation: Secondary | ICD-10-CM

## 2020-06-09 LAB — POCT INR: INR: 2 (ref 2.0–3.0)

## 2020-06-09 NOTE — Patient Instructions (Signed)
Had Covid 7/22  Continue warfarin 1 tablet daily except 1/2 tablet on Mondays Recheck in 4 weeks.

## 2020-07-10 ENCOUNTER — Ambulatory Visit (INDEPENDENT_AMBULATORY_CARE_PROVIDER_SITE_OTHER): Payer: Medicare Other | Admitting: *Deleted

## 2020-07-10 DIAGNOSIS — Z5181 Encounter for therapeutic drug level monitoring: Secondary | ICD-10-CM | POA: Diagnosis not present

## 2020-07-10 DIAGNOSIS — I4891 Unspecified atrial fibrillation: Secondary | ICD-10-CM | POA: Diagnosis not present

## 2020-07-10 LAB — POCT INR: INR: 1.7 — AB (ref 2.0–3.0)

## 2020-07-10 NOTE — Patient Instructions (Signed)
Had Covid 7/22  Take warfarin 1 1/2 tablet tonight then increase dose to 1 tablet daily  Recheck in 3 weeks.

## 2020-07-16 ENCOUNTER — Ambulatory Visit (INDEPENDENT_AMBULATORY_CARE_PROVIDER_SITE_OTHER): Payer: Medicare Other

## 2020-07-16 DIAGNOSIS — I495 Sick sinus syndrome: Secondary | ICD-10-CM | POA: Diagnosis not present

## 2020-07-16 LAB — CUP PACEART REMOTE DEVICE CHECK
Battery Remaining Longevity: 111 mo
Battery Voltage: 3 V
Brady Statistic AP VP Percent: 0.04 %
Brady Statistic AP VS Percent: 99.69 %
Brady Statistic AS VP Percent: 0 %
Brady Statistic AS VS Percent: 0.27 %
Brady Statistic RA Percent Paced: 99.68 %
Brady Statistic RV Percent Paced: 0.09 %
Date Time Interrogation Session: 20211124003752
Implantable Lead Implant Date: 20080902
Implantable Lead Implant Date: 20080902
Implantable Lead Location: 753859
Implantable Lead Location: 753860
Implantable Lead Model: 5076
Implantable Lead Model: 5076
Implantable Pulse Generator Implant Date: 20180509
Lead Channel Impedance Value: 361 Ohm
Lead Channel Impedance Value: 361 Ohm
Lead Channel Impedance Value: 456 Ohm
Lead Channel Impedance Value: 494 Ohm
Lead Channel Pacing Threshold Amplitude: 0.625 V
Lead Channel Pacing Threshold Amplitude: 0.875 V
Lead Channel Pacing Threshold Pulse Width: 0.4 ms
Lead Channel Pacing Threshold Pulse Width: 0.4 ms
Lead Channel Sensing Intrinsic Amplitude: 1 mV
Lead Channel Sensing Intrinsic Amplitude: 1 mV
Lead Channel Sensing Intrinsic Amplitude: 21.875 mV
Lead Channel Sensing Intrinsic Amplitude: 21.875 mV
Lead Channel Setting Pacing Amplitude: 2 V
Lead Channel Setting Pacing Amplitude: 2.5 V
Lead Channel Setting Pacing Pulse Width: 0.4 ms
Lead Channel Setting Sensing Sensitivity: 0.9 mV

## 2020-07-23 ENCOUNTER — Other Ambulatory Visit: Payer: Self-pay | Admitting: Cardiology

## 2020-07-24 NOTE — Progress Notes (Signed)
Remote pacemaker transmission.   

## 2020-07-25 ENCOUNTER — Ambulatory Visit (INDEPENDENT_AMBULATORY_CARE_PROVIDER_SITE_OTHER): Payer: Medicare Other | Admitting: Internal Medicine

## 2020-07-25 ENCOUNTER — Encounter: Payer: Self-pay | Admitting: Internal Medicine

## 2020-07-25 ENCOUNTER — Other Ambulatory Visit: Payer: Self-pay

## 2020-07-25 VITALS — BP 164/82 | HR 75 | Ht 65.5 in | Wt 161.4 lb

## 2020-07-25 DIAGNOSIS — I495 Sick sinus syndrome: Secondary | ICD-10-CM

## 2020-07-25 LAB — CUP PACEART INCLINIC DEVICE CHECK
Battery Remaining Longevity: 113 mo
Battery Voltage: 3 V
Brady Statistic AP VP Percent: 0.07 %
Brady Statistic AP VS Percent: 99.47 %
Brady Statistic AS VP Percent: 0 %
Brady Statistic AS VS Percent: 0.46 %
Brady Statistic RA Percent Paced: 99.5 %
Brady Statistic RV Percent Paced: 0.12 %
Date Time Interrogation Session: 20211203141820
Implantable Lead Implant Date: 20080902
Implantable Lead Implant Date: 20080902
Implantable Lead Location: 753859
Implantable Lead Location: 753860
Implantable Lead Model: 5076
Implantable Lead Model: 5076
Implantable Pulse Generator Implant Date: 20180509
Lead Channel Impedance Value: 361 Ohm
Lead Channel Impedance Value: 399 Ohm
Lead Channel Impedance Value: 494 Ohm
Lead Channel Impedance Value: 551 Ohm
Lead Channel Pacing Threshold Amplitude: 0.25 V
Lead Channel Pacing Threshold Amplitude: 0.25 V
Lead Channel Pacing Threshold Pulse Width: 0.4 ms
Lead Channel Pacing Threshold Pulse Width: 0.4 ms
Lead Channel Sensing Intrinsic Amplitude: 1.4 mV
Lead Channel Sensing Intrinsic Amplitude: 23.25 mV
Lead Channel Setting Pacing Amplitude: 2 V
Lead Channel Setting Pacing Amplitude: 2.5 V
Lead Channel Setting Pacing Pulse Width: 0.4 ms
Lead Channel Setting Sensing Sensitivity: 0.9 mV

## 2020-07-25 NOTE — Patient Instructions (Signed)
Medication Instructions:  Your physician recommends that you continue on your current medications as directed. Please refer to the Current Medication list given to you today.  *If you need a refill on your cardiac medications before your next appointment, please call your pharmacy*   Lab Work: NONE   If you have labs (blood work) drawn today and your tests are completely normal, you will receive your results only by: . MyChart Message (if you have MyChart) OR . A paper copy in the mail If you have any lab test that is abnormal or we need to change your treatment, we will call you to review the results.   Testing/Procedures: NONE    Follow-Up: At [facility], you and your health needs are our priority.  As part of our continuing mission to provide you with exceptional heart care, we have created designated Provider Care Teams.  These Care Teams include your primary Cardiologist (physician) and Advanced Practice Providers (APPs -  Physician Assistants and Nurse Practitioners) who all work together to provide you with the care you need, when you need it.  We recommend signing up for the patient portal called "MyChart".  Sign up information is provided on this After Visit Summary.  MyChart is used to connect with patients for Virtual Visits (Telemedicine).  Patients are able to view lab/test results, encounter notes, upcoming appointments, etc.  Non-urgent messages can be sent to your provider as well.   To learn more about what you can do with MyChart, go to [email or web address].    Your next appointment:   1 year(s)  The format for your next appointment:   In Person  Provider:   Gregg Taylor, MD   Other Instructions Thank you for choosing [facility]!    

## 2020-07-25 NOTE — Progress Notes (Addendum)
HPI [name] returns today for followup. He is a pleasant 79 yo man with a h/o sinus node dysfunction, PAF, s/p PPM insertion. He has not had any additional bleeding. He feels well although he admits to being not as active. 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 Since his last visit, he has been treated for Covid 19. He denies chest pain or sob or edema.  Allergies  Allergen Reactions  . Fexofenadine Hcl Palpitations    Allegra     Current Outpatient Medications  Medication Sig Dispense Refill  . Ascorbic Acid (VITAMIN C) 1000 MG tablet 500 mg.     . atorvastatin (LIPITOR) 80 MG tablet TAKE ONE TABLET BY MOUTH DAILY. 90 tablet 1  . Cholecalciferol (VITAMIN D3) 50 MCG (2000 UT) CAPS Take 2,000 Units by mouth daily.    Marland Kitchen diltiazem (CARDIZEM CD) 240 MG 24 hr capsule Take 240 mg by mouth at bedtime.     Regino Schultze TEST test strip USE TO TEST BLOOD SUGARATWICE DAILY.    Marland Kitchen Lancets (STERILANCE TL) MISC USE TO TEST BLOOG SUGARWTWICE DAILY.    Marland Kitchen levothyroxine (SYNTHROID) 88 MCG tablet Take 88 mcg by mouth daily before breakfast.     . losartan (COZAAR) 50 MG tablet TAKE 1 TABLET BY MOUTH ONCE DAILY. 90 tablet 3  . Polyethyl Glycol-Propyl Glycol (SYSTANE ULTRA) 0.4-0.3 % SOLN Apply 1-2 drops to eye daily as needed (FOR DRY EYE RELIEF).    . saw palmetto 500 MG capsule Take 500 mg by mouth 2 (two) times daily.     . sotalol (BETAPACE) 160 MG tablet TAKE (1) TABLET BY MOUTH TWICE DAILY. 180 tablet 2  . warfarin (COUMADIN) 5 MG tablet TAKE 1 TABLET BY MOUTH DAILY OR AS DIRECTED 100 tablet 4  . zinc gluconate 50 MG tablet 25 mg daily.      No current facility-administered medications for this visit.     Past Medical History:  Diagnosis Date  . AAA (abdominal aortic aneurysm) ([place])   . Arrhythmia   . Atrial fibrillation ([place])   . Back pain, chronic   . Coronary artery disease 1996 and 1997   angioplasty of LAD  . Diabetes mellitus without complication ([place])     MILD TYPE 2  . High cholesterol   . Hypertension   . Left shoulder pain 2014  . Sleep apnea Sept. 2008   USES C-PAP    ROS:   All systems reviewed and negative except as noted in the HPI.   Past Surgical History:  Procedure Laterality Date  . CARDIAC CATHETERIZATION  1996  . CARDIAC CATHETERIZATION  06/18/1996   mid-circ tandem overlying stents with 20%,70%mid-LAD naroowin stent site in circ with 70% stenosis in the mid to distal marginal branch,tandem 95% and 80% stenosis in the proximal portion of the posterior descending branch of RCA   . CATARACT EXTRACTION  1983  . COLONOSCOPY N/A 09/04/2014   Procedure: COLONOSCOPY;  Surgeon: [name], MD;  [facility];  Service: Endoscopy;  Laterality: N/A;  1030  . CORONARY ANGIOPLASTY WITH STENT PLACEMENT  08/18/1995   PTA/STENT CIRC  . CORONARY ANGIOPLASTY WITH STENT PLACEMENT  06/18/1996   PTCA  posterior descending  branch of the RCA 95 AND 80% TO LESSTHAN 20%  . [place]  . DOPPLER ECHOCARDIOGRAPHY  04/17/2010   EF =50-55%; mild concentric LVH,LAE, AORTIC SCLEROSIS.TRACE TO MILD CENTRAL AI, TRACE MITRAL REGURGITATION, CANNOT ASSESS  DIASTOLIC FUNCTIONDUE TO UNDERLYING RHYTHM,PACERMAKER WIRE IN THE RA/RV  . DOPPLER ECHOCARDIOGRAPHY  04/21/2007   DONE IN [place] B[place]-- MILD MITRAL REGURG., MILD TRICUSPID REGURG. WITH MILD PULM. HTN, MILD CONCENTRIC LEFT VENTRICULAR HYPERTROPHY,MILD LEFT ATRIAL ENLARGEMENT.  . EYE SURGERY  1983   Cataract  . HERNIA REPAIR Bilateral 2000  . INGUINAL HERNIA REPAIR Right 12/13/2018   Procedure: HERNIA REPAIR INGUINAL ADULT WITH MESH;  Surgeon: Aviva Signs, MD;  Location: [facility];  Service: General;  Laterality: Right;  . INSERT / REPLACE / REMOVE PACEMAKER  SEPT 2008   INSERT DUAL -CHAMBER-medtronic adapta ADDR01 serial   [id number]  . NM MYOCAR MULTIPLE W/SPECT  04/17/2010   EF 50%; LOW NORMAL LV FUNCTION,  . PACEMAKER INSERTION  2008   DUAL-CHAMBER  . PPM GENERATOR  CHANGEOUT N/A 12/29/2016   Procedure: PPM Generator Changeout;  Surgeon: Evans Lance, MD;  Location: [facility];  Service: Cardiovascular;  Laterality: N/A;     Family History  Problem Relation Age of Onset  . Peripheral vascular disease Mother   . Hypertension Mother   . Diabetes Mother   . Heart disease Mother   . Hyperlipidemia Mother   . Peripheral vascular disease Sister   . Cancer Sister   . Diabetes Sister   . Heart disease Sister        Stent- Heart Disease before age 38  . Hyperlipidemia Sister   . Hypertension Sister   . Peripheral vascular disease Brother   . Diabetes Brother   . Heart disease Brother        CHF and Heart Disease before age 51  . Hyperlipidemia Brother   . Hypertension Brother   . Stroke Father 68       TIA     Social History   Socioeconomic History  . Marital status: Married    Spouse name: Not on file  . Number of children: Not on file  . Years of education: Not on file  . Highest education level: Not on file  Occupational History  . Not on file  Tobacco Use  . Smoking status: Never Smoker  . Smokeless tobacco: Never Used  Vaping Use  . Vaping Use: Never used  Substance and Sexual Activity  . Alcohol use: No    Alcohol/week: 0.0 standard drinks  . Drug use: No  . Sexual activity: Not on file  Other Topics Concern  . Not on file  Social History Narrative   Controls his diabetes through diet and exercise.   Social Determinants of Health   Financial Resource Strain:   . Difficulty of Paying Living Expenses: Not on file  Food Insecurity:   . Worried About Charity fundraiser in the Last Year: Not on file  . Ran Out of Food in the Last Year: Not on file  Transportation Needs:   . Lack of Transportation (Medical): Not on file  . Lack of Transportation (Non-Medical): Not on file  Physical Activity:   . Days of Exercise per Week: Not on file  . Minutes of Exercise per Session: Not on file  Stress:   . Feeling of  Stress : Not on file  Social Connections:   . Frequency of Communication with Friends and Family: Not on file  . Frequency of Social Gatherings with Friends and Family: Not on file  . Attends Religious Services: Not on file  . Active Member of Clubs or Organizations: Not on file  . Attends Archivist Meetings:  Not on file  . Marital Status: Not on file  Intimate Partner Violence:   . Fear of Current or Ex-Partner: Not on file  . Emotionally Abused: Not on file  . Physically Abused: Not on file  . Sexually Abused: Not on file     BP (!) 164/82   Pulse 75   Ht 5' 5.5" (1.664 m)   Wt 161 lb 6.4 oz (73.2 kg)   SpO2 97%   BMI 26.45 kg/m   Physical Exam:  Well appearing NAD HEENT: Unremarkable Neck:  No JVD, no thyromegally Lymphatics:  No adenopathy Back:  No CVA tenderness Lungs:  Clear with no wheezes HEART:  Regular rate rhythm, no murmurs, no rubs, no clicks Abd:  soft, positive bowel sounds, no organomegally, no rebound, no guarding Ext:  2 plus pulses, no edema, no cyanosis, no clubbing Skin:  No rashes no nodules Neuro:  CN II through XII intact, motor grossly intact  EKG  - nsr with atrial pacing  DEVICE  Normal device function.  See PaceArt for details.   Assess/Plan: 1. PAF - he is maintaining NSR over 99.9% of the time. 2. HTN - his bp is well controlled. 3. Sinus node dysfunction - he is atrial pacing over 99.5% of the time and is asymptomatic.  4. 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  Carleene Overlie Rodell Marrs,MD

## 2020-07-31 ENCOUNTER — Ambulatory Visit (INDEPENDENT_AMBULATORY_CARE_PROVIDER_SITE_OTHER): Payer: Medicare Other | Admitting: *Deleted

## 2020-07-31 DIAGNOSIS — Z5181 Encounter for therapeutic drug level monitoring: Secondary | ICD-10-CM

## 2020-07-31 DIAGNOSIS — I4891 Unspecified atrial fibrillation: Secondary | ICD-10-CM | POA: Diagnosis not present

## 2020-07-31 LAB — POCT INR: INR: 2 (ref 2.0–3.0)

## 2020-07-31 NOTE — Patient Instructions (Signed)
Continue warfarin 1 tablet daily.   Recheck in 4 weeks   

## 2020-08-06 ENCOUNTER — Other Ambulatory Visit: Payer: Self-pay | Admitting: Cardiology

## 2020-08-28 ENCOUNTER — Ambulatory Visit (INDEPENDENT_AMBULATORY_CARE_PROVIDER_SITE_OTHER): Payer: Medicare Other | Admitting: *Deleted

## 2020-08-28 DIAGNOSIS — Z5181 Encounter for therapeutic drug level monitoring: Secondary | ICD-10-CM

## 2020-08-28 DIAGNOSIS — I4891 Unspecified atrial fibrillation: Secondary | ICD-10-CM

## 2020-08-28 LAB — POCT INR: INR: 3 (ref 2.0–3.0)

## 2020-08-28 NOTE — Patient Instructions (Signed)
Continue warfarin 1 tablet daily Recheck in 5 weeks 

## 2020-10-02 ENCOUNTER — Ambulatory Visit (INDEPENDENT_AMBULATORY_CARE_PROVIDER_SITE_OTHER): Payer: Medicare Other | Admitting: *Deleted

## 2020-10-02 DIAGNOSIS — Z5181 Encounter for therapeutic drug level monitoring: Secondary | ICD-10-CM | POA: Diagnosis not present

## 2020-10-02 DIAGNOSIS — I4891 Unspecified atrial fibrillation: Secondary | ICD-10-CM

## 2020-10-02 LAB — POCT INR: INR: 2.2 (ref 2.0–3.0)

## 2020-10-02 NOTE — Patient Instructions (Signed)
Continue warfarin 1 tablet daily  Recheck in 6 weeks.  

## 2020-10-15 ENCOUNTER — Ambulatory Visit (INDEPENDENT_AMBULATORY_CARE_PROVIDER_SITE_OTHER): Payer: Medicare Other

## 2020-10-15 DIAGNOSIS — I4821 Permanent atrial fibrillation: Secondary | ICD-10-CM

## 2020-10-16 LAB — CUP PACEART REMOTE DEVICE CHECK
Battery Remaining Longevity: 108 mo
Battery Voltage: 3 V
Brady Statistic AP VP Percent: 0.04 %
Brady Statistic AP VS Percent: 99.52 %
Brady Statistic AS VP Percent: 0 %
Brady Statistic AS VS Percent: 0.44 %
Brady Statistic RA Percent Paced: 99.57 %
Brady Statistic RV Percent Paced: 0.05 %
Date Time Interrogation Session: 20220223074203
Implantable Lead Implant Date: 20080902
Implantable Lead Implant Date: 20080902
Implantable Lead Location: 753859
Implantable Lead Location: 753860
Implantable Lead Model: 5076
Implantable Lead Model: 5076
Implantable Pulse Generator Implant Date: 20180509
Lead Channel Impedance Value: 361 Ohm
Lead Channel Impedance Value: 361 Ohm
Lead Channel Impedance Value: 437 Ohm
Lead Channel Impedance Value: 494 Ohm
Lead Channel Pacing Threshold Amplitude: 0.625 V
Lead Channel Pacing Threshold Amplitude: 0.875 V
Lead Channel Pacing Threshold Pulse Width: 0.4 ms
Lead Channel Pacing Threshold Pulse Width: 0.4 ms
Lead Channel Sensing Intrinsic Amplitude: 1.25 mV
Lead Channel Sensing Intrinsic Amplitude: 1.25 mV
Lead Channel Sensing Intrinsic Amplitude: 22.5 mV
Lead Channel Sensing Intrinsic Amplitude: 22.5 mV
Lead Channel Setting Pacing Amplitude: 2 V
Lead Channel Setting Pacing Amplitude: 2.5 V
Lead Channel Setting Pacing Pulse Width: 0.4 ms
Lead Channel Setting Sensing Sensitivity: 0.9 mV

## 2020-10-23 NOTE — Progress Notes (Signed)
Remote pacemaker transmission.   

## 2020-11-12 ENCOUNTER — Other Ambulatory Visit: Payer: Self-pay | Admitting: Internal Medicine

## 2020-11-13 ENCOUNTER — Ambulatory Visit (INDEPENDENT_AMBULATORY_CARE_PROVIDER_SITE_OTHER): Payer: Medicare Other | Admitting: Pharmacist

## 2020-11-13 DIAGNOSIS — I4891 Unspecified atrial fibrillation: Secondary | ICD-10-CM | POA: Diagnosis not present

## 2020-11-13 DIAGNOSIS — Z5181 Encounter for therapeutic drug level monitoring: Secondary | ICD-10-CM | POA: Diagnosis not present

## 2020-11-13 LAB — POCT INR: INR: 2.7 (ref 2.0–3.0)

## 2020-11-13 NOTE — Patient Instructions (Signed)
Description   Continue warfarin 1 tablet daily  Recheck in 6 weeks.

## 2020-12-29 DIAGNOSIS — H04221 Epiphora due to insufficient drainage, right lacrimal gland: Secondary | ICD-10-CM | POA: Insufficient documentation

## 2020-12-29 DIAGNOSIS — H04551 Acquired stenosis of right nasolacrimal duct: Secondary | ICD-10-CM | POA: Insufficient documentation

## 2021-01-01 ENCOUNTER — Ambulatory Visit (INDEPENDENT_AMBULATORY_CARE_PROVIDER_SITE_OTHER): Payer: Medicare Other | Admitting: *Deleted

## 2021-01-01 DIAGNOSIS — Z5181 Encounter for therapeutic drug level monitoring: Secondary | ICD-10-CM

## 2021-01-01 DIAGNOSIS — I4891 Unspecified atrial fibrillation: Secondary | ICD-10-CM

## 2021-01-01 LAB — POCT INR: INR: 2.3 (ref 2.0–3.0)

## 2021-01-01 NOTE — Patient Instructions (Signed)
Continue warfarin 1 tablet daily  Recheck in 6 weeks.  

## 2021-02-16 ENCOUNTER — Other Ambulatory Visit: Payer: Self-pay

## 2021-02-16 ENCOUNTER — Ambulatory Visit (INDEPENDENT_AMBULATORY_CARE_PROVIDER_SITE_OTHER): Payer: Medicare Other | Admitting: *Deleted

## 2021-02-16 DIAGNOSIS — I4891 Unspecified atrial fibrillation: Secondary | ICD-10-CM

## 2021-02-16 DIAGNOSIS — Z5181 Encounter for therapeutic drug level monitoring: Secondary | ICD-10-CM | POA: Diagnosis not present

## 2021-02-16 LAB — POCT INR: INR: 1.9 — AB (ref 2.0–3.0)

## 2021-02-16 NOTE — Patient Instructions (Signed)
Take warfarin 1 1/2 tablets tonight then resume 1 tablet daily  Recheck in 6 weeks.

## 2021-03-30 ENCOUNTER — Ambulatory Visit (INDEPENDENT_AMBULATORY_CARE_PROVIDER_SITE_OTHER): Payer: Medicare Other | Admitting: *Deleted

## 2021-03-30 DIAGNOSIS — I4891 Unspecified atrial fibrillation: Secondary | ICD-10-CM | POA: Diagnosis not present

## 2021-03-30 DIAGNOSIS — Z5181 Encounter for therapeutic drug level monitoring: Secondary | ICD-10-CM | POA: Diagnosis not present

## 2021-03-30 LAB — POCT INR: INR: 2.9 (ref 2.0–3.0)

## 2021-03-30 NOTE — Patient Instructions (Signed)
Continue warfarin 1 tablet daily  Recheck in 6 weeks.  

## 2021-04-15 ENCOUNTER — Ambulatory Visit (INDEPENDENT_AMBULATORY_CARE_PROVIDER_SITE_OTHER): Payer: Medicare Other

## 2021-04-15 DIAGNOSIS — I495 Sick sinus syndrome: Secondary | ICD-10-CM

## 2021-04-15 LAB — CUP PACEART REMOTE DEVICE CHECK
Battery Remaining Longevity: 103 mo
Battery Voltage: 2.99 V
Brady Statistic AP VP Percent: 0.06 %
Brady Statistic AP VS Percent: 99.61 %
Brady Statistic AS VP Percent: 0 %
Brady Statistic AS VS Percent: 0.33 %
Brady Statistic RA Percent Paced: 99.68 %
Brady Statistic RV Percent Paced: 0.06 %
Date Time Interrogation Session: 20220824014131
Implantable Lead Implant Date: 20080902
Implantable Lead Implant Date: 20080902
Implantable Lead Location: 753859
Implantable Lead Location: 753860
Implantable Lead Model: 5076
Implantable Lead Model: 5076
Implantable Pulse Generator Implant Date: 20180509
Lead Channel Impedance Value: 361 Ohm
Lead Channel Impedance Value: 361 Ohm
Lead Channel Impedance Value: 456 Ohm
Lead Channel Impedance Value: 494 Ohm
Lead Channel Pacing Threshold Amplitude: 0.625 V
Lead Channel Pacing Threshold Amplitude: 1 V
Lead Channel Pacing Threshold Pulse Width: 0.4 ms
Lead Channel Pacing Threshold Pulse Width: 0.4 ms
Lead Channel Sensing Intrinsic Amplitude: 1 mV
Lead Channel Sensing Intrinsic Amplitude: 1 mV
Lead Channel Sensing Intrinsic Amplitude: 21 mV
Lead Channel Sensing Intrinsic Amplitude: 21 mV
Lead Channel Setting Pacing Amplitude: 2 V
Lead Channel Setting Pacing Amplitude: 2.5 V
Lead Channel Setting Pacing Pulse Width: 0.4 ms
Lead Channel Setting Sensing Sensitivity: 0.9 mV

## 2021-04-21 ENCOUNTER — Other Ambulatory Visit: Payer: Self-pay | Admitting: Cardiology

## 2021-04-30 NOTE — Progress Notes (Signed)
Remote pacemaker transmission.   

## 2021-05-11 ENCOUNTER — Ambulatory Visit (INDEPENDENT_AMBULATORY_CARE_PROVIDER_SITE_OTHER): Payer: Medicare Other | Admitting: *Deleted

## 2021-05-11 ENCOUNTER — Other Ambulatory Visit: Payer: Self-pay

## 2021-05-11 DIAGNOSIS — I4891 Unspecified atrial fibrillation: Secondary | ICD-10-CM | POA: Diagnosis not present

## 2021-05-11 DIAGNOSIS — Z5181 Encounter for therapeutic drug level monitoring: Secondary | ICD-10-CM

## 2021-05-11 LAB — POCT INR: INR: 2.1 (ref 2.0–3.0)

## 2021-05-11 NOTE — Patient Instructions (Signed)
Continue warfarin 1 tablet daily  Recheck in 6 weeks.  

## 2021-05-19 ENCOUNTER — Other Ambulatory Visit: Payer: Self-pay | Admitting: Cardiology

## 2021-06-10 ENCOUNTER — Encounter: Payer: Self-pay | Admitting: Adult Health

## 2021-06-10 ENCOUNTER — Ambulatory Visit: Payer: Medicare Other | Admitting: Adult Health

## 2021-06-10 VITALS — BP 161/75 | HR 80 | Ht 65.0 in | Wt 157.2 lb

## 2021-06-10 DIAGNOSIS — G4733 Obstructive sleep apnea (adult) (pediatric): Secondary | ICD-10-CM | POA: Diagnosis not present

## 2021-06-10 DIAGNOSIS — Z9989 Dependence on other enabling machines and devices: Secondary | ICD-10-CM

## 2021-06-10 NOTE — Progress Notes (Signed)
PATIENT: Johnny Reynolds DOB: 1941-03-19  REASON FOR VISIT: follow up HISTORY FROM: patient   HISTORY OF PRESENT ILLNESS: Today 06/10/21:  Johnny Reynolds is an 80 year old male with a history of obstructive sleep apnea on CPAP.  He returns today for follow-up.  His download indicates that he uses machine nightly and greater than 4 hours each night.  On average he uses 7 hours and 25 minutes.  His residual AHI is 1.4 on 5 to 16 cm water with EPR 3.  Leak in the 95th percentile is 24.2 L/min.  He reports that the CPAP is working well.  Denies any new issues  HISTORY 06/06/19:   Johnny Reynolds is a 80 year old male with a history of obstructive sleep apnea on CPAP.  His download indicates that he uses machine nightly for compliance of 100%.  He uses machine greater than 4 hours each night.  On average he uses his machine 7 hours and 19 minutes.  His residual AHI is 3.2 on 5 to 16 cm of water with EPR of 3.  His leak in the 95th percentile is 43.1 L/min.  REVIEW OF SYSTEMS: Out of a complete 14 system review of symptoms, the patient complains only of the following symptoms, and all other reviewed systems are negative.  ESS 0  ALLERGIES: Allergies  Allergen Reactions   Fexofenadine Hcl Palpitations    Allegra    HOME MEDICATIONS: Outpatient Medications Prior to Visit  Medication Sig Dispense Refill   Ascorbic Acid (VITAMIN C) 1000 MG tablet 500 mg.      atorvastatin (LIPITOR) 80 MG tablet TAKE ONE TABLET BY MOUTH DAILY. 30 tablet 0   Cholecalciferol (VITAMIN D3) 50 MCG (2000 UT) CAPS Take 2,000 Units by mouth daily.     diltiazem (CARDIZEM CD) 240 MG 24 hr capsule Take 240 mg by mouth at bedtime.      EASYMAX TEST test strip USE TO TEST BLOOD SUGARATWICE DAILY.     Lancets (STERILANCE TL) MISC USE TO TEST BLOOG SUGARWTWICE DAILY.     levothyroxine (SYNTHROID) 88 MCG tablet Take 88 mcg by mouth daily before breakfast.      losartan (COZAAR) 50 MG tablet TAKE 1 TABLET BY MOUTH ONCE DAILY. 30  tablet 0   saw palmetto 500 MG capsule Take 500 mg by mouth 2 (two) times daily.      sotalol (BETAPACE) 160 MG tablet TAKE (1) TABLET BY MOUTH TWICE DAILY. 180 tablet 3   warfarin (COUMADIN) 5 MG tablet TAKE (1) TABLET BY MOUTH DAILY AS DIRECTED. 100 tablet 4   zinc gluconate 50 MG tablet 25 mg daily.      Polyethyl Glycol-Propyl Glycol (SYSTANE ULTRA) 0.4-0.3 % SOLN Apply 1-2 drops to eye daily as needed (FOR DRY EYE RELIEF).     No facility-administered medications prior to visit.    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 2008   USES C-PAP    PAST SURGICAL HISTORY: Past Surgical History:  Procedure Laterality Date   [place]  06/18/1996   mid-circ tandem  overlying stents with 20%,70%mid-LAD naroowin stent site in circ with 70% stenosis in the mid to distal marginal branch,tandem 95% and 80% stenosis in the proximal portion of the posterior descending branch of RCA    CATARACT EXTRACTION  1983   COLONOSCOPY N/A 09/04/2014   Procedure: COLONOSCOPY;  Surgeon: [name], MD;  [facility];  Service: Endoscopy;  Laterality: N/A;  1030   CORONARY ANGIOPLASTY WITH STENT PLACEMENT  08/18/1995   PTA/STENT CIRC   CORONARY ANGIOPLASTY WITH STENT PLACEMENT  06/18/1996   PTCA  posterior descending  branch of the RCA 95 AND 80% TO LESSTHAN 20%   CORONARY STENT PLACEMENT  1996   DOPPLER ECHOCARDIOGRAPHY  04/17/2010   EF =50-55%; mild concentric LVH,LAE, AORTIC SCLEROSIS.TRACE TO MILD CENTRAL AI, TRACE MITRAL REGURGITATION, CANNOT ASSESS DIASTOLIC FUNCTIONDUE TO UNDERLYING RHYTHM,PACERMAKER WIRE IN THE  RA/RV   DOPPLER ECHOCARDIOGRAPHY  04/21/2007   DONE IN [place]-- MILD MITRAL REGURG., MILD TRICUSPID [place]. WITH MILD PULM. HTN, MILD CONCENTRIC LEFT VENTRICULAR HYPERTROPHY,MILD LEFT ATRIAL ENLARGEMENT.   EYE SURGERY  1983   Cataract   HERNIA REPAIR Bilateral 2000   INGUINAL HERNIA REPAIR Right 12/13/2018   Procedure: HERNIA REPAIR INGUINAL ADULT WITH MESH;  Surgeon: Aviva Signs, MD;  Location: [facility];  Service: General;  Laterality: Right;   INSERT / REPLACE / REMOVE PACEMAKER  SEPT 2008   INSERT DUAL -CHAMBER-medtronic adapta ADDR01 serial   [id number]   [place] W/SPECT  04/17/2010   EF 50%; LOW NORMAL LV FUNCTION,   PACEMAKER INSERTION  2008   DUAL-CHAMBER   PPM GENERATOR CHANGEOUT N/A 12/29/2016   Procedure: PPM Generator Changeout;  Surgeon: Evans Lance, MD;  Location: [facility];  Service: Cardiovascular;  Laterality: N/A;    FAMILY HISTORY: Family History  Problem Relation Age of Onset   Peripheral vascular disease Mother    Hypertension Mother    Diabetes Mother    Heart disease Mother    Hyperlipidemia Mother    Stroke Father 41       TIA   Peripheral vascular disease Sister    Cancer Sister    Diabetes Sister    Heart disease Sister        Stent- Heart Disease before age 94   Hyperlipidemia Sister    Hypertension Sister    Peripheral vascular disease Brother    Diabetes Brother    Heart disease Brother        CHF and Heart Disease before age 4   Hyperlipidemia Brother    Hypertension Brother    Sleep apnea Neg Hx     SOCIAL HISTORY: Social History   Socioeconomic History   Marital status: Married    Spouse name: Not on file   Number of children: Not on file   Years of education: Not on file   Highest education level: Not on file  Occupational History   Not on file  Tobacco Use   Smoking status: Never   Smokeless tobacco: Never  Vaping Use   Vaping Use: Never used  Substance and Sexual Activity   Alcohol use: No     Alcohol/week: 0.0 standard drinks   Drug use: No   Sexual activity: Not on file  Other Topics Concern   Not on file  Social History Narrative   Controls his diabetes through diet and exercise.   Social Determinants of Health   Financial Resource Strain: Not on file  Food Insecurity: Not on file  Transportation Needs: Not on file  Physical Activity: Not on file  Stress: Not on file  Social Connections: Not on file  Intimate Partner Violence: Not on file      PHYSICAL EXAM  Vitals:   06/10/21 0931  BP: (!) 161/75  Pulse: 80  Weight: 157 lb 3.2 oz (71.3 kg)  Height: 5\' 5"  (1.651 m)   Body mass index is 26.16 kg/m.  Generalized: Well developed, in no acute distress  Chest: Lungs clear to auscultation bilaterally  Neurological examination  Mentation: Alert oriented to time, place, history taking. Follows all commands speech and language fluent Cranial nerve II-XII: Extraocular movements were full, visual field were full on confrontational test Head turning and shoulder shrug  were normal and symmetric. Motor: The motor testing reveals 5 over 5 strength of all 4 extremities. Good symmetric motor tone is noted throughout.  Sensory: Sensory testing is intact to soft touch on all 4 extremities. No evidence of extinction is noted.  Gait and station: Gait is normal.    DIAGNOSTIC DATA (LABS, IMAGING, TESTING) - I reviewed patient records, labs, notes, testing and imaging myself where available.  Lab Results  Component Value Date   WBC 5.6 12/12/2018   HGB 13.5 12/12/2018   HCT 41.4 12/12/2018   MCV 89.8 12/12/2018   PLT 164 12/12/2018      Component Value Date/Time   NA 136 12/12/2018 1404   K 4.2 12/12/2018 1404   CL 101 12/12/2018 1404   CO2 27 12/12/2018 1404   GLUCOSE 100 (H) 12/12/2018 1404   BUN 14 12/12/2018 1404   CREATININE 0.81 12/12/2018 1404   CREATININE 0.86 12/06/2017 1429   CALCIUM 9.1 12/12/2018 1404   PROT 5.9 (L) 12/06/2017 1429   ALBUMIN 4.4  07/03/2013 0914   AST 20 12/06/2017 1429   ALT 16 12/06/2017 1429   ALKPHOS 73 07/03/2013 0914   BILITOT 0.7 12/06/2017 1429   GFRNONAA >60 12/12/2018 1404   GFRAA >60 12/12/2018 1404   Lab Results  Component Value Date   CHOL 141 07/03/2013   HDL 45 07/03/2013   LDLCALC 81 07/03/2013   TRIG 77 07/03/2013   CHOLHDL 3.1 07/03/2013     ASSESSMENT AND PLAN 80 y.o. year old male  has a past medical history of AAA (abdominal aortic aneurysm), Arrhythmia, Atrial fibrillation ([place]), Back pain, chronic, Coronary artery disease (1996 and 1997), Diabetes mellitus without complication ([place]), High cholesterol, Hypertension, Left shoulder pain (2014), and Sleep apnea (Sept. 2008). here with:  OSA on CPAP  - CPAP compliance excellent - Good treatment of AHI  - Encourage patient to use CPAP nightly and > 4 hours each night - F/U in 1 year or sooner if needed   Ward Givens, MSN, NP-C 06/10/2021, 9:49 AM [facility] [address] [place], [place] [address] [phone]

## 2021-06-24 ENCOUNTER — Ambulatory Visit (INDEPENDENT_AMBULATORY_CARE_PROVIDER_SITE_OTHER): Payer: Medicare Other | Admitting: *Deleted

## 2021-06-24 DIAGNOSIS — Z5181 Encounter for therapeutic drug level monitoring: Secondary | ICD-10-CM | POA: Diagnosis not present

## 2021-06-24 DIAGNOSIS — I4891 Unspecified atrial fibrillation: Secondary | ICD-10-CM

## 2021-06-24 LAB — POCT INR: INR: 2.9 (ref 2.0–3.0)

## 2021-06-24 NOTE — Patient Instructions (Signed)
Continue warfarin 1 tablet daily  Recheck in 6 weeks.  

## 2021-07-15 ENCOUNTER — Ambulatory Visit (INDEPENDENT_AMBULATORY_CARE_PROVIDER_SITE_OTHER): Payer: Medicare Other

## 2021-07-15 DIAGNOSIS — I495 Sick sinus syndrome: Secondary | ICD-10-CM

## 2021-07-15 LAB — CUP PACEART REMOTE DEVICE CHECK
Battery Remaining Longevity: 101 mo
Battery Voltage: 2.99 V
Brady Statistic AP VP Percent: 0.09 %
Brady Statistic AP VS Percent: 99.73 %
Brady Statistic AS VP Percent: 0 %
Brady Statistic AS VS Percent: 0.17 %
Brady Statistic RA Percent Paced: 99.84 %
Brady Statistic RV Percent Paced: 0.09 %
Date Time Interrogation Session: 20221123003834
Implantable Lead Implant Date: 20080902
Implantable Lead Implant Date: 20080902
Implantable Lead Location: 753859
Implantable Lead Location: 753860
Implantable Lead Model: 5076
Implantable Lead Model: 5076
Implantable Pulse Generator Implant Date: 20180509
Lead Channel Impedance Value: 380 Ohm
Lead Channel Impedance Value: 380 Ohm
Lead Channel Impedance Value: 475 Ohm
Lead Channel Impedance Value: 513 Ohm
Lead Channel Pacing Threshold Amplitude: 0.625 V
Lead Channel Pacing Threshold Amplitude: 1 V
Lead Channel Pacing Threshold Pulse Width: 0.4 ms
Lead Channel Pacing Threshold Pulse Width: 0.4 ms
Lead Channel Sensing Intrinsic Amplitude: 0.875 mV
Lead Channel Sensing Intrinsic Amplitude: 0.875 mV
Lead Channel Sensing Intrinsic Amplitude: 22.625 mV
Lead Channel Sensing Intrinsic Amplitude: 22.625 mV
Lead Channel Setting Pacing Amplitude: 2 V
Lead Channel Setting Pacing Amplitude: 2.5 V
Lead Channel Setting Pacing Pulse Width: 0.4 ms
Lead Channel Setting Sensing Sensitivity: 0.9 mV

## 2021-07-27 NOTE — Progress Notes (Signed)
Remote pacemaker transmission.   

## 2021-08-04 ENCOUNTER — Ambulatory Visit (INDEPENDENT_AMBULATORY_CARE_PROVIDER_SITE_OTHER): Payer: Medicare Other | Admitting: *Deleted

## 2021-08-04 DIAGNOSIS — Z5181 Encounter for therapeutic drug level monitoring: Secondary | ICD-10-CM | POA: Diagnosis not present

## 2021-08-04 DIAGNOSIS — I4891 Unspecified atrial fibrillation: Secondary | ICD-10-CM | POA: Diagnosis not present

## 2021-08-04 LAB — POCT INR: INR: 3.2 — AB (ref 2.0–3.0)

## 2021-08-04 NOTE — Patient Instructions (Signed)
Hold warfarin tonight then resume 1 tablet daily  Recheck in 6 weeks.

## 2021-08-24 ENCOUNTER — Other Ambulatory Visit: Payer: Self-pay | Admitting: Cardiology

## 2021-08-25 ENCOUNTER — Other Ambulatory Visit: Payer: Self-pay | Admitting: Cardiology

## 2021-08-26 ENCOUNTER — Other Ambulatory Visit: Payer: Self-pay | Admitting: *Deleted

## 2021-08-26 MED ORDER — LOSARTAN POTASSIUM 50 MG PO TABS: 50.0000 mg | ORAL_TABLET | Freq: Every day | ORAL | 0 refills | Status: DC

## 2021-08-26 NOTE — Telephone Encounter (Signed)
Pt has upcoming with Ermalinda Barrios, PA-C on 10/05/21.

## 2021-09-15 ENCOUNTER — Other Ambulatory Visit: Payer: Self-pay

## 2021-09-15 ENCOUNTER — Ambulatory Visit (INDEPENDENT_AMBULATORY_CARE_PROVIDER_SITE_OTHER): Payer: Medicare Other | Admitting: *Deleted

## 2021-09-15 DIAGNOSIS — Z5181 Encounter for therapeutic drug level monitoring: Secondary | ICD-10-CM

## 2021-09-15 DIAGNOSIS — I4891 Unspecified atrial fibrillation: Secondary | ICD-10-CM | POA: Diagnosis not present

## 2021-09-15 LAB — POCT INR: INR: 2.6 (ref 2.0–3.0)

## 2021-09-15 NOTE — Patient Instructions (Signed)
Continue warfarin 1 tablet daily  Recheck in 6 weeks.  

## 2021-09-18 ENCOUNTER — Other Ambulatory Visit: Payer: Self-pay | Admitting: Cardiology

## 2021-09-22 ENCOUNTER — Ambulatory Visit (INDEPENDENT_AMBULATORY_CARE_PROVIDER_SITE_OTHER): Payer: Medicare Other | Admitting: Internal Medicine

## 2021-09-22 ENCOUNTER — Other Ambulatory Visit: Payer: Self-pay

## 2021-09-22 ENCOUNTER — Encounter: Payer: Self-pay | Admitting: Internal Medicine

## 2021-09-22 VITALS — BP 184/82 | HR 77 | Wt 161.0 lb

## 2021-09-22 DIAGNOSIS — I495 Sick sinus syndrome: Secondary | ICD-10-CM | POA: Diagnosis not present

## 2021-09-22 LAB — CUP PACEART INCLINIC DEVICE CHECK
Battery Remaining Longevity: 100 mo
Battery Voltage: 2.99 V
Brady Statistic AP VP Percent: 0.07 %
Brady Statistic AP VS Percent: 99.6 %
Brady Statistic AS VP Percent: 0 %
Brady Statistic AS VS Percent: 0.33 %
Brady Statistic RA Percent Paced: 99.68 %
Brady Statistic RV Percent Paced: 0.07 %
Date Time Interrogation Session: 20230131164258
Implantable Lead Implant Date: 20080902
Implantable Lead Implant Date: 20080902
Implantable Lead Location: 753859
Implantable Lead Location: 753860
Implantable Lead Model: 5076
Implantable Lead Model: 5076
Implantable Pulse Generator Implant Date: 20180509
Lead Channel Impedance Value: 380 Ohm
Lead Channel Impedance Value: 380 Ohm
Lead Channel Impedance Value: 456 Ohm
Lead Channel Impedance Value: 513 Ohm
Lead Channel Pacing Threshold Amplitude: 0.75 V
Lead Channel Pacing Threshold Amplitude: 1 V
Lead Channel Pacing Threshold Pulse Width: 0.4 ms
Lead Channel Pacing Threshold Pulse Width: 0.4 ms
Lead Channel Sensing Intrinsic Amplitude: 1 mV
Lead Channel Sensing Intrinsic Amplitude: 1 mV
Lead Channel Sensing Intrinsic Amplitude: 22.125 mV
Lead Channel Sensing Intrinsic Amplitude: 24.25 mV
Lead Channel Setting Pacing Amplitude: 2 V
Lead Channel Setting Pacing Amplitude: 2.5 V
Lead Channel Setting Pacing Pulse Width: 0.4 ms
Lead Channel Setting Sensing Sensitivity: 0.9 mV

## 2021-09-22 NOTE — Progress Notes (Signed)
HPI Mr. Woessner returns today for followup. He is a pleasant 81 yo man with a h/o sinus node dysfunction, PAF, s/p PPM insertion. He has not had any additional bleeding. He feels well although he admits to being not as active. 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 Since his last visit, he has been treated for Covid 19. He denies chest pain or sob or edema. He remains active exercising daily. Allergies  Allergen Reactions   Fexofenadine Hcl Palpitations    Allegra     Current Outpatient Medications  Medication Sig Dispense Refill   Ascorbic Acid (VITAMIN C) 1000 MG tablet 500 mg.      atorvastatin (LIPITOR) 80 MG tablet TAKE ONE TABLET BY MOUTH DAILY. 30 tablet 0   Cholecalciferol (VITAMIN D3) 50 MCG (2000 UT) CAPS Take 2,000 Units by mouth daily.     diltiazem (CARDIZEM CD) 240 MG 24 hr capsule Take 240 mg by mouth at bedtime.      EASYMAX TEST test strip USE TO TEST BLOOD SUGARATWICE DAILY.     Lancets (STERILANCE TL) MISC USE TO TEST BLOOG SUGARWTWICE DAILY.     levothyroxine (SYNTHROID) 88 MCG tablet Take 88 mcg by mouth daily before breakfast.      losartan (COZAAR) 50 MG tablet Take 1 tablet (50 mg total) by mouth daily. 60 tablet 0   saw palmetto 500 MG capsule Take 500 mg by mouth 2 (two) times daily.      sotalol (BETAPACE) 160 MG tablet TAKE (1) TABLET BY MOUTH TWICE DAILY. 180 tablet 3   warfarin (COUMADIN) 5 MG tablet TAKE (1) TABLET BY MOUTH DAILY AS DIRECTED. 100 tablet 4   zinc gluconate 50 MG tablet 50 mg daily.     Polyethyl Glycol-Propyl Glycol (SYSTANE ULTRA) 0.4-0.3 % SOLN Apply 1-2 drops to eye daily as needed (FOR DRY EYE RELIEF).     No current facility-administered medications for this visit.     Past Medical History:  Diagnosis Date   AAA (abdominal aortic aneurysm)    Arrhythmia    Atrial fibrillation (HCC)    Back pain, chronic    Coronary artery disease 1996 and 1997   angioplasty of LAD   Diabetes mellitus without  complication ([place])    MILD TYPE 2   High cholesterol    Hypertension    Left shoulder pain 2014   Sleep apnea Sept. 2008   USES C-PAP    ROS:   All systems reviewed and negative except as noted in the HPI.   Past Surgical History:  Procedure Laterality Date   [place]  06/18/1996   mid-circ tandem overlying stents with 20%,70%mid-LAD naroowin stent site in circ with 70% stenosis in the mid to distal marginal branch,tandem 95% and 80% stenosis in the proximal portion of the posterior descending branch of RCA    CATARACT EXTRACTION  1983   COLONOSCOPY N/A 09/04/2014   Procedure: COLONOSCOPY;  Surgeon: [name], MD;  [facility];  Service: Endoscopy;  Laterality: N/A;  1030   CORONARY ANGIOPLASTY WITH STENT PLACEMENT  08/18/1995   PTA/STENT CIRC   CORONARY ANGIOPLASTY WITH STENT PLACEMENT  06/18/1996   PTCA  posterior descending  branch of the RCA 95 AND 80% TO LESSTHAN 20%   CORONARY STENT PLACEMENT  1996   DOPPLER ECHOCARDIOGRAPHY  04/17/2010   EF =50-55%; mild concentric LVH,LAE, AORTIC SCLEROSIS.TRACE TO MILD CENTRAL AI, TRACE MITRAL  REGURGITATION, CANNOT ASSESS DIASTOLIC FUNCTIONDUE TO UNDERLYING RHYTHM,PACERMAKER WIRE IN THE RA/RV   DOPPLER ECHOCARDIOGRAPHY  04/21/2007   DONE IN [place]-- MILD MITRAL REGURG., MILD TRICUSPID [place]. WITH MILD PULM. HTN, MILD CONCENTRIC LEFT VENTRICULAR HYPERTROPHY,MILD LEFT ATRIAL ENLARGEMENT.   EYE SURGERY  1983   Cataract   HERNIA REPAIR Bilateral 2000   INGUINAL HERNIA REPAIR Right 12/13/2018   Procedure: HERNIA REPAIR INGUINAL ADULT WITH MESH;  Surgeon: Aviva Signs, MD;  Location: [facility];  Service: General;  Laterality: Right;   INSERT / REPLACE / REMOVE PACEMAKER  SEPT 2008   INSERT DUAL -CHAMBER-medtronic adapta ADDR01 serial   [id number]   [place] W/SPECT  04/17/2010   EF 50%; LOW NORMAL LV FUNCTION,   PACEMAKER INSERTION  2008   DUAL-CHAMBER   PPM GENERATOR  CHANGEOUT N/A 12/29/2016   Procedure: PPM Generator Changeout;  Surgeon: Evans Lance, MD;  Location: [facility];  Service: Cardiovascular;  Laterality: N/A;     Family History  Problem Relation Age of Onset   Peripheral vascular disease Mother    Hypertension Mother    Diabetes Mother    Heart disease Mother    Hyperlipidemia Mother    Stroke Father 43       TIA   Peripheral vascular disease Sister    Cancer Sister    Diabetes Sister    Heart disease Sister        Stent- Heart Disease before age 107   Hyperlipidemia Sister    Hypertension Sister    Peripheral vascular disease Brother    Diabetes Brother    Heart disease Brother        CHF and Heart Disease before age 87   Hyperlipidemia Brother    Hypertension Brother    Sleep apnea Neg Hx      Social History   Socioeconomic History   Marital status: Married    Spouse name: Not on file   Number of children: Not on file   Years of education: Not on file   Highest education level: Not on file  Occupational History   Not on file  Tobacco Use   Smoking status: Never   Smokeless tobacco: Never  Vaping Use   Vaping Use: Never used  Substance and Sexual Activity   Alcohol use: No    Alcohol/week: 0.0 standard drinks   Drug use: No   Sexual activity: Not on file  Other Topics Concern   Not on file  Social History Narrative   Controls his diabetes through diet and exercise.   Social Determinants of Health   Financial Resource Strain: Not on file  Food Insecurity: Not on file  Transportation Needs: Not on file  Physical Activity: Not on file  Stress: Not on file  Social Connections: Not on file  Intimate Partner Violence: Not on file     BP (!) 184/82    Pulse 77    Wt 161 lb (73 kg)    SpO2 98%    BMI 26.79 kg/m  BP 140/82 by me Physical Exam:  Well appearing NAD HEENT: Unremarkable Neck:  No JVD, no thyromegally Lymphatics:  No adenopathy Back:  No CVA tenderness Lungs:  Clear HEART:   Regular rate rhythm, no murmurs, no rubs, no clicks Abd:  soft, positive bowel sounds, no organomegally, no rebound, no guarding Ext:  2 plus pulses, no edema, no cyanosis, no clubbing Skin:  No rashes no nodules Neuro:  CN II through XII intact,  motor grossly intact  EKG  DEVICE  Normal device function.  See PaceArt for details.   Assess/Plan:  1. PAF - he is maintaining NSR over 99.9% of the time. 2. HTN - his bp is well controlled. 3. Sinus node dysfunction - he is atrial pacing over 99.5% of the time and is asymptomatic.  4. 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   Carleene Overlie Ronald Londo,MD

## 2021-09-22 NOTE — Patient Instructions (Signed)
Medication Instructions:  Your physician recommends that you continue on your current medications as directed. Please refer to the Current Medication list given to you today.  *If you need a refill on your cardiac medications before your next appointment, please call your pharmacy*   Lab Work: NONE   If you have labs (blood work) drawn today and your tests are completely normal, you will receive your results only by: . MyChart Message (if you have MyChart) OR . A paper copy in the mail If you have any lab test that is abnormal or we need to change your treatment, we will call you to review the results.   Testing/Procedures: NONE    Follow-Up: At [facility], you and your health needs are our priority.  As part of our continuing mission to provide you with exceptional heart care, we have created designated Provider Care Teams.  These Care Teams include your primary Cardiologist (physician) and Advanced Practice Providers (APPs -  Physician Assistants and Nurse Practitioners) who all work together to provide you with the care you need, when you need it.  We recommend signing up for the patient portal called "MyChart".  Sign up information is provided on this After Visit Summary.  MyChart is used to connect with patients for Virtual Visits (Telemedicine).  Patients are able to view lab/test results, encounter notes, upcoming appointments, etc.  Non-urgent messages can be sent to your provider as well.   To learn more about what you can do with MyChart, go to [email or web address].    Your next appointment:   1 year(s)  The format for your next appointment:   In Person  Provider:   Gregg Taylor, MD   Other Instructions Thank you for choosing [facility]!    

## 2021-09-23 NOTE — Progress Notes (Signed)
Cardiology Office Note    Date:  10/05/2021   ID:  Johnny Reynolds, DOB 1941/04/03, MRN [id number]   PCP:  Sharilyn Sites, [place]  Cardiologist:  None   Advanced Practice Provider:  No care team member to display Electrophysiologist:  None   [id number]   Chief Complaint  Patient presents with   Follow-up    History of Present Illness:  Johnny Reynolds is a 81 y.o. male with history of hypertension, PAF on Coumadin, tachybradycardia syndrome status post dual-chamber pacemaker with generator change followed by Dr. Lovena Le, CAD remote angioplasty 1996 in 1997, HLD, OSA, AAA followed by vascular.     Patient saw Dr. Lovena Le 09/22/2021 and was maintaining normal sinus rhythm over 99.9% of the time atrial pacing 99% of the time asymptomatic.  Patient saw Dr. Harl Bowie 01/2017 and was doing well.  Patient comes in for f/u. BP running high. 190/95 last night-says he was cold. This am 127/75. 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 Goes to the gym 3 days/week. Does treadmill 30 min, and weights. No chest pain, palpitations, dyspnea, edema. Had blood work by PCP 06/2021 by Dr. Hilma Favors.    Past Medical History:  Diagnosis Date   AAA (abdominal aortic aneurysm)    Arrhythmia    Atrial fibrillation (HCC)    Back pain, chronic    Coronary artery disease 1996 and 1997   angioplasty of LAD   Diabetes mellitus without complication ([place])    MILD TYPE 2   High cholesterol    Hypertension    Left shoulder pain 2014   Sleep apnea Sept. 2008   USES C-PAP    Past Surgical History:  Procedure Laterality Date   [place]  06/18/1996   mid-circ tandem overlying stents with 20%,70%mid-LAD naroowin stent site in circ with 70% stenosis in the mid to distal marginal branch,tandem 95% and 80% stenosis in the proximal portion of the posterior descending branch of RCA    CATARACT EXTRACTION   1983   COLONOSCOPY N/A 09/04/2014   Procedure: COLONOSCOPY;  Surgeon: [name], MD;  Location: [facility];  Service: Endoscopy;  Laterality: N/A;  1030   CORONARY ANGIOPLASTY WITH STENT PLACEMENT  08/18/1995   PTA/STENT CIRC   CORONARY ANGIOPLASTY WITH STENT PLACEMENT  06/18/1996   PTCA  posterior descending  branch of the RCA 95 AND 80% TO LESSTHAN 20%   CORONARY STENT PLACEMENT  1996   DOPPLER ECHOCARDIOGRAPHY  04/17/2010   EF =50-55%; mild concentric LVH,LAE, AORTIC SCLEROSIS.TRACE TO MILD CENTRAL AI, TRACE MITRAL REGURGITATION, CANNOT ASSESS DIASTOLIC FUNCTIONDUE TO UNDERLYING RHYTHM,PACERMAKER WIRE IN THE RA/RV   DOPPLER ECHOCARDIOGRAPHY  04/21/2007   DONE IN [place]-- MILD MITRAL REGURG., MILD TRICUSPID [place]. WITH MILD PULM. HTN, MILD CONCENTRIC LEFT VENTRICULAR HYPERTROPHY,MILD LEFT ATRIAL ENLARGEMENT.   EYE SURGERY  1983   Cataract   HERNIA REPAIR Bilateral 2000   INGUINAL HERNIA REPAIR Right 12/13/2018   Procedure: HERNIA REPAIR INGUINAL ADULT WITH MESH;  Surgeon: Aviva Signs, MD;  Location: [facility];  Service: General;  Laterality: Right;   INSERT / REPLACE / REMOVE PACEMAKER  SEPT 2008   INSERT DUAL -CHAMBER-medtronic adapta ADDR01 serial   [id number]   [place] W/SPECT  04/17/2010   EF 50%; LOW NORMAL LV FUNCTION,   PACEMAKER INSERTION  2008   DUAL-CHAMBER   PPM GENERATOR CHANGEOUT N/A 12/29/2016  Procedure: PPM Generator Changeout;  Surgeon: Evans Lance, MD;  Location: [facility];  Service: Cardiovascular;  Laterality: N/A;    Current Medications: Current Meds  Medication Sig   Ascorbic Acid (VITAMIN C) 1000 MG tablet 500 mg.    atorvastatin (LIPITOR) 80 MG tablet TAKE ONE TABLET BY MOUTH DAILY.   Cholecalciferol (VITAMIN D3) 50 MCG (2000 UT) CAPS Take 2,000 Units by mouth daily.   diltiazem (CARDIZEM CD) 240 MG 24 hr capsule Take 240 mg by mouth at bedtime.    EASYMAX TEST test strip USE TO TEST BLOOD SUGARATWICE DAILY.   Lancets  (STERILANCE TL) MISC USE TO TEST BLOOG SUGARWTWICE DAILY.   levothyroxine (SYNTHROID) 88 MCG tablet Take 88 mcg by mouth daily before breakfast.    saw palmetto 500 MG capsule Take 500 mg by mouth 2 (two) times daily.    sotalol (BETAPACE) 160 MG tablet TAKE (1) TABLET BY MOUTH TWICE DAILY.   warfarin (COUMADIN) 5 MG tablet TAKE (1) TABLET BY MOUTH DAILY AS DIRECTED.   zinc gluconate 50 MG tablet 50 mg daily.   [DISCONTINUED] losartan (COZAAR) 50 MG tablet Take 1 tablet (50 mg total) by mouth daily.     Allergies:   Fexofenadine hcl   Social History   Socioeconomic History   Marital status: Married    Spouse name: Not on file   Number of children: Not on file   Years of education: Not on file   Highest education level: Not on file  Occupational History   Not on file  Tobacco Use   Smoking status: Never   Smokeless tobacco: Never  Vaping Use   Vaping Use: Never used  Substance and Sexual Activity   Alcohol use: No    Alcohol/week: 0.0 standard drinks   Drug use: No   Sexual activity: Not on file  Other Topics Concern   Not on file  Social History Narrative   Controls his diabetes through diet and exercise.   Social Determinants of Health   Financial Resource Strain: Not on file  Food Insecurity: Not on file  Transportation Needs: Not on file  Physical Activity: Not on file  Stress: Not on file  Social Connections: Not on file     Family History:  The patient's  family history includes Cancer in his sister; Diabetes in his brother, mother, and sister; Heart disease in his brother, mother, and sister; Hyperlipidemia in his brother, mother, and sister; Hypertension in his brother, mother, and sister; Peripheral vascular disease in his brother, mother, and sister; Stroke (age of onset: 44) in his father.   ROS:   Please see the history of present illness.    ROS All other systems reviewed and are negative.   PHYSICAL EXAM:   VS:  BP (!) 160/90    Pulse 80    Ht 5\' 6"   (1.676 m)    Wt 161 lb (73 kg)    SpO2 98%    BMI 25.99 kg/m   Physical Exam  GEN: Well nourished, well developed, in no acute distress  Neck: no JVD, carotid bruits, or masses Cardiac:RRR; no murmurs, rubs, or gallops  Respiratory:  clear to auscultation bilaterally, normal work of breathing GI: soft, nontender, nondistended, + BS Ext: without cyanosis, clubbing, or edema, Good distal pulses bilaterally Neuro:  Alert and Oriented x 3 Psych: euthymic mood, full affect  Wt Readings from Last 3 Encounters:  10/05/21 161 lb (73 kg)  09/22/21 161 lb (73 kg)  06/10/21 157 lb 3.2 oz (71.3 kg)      Studies/Labs Reviewed:   EKG:  EKG is not ordered today.    Recent Labs: No results found for requested labs within last 8760 hours.   Lipid Panel    Component Value Date/Time   CHOL 141 07/03/2013 0914   TRIG 77 07/03/2013 0914   HDL 45 07/03/2013 0914   CHOLHDL 3.1 07/03/2013 0914   VLDL 15 07/03/2013 0914   LDLCALC 81 07/03/2013 0914    Additional studies/ records that were reviewed today include:  AAA duplex 03/06/2020  Summary:  Abdominal Aorta: There is evidence of abnormal dilatation of the distal  Abdominal aorta. Proximal aorta appears ectatic. The largest aortic  diameter remains essentially unchanged compared to prior exam. Previous  diameter measurement was 3.03 x 2.99 cm  obtained on 07/06/18.    *See table(s) above for measurements and observations.    Electronically signed by Ruta Hinds MD on 03/06/2020 at 1:08:32 PM     Risk Assessment/Calculations:         ASSESSMENT:    1. Coronary artery disease involving native coronary artery of native heart without angina pectoris   2. Essential hypertension   3. Paroxysmal atrial fibrillation (HCC)   4. Pacemaker   5. Hyperlipidemia, unspecified hyperlipidemia type   6. OSA on CPAP   7. Abdominal aortic aneurysm (AAA) without rupture, unspecified part      PLAN:  In order of problems listed  above:  CAD status post remote angioplasty 1996 in 1997-no angina  Hypertension BP running high. Will increase losartan 75 mg daily. Has been dizzy on 100 mg in past when he lost weight. Has been getting extra salt in his diet-bacon. Check BP in 2 weeks. 2 gm sodium diet  PAF on Coumadin & Sotalol  Tachybradycardia syndrome status post dual chamber pacemaker with generator change followed by Dr. Lovena Le  HLD on lipitor-labs done by Dr. Nevada Crane  OSA uses CPAP  AAA followed by vascular 3.03 x 2.99 on ultrasound 03/06/2020   Shared Decision Making/Informed Consent        Medication Adjustments/Labs and Tests Ordered: Current medicines are reviewed at length with the patient today.  Concerns regarding medicines are outlined above.  Medication changes, Labs and Tests ordered today are listed in the Patient Instructions below. There are no Patient Instructions on file for this visit.   Signed, Ermalinda Barrios, PA-C  10/05/2021 1:24 PM    [facility] [address], [place], [place]  [address] Phone: [phone]; Fax: [phone]

## 2021-10-05 ENCOUNTER — Encounter: Payer: Self-pay | Admitting: *Deleted

## 2021-10-05 ENCOUNTER — Other Ambulatory Visit: Payer: Self-pay

## 2021-10-05 ENCOUNTER — Encounter: Payer: Self-pay | Admitting: Physician Assistant

## 2021-10-05 ENCOUNTER — Ambulatory Visit (INDEPENDENT_AMBULATORY_CARE_PROVIDER_SITE_OTHER): Payer: Medicare Other | Admitting: Physician Assistant

## 2021-10-05 VITALS — BP 160/90 | HR 80 | Ht 66.0 in | Wt 161.0 lb

## 2021-10-05 DIAGNOSIS — I714 Abdominal aortic aneurysm, without rupture, unspecified: Secondary | ICD-10-CM

## 2021-10-05 DIAGNOSIS — I480 Paroxysmal atrial fibrillation: Secondary | ICD-10-CM | POA: Diagnosis not present

## 2021-10-05 DIAGNOSIS — G4733 Obstructive sleep apnea (adult) (pediatric): Secondary | ICD-10-CM

## 2021-10-05 DIAGNOSIS — E785 Hyperlipidemia, unspecified: Secondary | ICD-10-CM

## 2021-10-05 DIAGNOSIS — Z950 Presence of cardiac pacemaker: Secondary | ICD-10-CM

## 2021-10-05 DIAGNOSIS — I10 Essential (primary) hypertension: Secondary | ICD-10-CM | POA: Diagnosis not present

## 2021-10-05 DIAGNOSIS — I2510 Atherosclerotic heart disease of native coronary artery without angina pectoris: Secondary | ICD-10-CM

## 2021-10-05 DIAGNOSIS — Z9989 Dependence on other enabling machines and devices: Secondary | ICD-10-CM

## 2021-10-05 MED ORDER — LOSARTAN POTASSIUM 50 MG PO TABS: 75.0000 mg | ORAL_TABLET | Freq: Every day | ORAL | 3 refills | Status: DC

## 2021-10-05 NOTE — Patient Instructions (Signed)
Medication Instructions:   Increase Losartan to 75 mg Daily   *If you need a refill on your cardiac medications before your next appointment, please call your pharmacy*   Lab Work: Your physician recommends that you return for lab work in: 2 Weeks ( 10/19/21)  If you have labs (blood work) drawn today and your tests are completely normal, you will receive your results only by: [place] (if you have MyChart) OR A paper copy in the mail If you have any lab test that is abnormal or we need to change your treatment, we will call you to review the results.   Testing/Procedures: NONE    Follow-Up: At [facility], you and your health needs are our priority.  As part of our continuing mission to provide you with exceptional heart care, we have created designated Provider Care Teams.  These Care Teams include your primary Cardiologist (physician) and Advanced Practice Providers (APPs -  Physician Assistants and Nurse Practitioners) who all work together to provide you with the care you need, when you need it.  We recommend signing up for the patient portal called "MyChart".  Sign up information is provided on this After Visit Summary.  MyChart is used to connect with patients for Virtual Visits (Telemedicine).  Patients are able to view lab/test results, encounter notes, upcoming appointments, etc.  Non-urgent messages can be sent to your provider as well.   To learn more about what you can do with MyChart, go to [email or web address].    Your next appointment:   6 month(s)  The format for your next appointment:   In Person  Provider:   Carlyle Dolly, MD    Other Instructions Thank you for choosing [facility]!  Your physician has requested that you regularly monitor and record your blood pressure readings at home. Please use the same machine at the same time of day to check your readings and record them to bring to your follow-up visit.  Two Gram Sodium Diet  2000 mg  What is Sodium? Sodium is a mineral found naturally in many foods. The most significant source of sodium in the diet is table salt, which is about 40% sodium.  Processed, convenience, and preserved foods also contain a large amount of sodium.  The body needs only 500 mg of sodium daily to function,  A normal diet provides more than enough sodium even if you do not use salt.  Why Limit Sodium? A build up of sodium in the body can cause thirst, increased blood pressure, shortness of breath, and water retention.  Decreasing sodium in the diet can reduce edema and risk of heart attack or stroke associated with high blood pressure.  Keep in mind that there are many other factors involved in these health problems.  Heredity, obesity, lack of exercise, cigarette smoking, stress and what you eat all play a role.  General Guidelines: Do not add salt at the table or in cooking.  One teaspoon of salt contains over 2 grams of sodium. Read food labels Avoid processed and convenience foods Ask your dietitian before eating any foods not dicussed in the menu planning guidelines Consult your physician if you wish to use a salt substitute or a sodium containing medication such as antacids.  Limit milk and milk products to 16 oz (2 cups) per day.  Shopping Hints: READ LABELS!! "Dietetic" does not necessarily mean low sodium. Salt and other sodium ingredients are often added to foods during processing.    Menu Planning  Guidelines Food Group Choose More Often Avoid  Beverages (see also the milk group All fruit juices, low-sodium, salt-free vegetables juices, low-sodium carbonated beverages Regular vegetable or tomato juices, commercially softened water used for drinking or cooking  Breads and Cereals Enriched white, wheat, rye and pumpernickel bread, hard rolls and dinner rolls; muffins, cornbread and waffles; most dry cereals, cooked cereal without added salt; unsalted crackers and breadsticks; low sodium  or homemade bread crumbs Bread, rolls and crackers with salted tops; quick breads; instant hot cereals; pancakes; commercial bread stuffing; self-rising flower and biscuit mixes; regular bread crumbs or cracker crumbs  Desserts and Sweets Desserts and sweets mad with mild should be within allowance Instant pudding mixes and cake mixes  Fats Butter or margarine; vegetable oils; unsalted salad dressings, regular salad dressings limited to 1 Tbs; light, sour and heavy cream Regular salad dressings containing bacon fat, bacon bits, and salt pork; snack dips made with instant soup mixes or processed cheese; salted nuts  Fruits Most fresh, frozen and canned fruits Fruits processed with salt or sodium-containing ingredient (some dried fruits are processed with sodium sulfites        Vegetables Fresh, frozen vegetables and low- sodium canned vegetables Regular canned vegetables, sauerkraut, pickled vegetables, and others prepared in brine; frozen vegetables in sauces; vegetables seasoned with ham, bacon or salt pork  Condiments, Sauces, Miscellaneous  Salt substitute with physician's approval; pepper, herbs, spices; vinegar, lemon or lime juice; hot pepper sauce; garlic powder, onion powder, low sodium soy sauce (1 Tbs.); low sodium condiments (ketchup, chili sauce, mustard) in limited amounts (1 tsp.) fresh ground horseradish; unsalted tortilla chips, pretzels, potato chips, popcorn, salsa (1/4 cup) Any seasoning made with salt including garlic salt, celery salt, onion salt, and seasoned salt; sea salt, rock salt, kosher salt; meat tenderizers; monosodium glutamate; mustard, regular soy sauce, barbecue, sauce, chili sauce, teriyaki sauce, steak sauce, Worcestershire sauce, and most flavored vinegars; canned gravy and mixes; regular condiments; salted snack foods, olives, picles, relish, horseradish sauce, catsup   Food preparation: Try these seasonings Meats:    Pork Sage, onion Serve with applesauce   Chicken Poultry seasoning, thyme, parsley Serve with cranberry sauce  Lamb Curry powder, rosemary, garlic, thyme Serve with mint sauce or jelly  Veal Marjoram, basil Serve with current jelly, cranberry sauce  Beef Pepper, bay leaf Serve with dry mustard, unsalted chive butter  Fish [place] leaf, dill Serve with unsalted lemon butter, unsalted parsley butter  Vegetables:    Asparagus Lemon juice   Broccoli Lemon juice   Carrots Mustard dressing parsley, mint, nutmeg, glazed with unsalted butter and sugar   Green beans Marjoram, lemon juice, nutmeg,dill seed   Tomatoes Basil, marjoram, onion   Spice /blend for [employer]" 4 tsp ground thyme 1 tsp ground sage 3 tsp ground rosemary 4 tsp ground marjoram   Test your knowledge A product that says "Salt Free" may still contain sodium. True or False Garlic Powder and Hot Pepper Sauce an be used as alternative seasonings.True or False Processed foods have more sodium than fresh foods.  True or False Canned Vegetables have less sodium than froze True or False   WAYS TO DECREASE YOUR SODIUM INTAKE Avoid the use of added salt in cooking and at the table.  Table salt (and other prepared seasonings which contain salt) is probably one of the greatest sources of sodium in the diet.  Unsalted foods can gain flavor from the sweet, sour, and butter taste sensations of herbs and spices.  Instead of using salt for seasoning, try the following seasonings with the foods listed.  Remember: how you use them to enhance natural food flavors is limited only by your creativity... Allspice-Meat, fish, eggs, fruit, peas, red and yellow vegetables Almond Extract-Fruit baked goods Anise Seed-Sweet breads, fruit, carrots, beets, cottage cheese, cookies (tastes like licorice) Basil-Meat, fish, eggs, vegetables, rice, vegetables salads, soups, sauces Bay Leaf-Meat, fish, stews, poultry Burnet-Salad, vegetables (cucumber-like flavor) Caraway Seed-Bread, cookies, cottage  cheese, meat, vegetables, cheese, rice Cardamon-Baked goods, fruit, soups Celery Powder or seed-Salads, salad dressings, sauces, meatloaf, soup, bread.Do not use  celery salt Chervil-Meats, salads, fish, eggs, vegetables, cottage cheese (parsley-like flavor) Chili Power-Meatloaf, chicken cheese, corn, eggplant, egg dishes Chives-Salads cottage cheese, egg dishes, soups, vegetables, sauces Cilantro-Salsa, casseroles Cinnamon-Baked goods, fruit, pork, lamb, chicken, carrots Cloves-Fruit, baked goods, fish, pot roast, green beans, beets, carrots Coriander-Pastry, cookies, meat, salads, cheese (lemon-orange flavor) Cumin-Meatloaf, fish,cheese, eggs, cabbage,fruit pie (caraway flavor) [employer], fruit, eggs, fish, poultry, cottage cheese, vegetables Dill Seed-Meat, cottage cheese, poultry, vegetables, fish, salads, bread Fennel Seed-Bread, cookies, apples, pork, eggs, fish, beets, cabbage, cheese, Licorice-like flavor Garlic-(buds or powder) Salads, meat, poultry, fish, bread, butter, vegetables, potatoes.Do not  use garlic salt Ginger-Fruit, vegetables, baked goods, meat, fish, poultry Horseradish Root-Meet, vegetables, butter Lemon Juice or Extract-Vegetables, fruit, tea, baked goods, fish salads Mace-Baked goods fruit, vegetables, fish, poultry (taste like nutmeg) Maple Extract-Syrups Marjoram-Meat, chicken, fish, vegetables, breads, green salads (taste like Sage) Mint-Tea, lamb, sherbet, vegetables, desserts, carrots, cabbage Mustard, Dry or Seed-Cheese, eggs, meats, vegetables, poultry Nutmeg-Baked goods, fruit, chicken, eggs, vegetables, desserts Onion Powder-Meat, fish, poultry, vegetables, cheese, eggs, bread, rice salads (Do not use   Onion salt) Orange Extract-Desserts, baked goods Oregano-Pasta, eggs, cheese, onions, pork, lamb, fish, chicken, vegetables, green salads Paprika-Meat, fish, poultry, eggs, cheese, vegetables Parsley Flakes-Butter, vegetables, meat fish,  poultry, eggs, bread, salads (certain forms may   Contain sodium Pepper-Meat fish, poultry, vegetables, eggs Peppermint Extract-Desserts, baked goods Poppy Seed-Eggs, bread, cheese, fruit dressings, baked goods, noodles, vegetables, cottage  [employer], poultry, meat, fish, cauliflower, turnips,eggs bread Saffron-Rice, bread, veal, chicken, fish, eggs Sage-Meat, fish, poultry, onions, eggplant, tomateos, pork, stews Savory-Eggs, salads, poultry, meat, rice, vegetables, soups, pork Tarragon-Meat, poultry, fish, eggs, butter, vegetables (licorice-like flavor)  Thyme-Meat, poultry, fish, eggs, vegetables, (clover-like flavor), sauces, soups Tumeric-Salads, butter, eggs, fish, rice, vegetables (saffron-like flavor) Vanilla Extract-Baked goods, candy Vinegar-Salads, vegetables, meat marinades Walnut Extract-baked goods, candy   2. Choose your Foods Wisely   The following is a list of foods to avoid which are high in sodium:  Meats-Avoid all smoked, canned, salt cured, dried and kosher meat and fish as well as Anchovies   [employer] meats:Bologna, Liverwurst, Pastrami Canned meat or fish  Marinated herring Caviar    Pepperoni Corned Beef   Pizza Dried chipped beef  Salami Frozen breaded fish or meat Salt pork Frankfurters or hot dogs  Sardines Gefilte fish   Sausage Ham (boiled ham, Proscuitto Smoked butt    spiced ham)   Spam      TV Dinners Vegetables Canned vegetables (Regular) Relish Canned mushrooms  Sauerkraut Olives    Tomato juice Pickles  Bakery and Dessert Products Canned puddings  Cream pies Cheesecake   Decorated cakes Cookies  Beverages/Juices Tomato juice, regular  Gatorade   V-8 vegetable juice, regular  Breads and Cereals Biscuit mixes   Salted potato chips, corn chips, pretzels Bread stuffing mixes  Salted crackers and rolls Pancake and waffle mixes  Self-rising flour  Seasonings Accent    Meat  sauces Barbecue sauce  Meat tenderizer Catsup    Monosodium glutamate (MSG) Celery salt   Onion salt Chili sauce   Prepared mustard Garlic salt   Salt, seasoned salt, sea salt Gravy mixes   Soy sauce Horseradish   Steak sauce Ketchup   Tartar sauce Lite salt    Teriyaki sauce Marinade mixes   Worcestershire sauce  Others Baking powder   Cocoa and cocoa mixes Baking soda   Commercial casserole mixes Candy-caramels, chocolate  Dehydrated soups    Bars, fudge,nougats  Instant rice and pasta mixes Canned broth or soup  Maraschino cherries Cheese, aged and processed cheese and cheese spreads  Learning Assessment Quiz  Indicated T (for True) or F (for False) for each of the following statements:  _____ Fresh fruits and vegetables and unprocessed grains are generally low in sodium _____ Water may contain a considerable amount of sodium, depending on the source _____ You can always tell if a food is high in sodium by tasting it _____ Certain laxatives my be high in sodium and should be avoided unless prescribed   by a physician or pharmacist _____ Salt substitutes may be used freely by anyone on a sodium restricted diet _____ Sodium is present in table salt, food additives and as a natural component of   most foods _____ Table salt is approximately 90% sodium _____ Limiting sodium intake may help prevent excess fluid accumulation in the body _____ On a sodium-restricted diet, seasonings such as bouillon soy sauce, and    cooking wine should be used in place of table salt _____ On an ingredient list, a product which lists monosodium glutamate as the first   ingredient is an appropriate food to include on a low sodium diet  Circle the best answer(s) to the following statements (Hint: there may be more than one correct answer)  11. On a low-sodium diet, some acceptable snack items are:    A. Olives  F. Bean dip   K. Grapefruit juice    B. Salted Pretzels G. Commercial Popcorn   L.  Canned peaches    C. Carrot Sticks  H. Bouillon   M. Unsalted nuts   D. [place] fries  I. Peanut butter crackers N. Salami   E. Sweet pickles J. Tomato Juice   O. Pizza  12.  Seasonings that may be used freely on a reduced - sodium diet include   A. Lemon wedges F.Monosodium glutamate K. Celery seed    B.Soysauce   G. Pepper   L. Mustard powder   C. Sea salt  H. Cooking wine  M. Onion flakes   D. Vinegar  E. Prepared horseradish N. Salsa   E. Sage   J. Worcestershire sauce  O. Chutney

## 2021-10-14 ENCOUNTER — Ambulatory Visit (INDEPENDENT_AMBULATORY_CARE_PROVIDER_SITE_OTHER): Payer: Medicare Other

## 2021-10-14 DIAGNOSIS — I495 Sick sinus syndrome: Secondary | ICD-10-CM

## 2021-10-14 LAB — CUP PACEART REMOTE DEVICE CHECK
Battery Remaining Longevity: 99 mo
Battery Voltage: 2.99 V
Brady Statistic AP VP Percent: 0.04 %
Brady Statistic AP VS Percent: 99.89 %
Brady Statistic AS VP Percent: 0 %
Brady Statistic AS VS Percent: 0.07 %
Brady Statistic RA Percent Paced: 99.94 %
Brady Statistic RV Percent Paced: 0.04 %
Date Time Interrogation Session: 20230222004129
Implantable Lead Implant Date: 20080902
Implantable Lead Implant Date: 20080902
Implantable Lead Location: 753859
Implantable Lead Location: 753860
Implantable Lead Model: 5076
Implantable Lead Model: 5076
Implantable Pulse Generator Implant Date: 20180509
Lead Channel Impedance Value: 380 Ohm
Lead Channel Impedance Value: 380 Ohm
Lead Channel Impedance Value: 456 Ohm
Lead Channel Impedance Value: 513 Ohm
Lead Channel Pacing Threshold Amplitude: 0.75 V
Lead Channel Pacing Threshold Amplitude: 1 V
Lead Channel Pacing Threshold Pulse Width: 0.4 ms
Lead Channel Pacing Threshold Pulse Width: 0.4 ms
Lead Channel Sensing Intrinsic Amplitude: 0.875 mV
Lead Channel Sensing Intrinsic Amplitude: 0.875 mV
Lead Channel Sensing Intrinsic Amplitude: 23.375 mV
Lead Channel Sensing Intrinsic Amplitude: 23.375 mV
Lead Channel Setting Pacing Amplitude: 2 V
Lead Channel Setting Pacing Amplitude: 2.5 V
Lead Channel Setting Pacing Pulse Width: 0.4 ms
Lead Channel Setting Sensing Sensitivity: 0.9 mV

## 2021-10-19 ENCOUNTER — Telehealth: Payer: Self-pay | Admitting: Physician Assistant

## 2021-10-19 ENCOUNTER — Other Ambulatory Visit (HOSPITAL_COMMUNITY)
Admission: RE | Admit: 2021-10-19 | Discharge: 2021-10-19 | Disposition: A | Discharge status: Home / Self Care | Payer: Medicare Other | Source: Ambulatory Visit | Attending: Physician Assistant | Admitting: Physician Assistant

## 2021-10-19 ENCOUNTER — Other Ambulatory Visit: Payer: Self-pay | Admitting: Cardiology

## 2021-10-19 DIAGNOSIS — I10 Essential (primary) hypertension: Secondary | ICD-10-CM | POA: Diagnosis present

## 2021-10-19 LAB — BASIC METABOLIC PANEL
Anion gap: 3 — ABNORMAL LOW (ref 5–15)
BUN: 17 mg/dL (ref 8–23)
CO2: 28 mmol/L (ref 22–32)
Calcium: 8.5 mg/dL — ABNORMAL LOW (ref 8.9–10.3)
Chloride: 106 mmol/L (ref 98–111)
Creatinine, Ser: 0.84 mg/dL (ref 0.61–1.24)
GFR, Estimated: >60 mL/min (ref >60)
Glucose, Bld: 134 mg/dL — ABNORMAL HIGH (ref 70–99)
Potassium: 3.8 mmol/L (ref 3.5–5.1)
Sodium: 137 mmol/L (ref 135–145)

## 2021-10-19 NOTE — Telephone Encounter (Signed)
results discussed with pt,copied pcp

## 2021-10-19 NOTE — Telephone Encounter (Signed)
Patient returned phone call, transferred to RN.

## 2021-10-21 NOTE — Progress Notes (Signed)
Remote pacemaker transmission.   

## 2021-10-26 NOTE — Telephone Encounter (Signed)
Patient given message from.   ?Imogene Burn, PA-C  You; Louretta Shorten, April, CMA 3 days ago  ? ?BP seems well controlled on current meds. Thanks for sending. No changes. Selinda Eon   ? ? ?Patient asked if he should stay on Cozaar 75 mg that he took while obtaining the blood pressures he sent in.  I told him yes. ?

## 2021-10-30 ENCOUNTER — Other Ambulatory Visit: Payer: Self-pay

## 2021-10-30 ENCOUNTER — Ambulatory Visit (INDEPENDENT_AMBULATORY_CARE_PROVIDER_SITE_OTHER): Payer: Medicare Other | Admitting: *Deleted

## 2021-10-30 DIAGNOSIS — I4891 Unspecified atrial fibrillation: Secondary | ICD-10-CM | POA: Diagnosis not present

## 2021-10-30 DIAGNOSIS — Z5181 Encounter for therapeutic drug level monitoring: Secondary | ICD-10-CM

## 2021-10-30 LAB — POCT INR: INR: 2.3 (ref 2.0–3.0)

## 2021-10-30 NOTE — Patient Instructions (Signed)
Continue warfarin 1 tablet daily  Recheck in 6 weeks.  

## 2021-11-13 ENCOUNTER — Other Ambulatory Visit: Payer: Self-pay | Admitting: Internal Medicine

## 2021-12-15 ENCOUNTER — Ambulatory Visit (INDEPENDENT_AMBULATORY_CARE_PROVIDER_SITE_OTHER): Payer: Medicare Other | Admitting: *Deleted

## 2021-12-15 DIAGNOSIS — Z5181 Encounter for therapeutic drug level monitoring: Secondary | ICD-10-CM

## 2021-12-15 DIAGNOSIS — I4891 Unspecified atrial fibrillation: Secondary | ICD-10-CM

## 2021-12-15 LAB — POCT INR: INR: 2.3 (ref 2.0–3.0)

## 2021-12-15 NOTE — Patient Instructions (Signed)
Continue warfarin 1 tablet daily  Recheck in 6 weeks.  

## 2022-01-13 ENCOUNTER — Ambulatory Visit (INDEPENDENT_AMBULATORY_CARE_PROVIDER_SITE_OTHER): Payer: Medicare Other

## 2022-01-13 DIAGNOSIS — I495 Sick sinus syndrome: Secondary | ICD-10-CM | POA: Diagnosis not present

## 2022-01-13 LAB — CUP PACEART REMOTE DEVICE CHECK
Battery Remaining Longevity: 95 mo
Battery Voltage: 2.99 V
Brady Statistic AP VP Percent: 0.07 %
Brady Statistic AP VS Percent: 99.85 %
Brady Statistic AS VP Percent: 0 %
Brady Statistic AS VS Percent: 0.08 %
Brady Statistic RA Percent Paced: 99.93 %
Brady Statistic RV Percent Paced: 0.07 %
Date Time Interrogation Session: 20230524014226
Implantable Lead Implant Date: 20080902
Implantable Lead Implant Date: 20080902
Implantable Lead Location: 753859
Implantable Lead Location: 753860
Implantable Lead Model: 5076
Implantable Lead Model: 5076
Implantable Pulse Generator Implant Date: 20180509
Lead Channel Impedance Value: 361 Ohm
Lead Channel Impedance Value: 399 Ohm
Lead Channel Impedance Value: 475 Ohm
Lead Channel Impedance Value: 494 Ohm
Lead Channel Pacing Threshold Amplitude: 0.75 V
Lead Channel Pacing Threshold Amplitude: 1.125 V
Lead Channel Pacing Threshold Pulse Width: 0.4 ms
Lead Channel Pacing Threshold Pulse Width: 0.4 ms
Lead Channel Sensing Intrinsic Amplitude: 1.75 mV
Lead Channel Sensing Intrinsic Amplitude: 1.75 mV
Lead Channel Sensing Intrinsic Amplitude: 22 mV
Lead Channel Sensing Intrinsic Amplitude: 22 mV
Lead Channel Setting Pacing Amplitude: 2 V
Lead Channel Setting Pacing Amplitude: 2.5 V
Lead Channel Setting Pacing Pulse Width: 0.4 ms
Lead Channel Setting Sensing Sensitivity: 0.9 mV

## 2022-01-26 ENCOUNTER — Ambulatory Visit (INDEPENDENT_AMBULATORY_CARE_PROVIDER_SITE_OTHER): Payer: Medicare Other | Admitting: *Deleted

## 2022-01-26 DIAGNOSIS — I4891 Unspecified atrial fibrillation: Secondary | ICD-10-CM

## 2022-01-26 DIAGNOSIS — Z5181 Encounter for therapeutic drug level monitoring: Secondary | ICD-10-CM | POA: Diagnosis not present

## 2022-01-26 LAB — POCT INR: INR: 1.8 — AB (ref 2.0–3.0)

## 2022-01-26 NOTE — Progress Notes (Signed)
Remote pacemaker transmission.   

## 2022-01-26 NOTE — Patient Instructions (Signed)
Take warfarin 1 1/2 tablets tonight then resume 1 tablet daily  Recheck in 6 weeks.

## 2022-03-09 ENCOUNTER — Ambulatory Visit (INDEPENDENT_AMBULATORY_CARE_PROVIDER_SITE_OTHER): Payer: Medicare Other | Admitting: *Deleted

## 2022-03-09 DIAGNOSIS — I4891 Unspecified atrial fibrillation: Secondary | ICD-10-CM

## 2022-03-09 DIAGNOSIS — Z5181 Encounter for therapeutic drug level monitoring: Secondary | ICD-10-CM | POA: Diagnosis not present

## 2022-03-09 LAB — POCT INR: INR: 2.7 (ref 2.0–3.0)

## 2022-03-09 NOTE — Patient Instructions (Signed)
Continue warfarin 1 tablet daily  Recheck in 6 weeks.  

## 2022-04-14 ENCOUNTER — Ambulatory Visit (INDEPENDENT_AMBULATORY_CARE_PROVIDER_SITE_OTHER): Payer: Medicare Other

## 2022-04-14 ENCOUNTER — Encounter: Payer: Self-pay | Admitting: Cardiology

## 2022-04-14 ENCOUNTER — Ambulatory Visit: Payer: Medicare Other | Admitting: Cardiology

## 2022-04-14 VITALS — BP 134/80 | HR 79 | Ht 66.0 in | Wt 157.0 lb

## 2022-04-14 DIAGNOSIS — I495 Sick sinus syndrome: Secondary | ICD-10-CM

## 2022-04-14 DIAGNOSIS — E782 Mixed hyperlipidemia: Secondary | ICD-10-CM | POA: Diagnosis not present

## 2022-04-14 DIAGNOSIS — I10 Essential (primary) hypertension: Secondary | ICD-10-CM

## 2022-04-14 DIAGNOSIS — I4891 Unspecified atrial fibrillation: Secondary | ICD-10-CM

## 2022-04-14 DIAGNOSIS — I2510 Atherosclerotic heart disease of native coronary artery without angina pectoris: Secondary | ICD-10-CM

## 2022-04-14 MED ORDER — ROSUVASTATIN CALCIUM 40 MG PO TABS: 40.0000 mg | ORAL_TABLET | Freq: Every day | ORAL | 3 refills | Status: DC

## 2022-04-14 NOTE — Patient Instructions (Signed)
Medication Instructions:  Your physician has recommended you make the following change in your medication:  -Stop Lipitor -Start Crestor 40 mg tablets daily   Labwork: None  Testing/Procedures: None  Follow-Up: Follow up with Dr. Harl Bowie in 6 months.   Any Other Special Instructions Will Be Listed Below (If Applicable).     If you need a refill on your cardiac medications before your next appointment, please call your pharmacy.

## 2022-04-14 NOTE — Progress Notes (Signed)
Clinical Summary [name] is a 81 y.o.male seen today for follow up of the following medical problems    1. AAA   - followed by vascular   - 2021 AAA ectatic, stable 3 x 3 cm.    2. HTN   - He has had some white coat HTN in the past, typically elevated in clinic with normal home numbers - home bp's 120s/70s, has printed out computer copy with graph today   3. Parox Afib   -on sotalol 160 mg bid, on approx since 2010.  - Has not been interested in DOACs, has elected to stay on coumadin.    -- no palpitations - compliant with meds - no bleeding on coumadin. Remains not interested in DOACs.      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 CAD   -remote history per old clinic notes, prior angioplasty in 1996 and 1997 at [facility]    - no recent symptoms.    6. Hyperlipidemia   - reports recent labs with pcp - 12/2016 TC 145 TG 41 HDL 61 LDL 76 08/2021 TC 151 TG 48 HDL 63 LDL 78   7. OSA   - followed by [place] neuro, compliant with CPAP        SH: [employer] courses. 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    2 grand daugthers just graduated from high school. Both are heading to college, one to [employer] and the other to a local school in [place].    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 2008   USES C-PAP     Allergies  Allergen Reactions   Fexofenadine Hcl Palpitations    Allegra     Current Outpatient Medications  Medication Sig Dispense Refill    atorvastatin (LIPITOR) 80 MG tablet TAKE ONE TABLET BY MOUTH DAILY. 30 tablet 11   Ascorbic Acid (VITAMIN C) 1000 MG tablet 500 mg.      Cholecalciferol (VITAMIN D3) 50 MCG (2000 UT) CAPS Take 2,000 Units by mouth daily.     diltiazem (CARDIZEM CD) 240 MG 24 hr capsule Take 240 mg by mouth at bedtime.      EASYMAX TEST test strip USE TO TEST BLOOD SUGARATWICE DAILY.     Lancets (STERILANCE TL) MISC USE TO TEST BLOOG SUGARWTWICE DAILY.     levothyroxine (SYNTHROID) 88 MCG tablet Take 88 mcg by mouth daily before breakfast.      losartan (COZAAR) 50 MG tablet Take 1.5 tablets (75 mg total) by mouth daily. 135 tablet 3   Polyethyl Glycol-Propyl Glycol (SYSTANE ULTRA) 0.4-0.3 % SOLN Apply 1-2 drops to eye daily as needed (FOR DRY EYE RELIEF).     saw palmetto 500 MG capsule Take 500 mg by mouth 2 (two) times daily.      sotalol (BETAPACE) 160 MG tablet TAKE (1) TABLET BY MOUTH TWICE DAILY. 180 tablet 3  warfarin (COUMADIN) 5 MG tablet TAKE (1) TABLET BY MOUTH DAILY AS DIRECTED. 100 tablet 4   zinc gluconate 50 MG tablet 50 mg daily.     No current facility-administered medications for this visit.     Past Surgical History:  Procedure Laterality Date   [place]  06/18/1996   mid-circ tandem overlying stents with 20%,70%mid-LAD naroowin stent site in circ with 70% stenosis in the mid to distal marginal Johnny Reynolds,tandem 95% and 80% stenosis in the proximal portion of the posterior descending Johnny Reynolds of RCA    CATARACT EXTRACTION  1983   COLONOSCOPY N/A 09/04/2014   Procedure: COLONOSCOPY;  Surgeon: Johnny Houston, MD;  Location: [facility];  Service: Endoscopy;  Laterality: N/A;  1030   CORONARY ANGIOPLASTY WITH STENT PLACEMENT  08/18/1995   PTA/STENT CIRC   CORONARY ANGIOPLASTY WITH STENT PLACEMENT  06/18/1996   PTCA  posterior descending  Johnny Reynolds of the RCA 95 AND 80% TO LESSTHAN 20%   CORONARY STENT PLACEMENT  1996   DOPPLER ECHOCARDIOGRAPHY   04/17/2010   EF =50-55%; mild concentric LVH,LAE, AORTIC SCLEROSIS.TRACE TO MILD CENTRAL AI, TRACE MITRAL REGURGITATION, CANNOT ASSESS DIASTOLIC FUNCTIONDUE TO UNDERLYING RHYTHM,PACERMAKER WIRE IN THE RA/RV   DOPPLER ECHOCARDIOGRAPHY  04/21/2007   DONE IN [place]-- MILD MITRAL REGURG., MILD TRICUSPID Johnny Reynolds. WITH MILD PULM. HTN, MILD CONCENTRIC LEFT VENTRICULAR HYPERTROPHY,MILD LEFT ATRIAL ENLARGEMENT.   EYE SURGERY  1983   Cataract   HERNIA REPAIR Bilateral 2000   INGUINAL HERNIA REPAIR Right 12/13/2018   Procedure: HERNIA REPAIR INGUINAL ADULT WITH MESH;  Surgeon: Johnny Signs, MD;  Location: [facility];  Service: General;  Laterality: Right;   INSERT / REPLACE / REMOVE PACEMAKER  SEPT 2008   INSERT DUAL -CHAMBER-medtronic adapta ADDR01 serial   Johnny Reynolds   [place] W/SPECT  04/17/2010   EF 50%; LOW NORMAL LV FUNCTION,   PACEMAKER INSERTION  2008   DUAL-CHAMBER   PPM GENERATOR CHANGEOUT N/A 12/29/2016   Procedure: PPM Generator Changeout;  Surgeon: Johnny Lance, MD;  Location: [place] [facility];  Service: Cardiovascular;  Laterality: N/A;     Allergies  Allergen Reactions   Fexofenadine Hcl Palpitations    Allegra      Family History  Problem Relation Age of Onset   Peripheral vascular disease Mother    Hypertension Mother    Diabetes Mother    Heart disease Mother    Hyperlipidemia Mother    Stroke Father 58       TIA   Peripheral vascular disease Sister    Cancer Sister    Diabetes Sister    Heart disease Sister        Stent- Heart Disease before age 42   Hyperlipidemia Sister    Hypertension Sister    Peripheral vascular disease Brother    Diabetes Brother    Heart disease Brother        CHF and Heart Disease before age 46   Hyperlipidemia Brother    Hypertension Brother    Sleep apnea Neg Hx      Social History Johnny Reynolds reports that he has never smoked. He has never used smokeless tobacco. Johnny Reynolds reports no history of alcohol use.   Review  of Systems CONSTITUTIONAL: No weight loss, fever, chills, weakness or fatigue.  HEENT: Eyes: No visual loss, blurred vision, double vision or yellow sclerae.No hearing loss, sneezing, congestion, runny nose or sore throat.  SKIN: No rash or  itching.  CARDIOVASCULAR: per hpi RESPIRATORY: No shortness of breath, cough or sputum.  GASTROINTESTINAL: No anorexia, nausea, vomiting or diarrhea. No abdominal pain or blood.  GENITOURINARY: No burning on urination, no polyuria NEUROLOGICAL: No headache, dizziness, syncope, paralysis, ataxia, numbness or tingling in the extremities. No change in bowel or bladder control.  MUSCULOSKELETAL: No muscle, back pain, joint pain or stiffness.  LYMPHATICS: No enlarged nodes. No history of splenectomy.  PSYCHIATRIC: No history of depression or anxiety.  ENDOCRINOLOGIC: No reports of sweating, cold or heat intolerance. No polyuria or polydipsia.  Marland Kitchen   Physical Examination Today's Vitals   04/14/22 1046  BP: 134/80  Pulse: 79  SpO2: 99%  Weight: 157 lb (71.2 kg)  Height: '5\' 6"'$  (1.676 m)   Body mass index is 25.34 kg/m.  Gen: resting comfortably, no acute distress HEENT: no scleral icterus, pupils equal round and reactive, no palptable cervical adenopathy,  CV: irreg, no m/r/g no jvd Resp: Clear to auscultation bilaterally GI: abdomen is soft, non-tender, non-distended, normal bowel sounds, no hepatosplenomegaly MSK: extremities are warm, no edema.  Skin: warm, no rash Neuro:  no focal deficits Psych: appropriate affect   Diagnostic Studies  Echo 03/2010 [facility]: LVEF 50-55%, mild LVH, LAE     03/2010 [facility]: no ischemia, LVEF 50%.    Assessment and Plan   1. AAA,   -mild and stable for several years, no longer monitoring   2. CAD   - no symptoms, continue curren tmeds   3. Parox afib   -no symptoms, continue current meds - he is not in favor of DOACs, remain on coumadin   4. Tachy-brady syndrome/Pacemaker - normal device check  within last few months - no symptoms - continue to monitor   5. HL: - above goal, change atorva to crestor '40mg'$  daily   6. HTN - elevated in clinic but detailed home data at goal, normal pattern for him - continue current meds  F/u 6 months   Arnoldo Lenis, M.D.

## 2022-04-15 LAB — CUP PACEART REMOTE DEVICE CHECK
Battery Remaining Longevity: 90 mo
Battery Voltage: 2.98 V
Brady Statistic AP VP Percent: 0.07 %
Brady Statistic AP VS Percent: 99.85 %
Brady Statistic AS VP Percent: 0 %
Brady Statistic AS VS Percent: 0.08 %
Brady Statistic RA Percent Paced: 99.93 %
Brady Statistic RV Percent Paced: 0.07 %
Date Time Interrogation Session: 20230823014324
Implantable Lead Implant Date: 20080902
Implantable Lead Implant Date: 20080902
Implantable Lead Location: 753859
Implantable Lead Location: 753860
Implantable Lead Model: 5076
Implantable Lead Model: 5076
Implantable Pulse Generator Implant Date: 20180509
Lead Channel Impedance Value: 361 Ohm
Lead Channel Impedance Value: 361 Ohm
Lead Channel Impedance Value: 437 Ohm
Lead Channel Impedance Value: 475 Ohm
Lead Channel Pacing Threshold Amplitude: 0.75 V
Lead Channel Pacing Threshold Amplitude: 1.125 V
Lead Channel Pacing Threshold Pulse Width: 0.4 ms
Lead Channel Pacing Threshold Pulse Width: 0.4 ms
Lead Channel Sensing Intrinsic Amplitude: 1.25 mV
Lead Channel Sensing Intrinsic Amplitude: 1.25 mV
Lead Channel Sensing Intrinsic Amplitude: 20.625 mV
Lead Channel Sensing Intrinsic Amplitude: 20.625 mV
Lead Channel Setting Pacing Amplitude: 2 V
Lead Channel Setting Pacing Amplitude: 2.5 V
Lead Channel Setting Pacing Pulse Width: 0.4 ms
Lead Channel Setting Sensing Sensitivity: 0.9 mV

## 2022-04-19 ENCOUNTER — Other Ambulatory Visit: Payer: Self-pay

## 2022-04-19 ENCOUNTER — Emergency Department (HOSPITAL_COMMUNITY): Payer: Medicare Other

## 2022-04-19 ENCOUNTER — Encounter (HOSPITAL_COMMUNITY): Payer: Self-pay | Admitting: *Deleted

## 2022-04-19 ENCOUNTER — Emergency Department (HOSPITAL_COMMUNITY)
Admission: EM | Admit: 2022-04-19 | Discharge: 2022-04-19 | Disposition: A | Discharge status: Home / Self Care | Payer: Medicare Other | Attending: Emergency Medicine | Admitting: Emergency Medicine

## 2022-04-19 DIAGNOSIS — I6782 Cerebral ischemia: Secondary | ICD-10-CM | POA: Insufficient documentation

## 2022-04-19 DIAGNOSIS — Z7901 Long term (current) use of anticoagulants: Secondary | ICD-10-CM | POA: Insufficient documentation

## 2022-04-19 DIAGNOSIS — Y900 Blood alcohol level of less than 20 mg/100 ml: Secondary | ICD-10-CM | POA: Insufficient documentation

## 2022-04-19 DIAGNOSIS — Z79899 Other long term (current) drug therapy: Secondary | ICD-10-CM | POA: Diagnosis not present

## 2022-04-19 DIAGNOSIS — R079 Chest pain, unspecified: Secondary | ICD-10-CM | POA: Insufficient documentation

## 2022-04-19 DIAGNOSIS — Z20822 Contact with and (suspected) exposure to covid-19: Secondary | ICD-10-CM | POA: Diagnosis not present

## 2022-04-19 DIAGNOSIS — I10 Essential (primary) hypertension: Secondary | ICD-10-CM | POA: Diagnosis not present

## 2022-04-19 LAB — RESP PANEL BY RT-PCR (FLU A&B, COVID) ARPGX2
Influenza A by PCR: NEGATIVE
Influenza B by PCR: NEGATIVE
SARS Coronavirus 2 by RT PCR: NEGATIVE

## 2022-04-19 LAB — CBC
HCT: 40 % (ref 39.0–52.0)
Hemoglobin: 13.4 g/dL (ref 13.0–17.0)
MCH: 30.2 pg (ref 26.0–34.0)
MCHC: 33.5 g/dL (ref 30.0–36.0)
MCV: 90.1 fL (ref 80.0–100.0)
Platelets: 157 10*3/uL (ref 150–400)
RBC: 4.44 MIL/uL (ref 4.22–5.81)
RDW: 14.8 % (ref 11.5–15.5)
WBC: 4.8 10*3/uL (ref 4.0–10.5)
nRBC: 0 % (ref 0.0–0.2)

## 2022-04-19 LAB — URINALYSIS, ROUTINE W REFLEX MICROSCOPIC
Bilirubin Urine: NEGATIVE
Glucose, UA: NEGATIVE mg/dL
Hgb urine dipstick: NEGATIVE
Ketones, ur: NEGATIVE mg/dL
Leukocytes,Ua: NEGATIVE
Nitrite: NEGATIVE
Protein, ur: NEGATIVE mg/dL
Specific Gravity, Urine: 1.009 (ref 1.005–1.030)
pH: 7 (ref 5.0–8.0)

## 2022-04-19 LAB — BASIC METABOLIC PANEL
Anion gap: 7 (ref 5–15)
BUN: 19 mg/dL (ref 8–23)
CO2: 25 mmol/L (ref 22–32)
Calcium: 8.5 mg/dL — ABNORMAL LOW (ref 8.9–10.3)
Chloride: 108 mmol/L (ref 98–111)
Creatinine, Ser: 0.87 mg/dL (ref 0.61–1.24)
GFR, Estimated: >60 mL/min (ref >60)
Glucose, Bld: 133 mg/dL — ABNORMAL HIGH (ref 70–99)
Potassium: 3.3 mmol/L — ABNORMAL LOW (ref 3.5–5.1)
Sodium: 140 mmol/L (ref 135–145)

## 2022-04-19 LAB — RAPID URINE DRUG SCREEN, HOSP PERFORMED
Amphetamines: NOT DETECTED
Barbiturates: NOT DETECTED
Benzodiazepines: NOT DETECTED
Cocaine: NOT DETECTED
Opiates: NOT DETECTED
Tetrahydrocannabinol: NOT DETECTED

## 2022-04-19 LAB — APTT: aPTT: 34 seconds (ref 24–36)

## 2022-04-19 LAB — TROPONIN I (HIGH SENSITIVITY)
Troponin I (High Sensitivity): 4 ng/L (ref <18)
Troponin I (High Sensitivity): 4 ng/L (ref <18)

## 2022-04-19 LAB — PROTIME-INR
INR: 2.5 — ABNORMAL HIGH (ref 0.8–1.2)
Prothrombin Time: 26.9 seconds — ABNORMAL HIGH (ref 11.4–15.2)

## 2022-04-19 LAB — ETHANOL: Alcohol, Ethyl (B): <10 mg/dL (ref <10)

## 2022-04-19 LAB — CBG MONITORING, ED: Glucose-Capillary: 118 mg/dL — ABNORMAL HIGH (ref 70–99)

## 2022-04-19 MED ORDER — ASPIRIN 81 MG PO CHEW
324.0000 mg | CHEWABLE_TABLET | Freq: Once | ORAL | Status: AC
Start: 2022-04-19 — End: 2022-04-19
  Administered 2022-04-19: 324 mg via ORAL
  Filled 2022-04-19: qty 4

## 2022-04-19 NOTE — ED Provider Notes (Signed)
[facility] EMERGENCY DEPARTMENT Provider Note   CSN: [id number] Arrival date & time: 04/19/22  6295     History  Chief Complaint  Patient presents with   Chest Pain    RUE TINNEL is a 81 y.o. male.  HPI   Patient is a 81 year old male, history of high cholesterol, history of hypertension and also taking warfarin, reports that he has a history of multiple small blockages, reports that he was switched from Lipitor to Crestor within the last couple of weeks but has tolerated it very well.  He states that this morning when he woke up he feels like he is off balance and having some difficulty ambulating because he feels like he is off balance.  There is no changes in his vision or speech and he has no focal weakness.  Paramedics report that he seemed a little bit off balance when he was walking and did complain of some intermittent chest pain in route to the hospital.  No fevers or chills no coughing or shortness of breath no nausea or vomiting no diarrhea diarrhea no swelling, the patient does have a pacemaker and a history of atrial fibrillation.  Review of the medical record shows that the patient has not had a recent echocardiogram nor has he had a stress test or heart catheterization.  No arrhythmias were noted in route and blood sugar was 150.  The patient has no difficulty with vision or speech at this time.  States he woke up with his difficulty ambulating.  He reports that he usually has a minute or 2 in the morning when he has to get his bearings when he gets out of bed but today it did not resolve and he continued to have difficulty with his balance.  He is not actively having chest pain  Home Medications Prior to Admission medications   Medication Sig Start Date End Date Taking? Authorizing Provider  Ascorbic Acid (VITAMIN C) 1000 MG tablet 500 mg.  03/23/20   [provider]  Cholecalciferol (VITAMIN D3) 50 MCG (2000 UT) CAPS Take 2,000 Units by mouth daily.     [provider]  diltiazem (CARDIZEM CD) 240 MG 24 hr capsule Take 240 mg by mouth at bedtime.  09/25/12   [provider]  EASYMAX TEST test strip USE TO TEST BLOOD SUGARATWICE DAILY. 02/06/20   [provider]  Lancets (STERILANCE TL) MISC USE TO TEST BLOOG SUGARWTWICE DAILY. 01/24/20   [provider]  levothyroxine (SYNTHROID) 88 MCG tablet Take 88 mcg by mouth daily before breakfast.     [provider]  losartan (COZAAR) 50 MG tablet Take 1.5 tablets (75 mg total) by mouth daily. 10/05/21 04/14/22  Imogene Burn, PA-C  RED YEAST RICE EXTRACT PO Take by mouth.    [provider]  saw palmetto 500 MG capsule Take 500 mg by mouth 2 (two) times daily.     [provider]  sotalol (BETAPACE) 160 MG tablet TAKE (1) TABLET BY MOUTH TWICE DAILY. 11/13/21   Evans Lance, MD  warfarin (COUMADIN) 5 MG tablet TAKE (1) TABLET BY MOUTH DAILY AS DIRECTED. 05/19/21   Arnoldo Lenis, MD  zinc gluconate 50 MG tablet 50 mg daily. 10/08/19   [provider]      Allergies    Fexofenadine hcl    Review of Systems   Review of Systems  All other systems reviewed and are negative.   Physical Exam Updated Vital Signs BP Marland Kitchen)  169/83   Pulse 70   Resp 16   Ht 1.676 m ('5\' 6"'$ )   Wt 71.2 kg   SpO2 100%   BMI 25.34 kg/m  Physical Exam Vitals and nursing note reviewed.  Constitutional:      General: He is not in acute distress.    Appearance: He is well-developed.  HENT:     Head: Normocephalic and atraumatic.     Mouth/Throat:     Pharynx: No oropharyngeal exudate.  Eyes:     General: No scleral icterus.       Right eye: No discharge.        Left eye: No discharge.     Conjunctiva/sclera: Conjunctivae normal.     Pupils: Pupils are equal, round, and reactive to light.  Neck:     Thyroid: No thyromegaly.     Vascular: No JVD.  Cardiovascular:     Rate and Rhythm: Normal rate and regular rhythm.     Heart sounds: Normal  heart sounds. No murmur heard.    No friction rub. No gallop.  Pulmonary:     Effort: Pulmonary effort is normal. No respiratory distress.     Breath sounds: Normal breath sounds. No wheezing or rales.  Abdominal:     General: Bowel sounds are normal. There is no distension.     Palpations: Abdomen is soft. There is no mass.     Tenderness: There is no abdominal tenderness.  Musculoskeletal:        General: No tenderness. Normal range of motion.     Cervical back: Normal range of motion and neck supple.  Lymphadenopathy:     Cervical: No cervical adenopathy.  Skin:    General: Skin is warm and dry.     Findings: No erythema or rash.  Neurological:     Mental Status: He is alert.     Coordination: Coordination normal.     Comments: Speech is clear, cranial nerves III through XII are intact, memory is intact, strength is normal in all 4 extremities including grips, sensation is intact to light touch and pinprick in all 4 extremities. Coordination as tested by finger-nose-finger is normal, no limb ataxia.  normal reflexes at the patellar tendons bilaterally  Gait is slightly ataxic  Psychiatric:        Behavior: Behavior normal.     ED Results / Procedures / Treatments   Labs (all labs ordered are listed, but only abnormal results are displayed) Labs Reviewed  BASIC METABOLIC PANEL - Abnormal; Notable for the following components:      Result Value   Potassium 3.3 (*)    Glucose, Bld 133 (*)    Calcium 8.5 (*)    All other components within normal limits  PROTIME-INR - Abnormal; Notable for the following components:   Prothrombin Time 26.9 (*)    INR 2.5 (*)    All other components within normal limits  URINALYSIS, ROUTINE W REFLEX MICROSCOPIC - Abnormal; Notable for the following components:   Color, Urine STRAW (*)    All other components within normal limits  CBG MONITORING, ED - Abnormal; Notable for the following components:   Glucose-Capillary 118 (*)    All other  components within normal limits  RESP PANEL BY RT-PCR (FLU A&B, COVID) ARPGX2  CBC  ETHANOL  APTT  RAPID URINE DRUG SCREEN, HOSP PERFORMED  I-STAT CHEM 8, ED  TROPONIN I (HIGH SENSITIVITY)  TROPONIN I (HIGH SENSITIVITY)    EKG None  Radiology CT HEAD  WO CONTRAST  Result Date: 04/19/2022 CLINICAL DATA:  Neuro deficit, acute, stroke suspected ataxia EXAM: CT HEAD WITHOUT CONTRAST TECHNIQUE: Contiguous axial images were obtained from the base of the skull through the vertex without intravenous contrast. RADIATION DOSE REDUCTION: This exam was performed according to the departmental dose-optimization program which includes automated exposure control, adjustment of the mA and/or kV according to patient size and/or use of iterative reconstruction technique. COMPARISON:  None Available. FINDINGS: Brain: No evidence of acute intracranial hemorrhage or extra-axial collection. No loss of gray-white matter differentiation.No evidence of mass lesion/concerning mass effect.The ventricles are normal in size.Scattered subcortical and periventricular white matter hypodensities, nonspecific but likely sequela of chronic small vessel ischemic disease.Mild cerebral atrophy Vascular: Vascular calcifications.  No hyperdense vessel. Skull: Negative for skull fracture. Sinuses/Orbits: Near complete opacification of the left maxillary sinus. Partial opacification of the right frontal sinus. Minimal ethmoid air cell mucosal thickening. Orbits are unremarkable. Other: None. IMPRESSION: No acute intracranial abnormality. Mild sequela of chronic small vessel ischemic disease. Paranasal sinus disease, most prominent in the left maxillary sinus, correlate for signs/symptoms of sinusitis. Electronically Signed   By: Maurine Simmering M.D.   On: 04/19/2022 10:51   DG Chest Portable 1 View  Result Date: 04/19/2022 CLINICAL DATA:  Chest pain. EXAM: PORTABLE CHEST 1 VIEW COMPARISON:  October 17, 2017. FINDINGS: The heart size and  mediastinal contours are within normal limits. Left-sided pacemaker is unchanged in position. Both lungs are clear. The visualized skeletal structures are unremarkable. IMPRESSION: No active disease. Electronically Signed   By: Marijo Conception M.D.   On: 04/19/2022 10:15    Procedures Procedures    Medications Ordered in ED Medications  aspirin chewable tablet 324 mg (324 mg Oral Given 04/19/22 1111)    ED Course/ Medical Decision Making/ A&P                           Medical Decision Making Amount and/or Complexity of Data Reviewed Labs: ordered. Radiology: ordered.  Risk OTC drugs.   This patient presents to the ED for concern of ataxia and mild chest pain, this involves an extensive number of treatment options, and is a complaint that carries with it a high risk of complications and morbidity.  The differential diagnosis includes could be neurologic, could be related to chest or cardiac issues, check labs and imaging   Co morbidities that complicate the patient evaluation  Hypertension, high cholesterol   Additional history obtained:  Additional history obtained from significant other as well as the medical record External records from outside source obtained and reviewed including office visits from cardiology regarding atrial fibrillation   Lab Tests:  I Ordered, and personally interpreted labs.  The pertinent results include: Lab work with 2 negative troponins, no other acute findings   Imaging Studies ordered:  I ordered imaging studies including chest x-ray and a CT scan of the brain I independently visualized and interpreted imaging which showed no acute signs of stroke or cardiac abnormalities I agree with the radiologist interpretation   Cardiac Monitoring: / EKG:  The patient was maintained on a cardiac monitor.  I personally viewed and interpreted the cardiac monitored which showed an underlying rhythm of: Paced rhythm   Consultations  Obtained:  Patient remained asymptomatic throughout his stay without any recurrent chest pain or ataxia, no difficulty breathing, consultations were not needed as there was 2 negative troponins and the patient stable for discharge   Problem  List / ED Course / Critical interventions / Medication management  Chest discomfort with some feeling of being off balance, I ambulated with the patient in the emergency department without any assistance and he was able to walk back and forth without any difficulty.  He has a normal neurologic exam in both the supine and the upright position, his coordination is normal his gait is normal his speech is normal and he is asymptomatic. I discussed the patient's medications with him, these seem to correlate with his symptoms.  Specifically the rosuvastatin was started within the last week and his symptoms started shortly after starting the rosuvastatin.   Reevaluation of the patient after these medicines showed that the patient improved I have reviewed the patients home medicines and have made adjustments as needed   Social Determinants of Health:  None   Test / Admission - Considered:  Considered admission but the patient has 2 negative troponins, stable for discharge to follow-up with cardiology and his family doctor         Final Clinical Impression(s) / ED Diagnoses Final diagnoses:  Chest pain, unspecified type  Essential hypertension    Rx / DC Orders ED Discharge Orders     None         Noemi Chapel, MD 04/19/22 1407

## 2022-04-19 NOTE — ED Triage Notes (Signed)
BIB [facility] from home for chest pressure and unsteady on feet, onset around 0800 after shower. L chest pacemaker present. Wife at [facility]. Recently started rosuvastatin. Alert, NAD, calm, interactive. Seen by EDP upon arrival.

## 2022-04-19 NOTE — Discharge Instructions (Signed)
The symptoms that you have had today may be as a direct result of the medication that you started called rosuvastatin, this is also known as Crestor.  I would encourage you to stop this medication immediately and follow-up with your family doctor this week for a discussion about the necessity of this medication.  Additionally you should probably see a heart doctor.  I will give you the phone number above and I would like for you to see them this week.  In the meantime if you develop worsening shortness of breath chest pain heaviness in the chest or any other concerning symptoms return to the emergency department immediately.

## 2022-04-20 ENCOUNTER — Ambulatory Visit: Payer: Medicare Other | Attending: Cardiology | Admitting: *Deleted

## 2022-04-20 DIAGNOSIS — I4891 Unspecified atrial fibrillation: Secondary | ICD-10-CM | POA: Diagnosis not present

## 2022-04-20 DIAGNOSIS — Z5181 Encounter for therapeutic drug level monitoring: Secondary | ICD-10-CM

## 2022-04-20 LAB — POCT INR: INR: 3 (ref 2.0–3.0)

## 2022-04-20 NOTE — Patient Instructions (Signed)
Continue warfarin 1 tablet daily  Recheck in 6 weeks.  

## 2022-05-10 NOTE — Progress Notes (Signed)
Cardiology Office Note:    Date:  05/17/2022   ID:  Johnny Reynolds, DOB 09-23-1940, MRN [id number]  PCP:  Sharilyn Sites, [place] [facility] Cardiologist:  Carlyle Dolly, MD     Referring MD: Sharilyn Sites, MD   Chief Complaint:  No chief complaint on file.     History of Present Illness:   Johnny Reynolds is a 81 y.o. male with history of paroxysmal atrial fibrillation on sotalol since 2010 also on Coumadin not interested in DOACs, AAA followed by vascular, dual-chamber permanent pacemaker, CAD prior angioplasty in 1996 in 1997, hyperlipidemia, OSA on CPAP followed by [facility].  Patient saw Dr. Harl Bowie 04/14/2022 and was doing well.     Patient went to the ED 04/19/2022 feeling off balance and some chest discomfort.  Troponins were negative.  Symptoms started after he began rosuvastatin.  Patient says he feels much better off rosuvastatin. No further chest pain or off balance. BP up today but has white coat syndrome. Yest 130/70 similar today. Goes to gym 3 days/week, treadmill 30 min, weights.     Past Medical History:  Diagnosis Date   AAA (abdominal aortic aneurysm) ([place])    Arrhythmia    Atrial fibrillation (HCC)    Back pain, chronic    Coronary artery disease 1996 and 1997   angioplasty of LAD   Diabetes mellitus without complication ([place])    MILD TYPE 2   High cholesterol    Hypertension    Left shoulder pain 2014   Sleep apnea Sept. 2008   USES C-PAP   Current Medications: Current Meds  Medication Sig   Ascorbic Acid (VITAMIN C) 1000 MG tablet 500 mg.    Cholecalciferol (VITAMIN D3) 50 MCG (2000 UT) CAPS Take 2,000 Units by mouth daily.   diltiazem (CARDIZEM CD) 240 MG 24 hr capsule Take 240 mg by mouth at bedtime.    EASYMAX TEST test strip USE TO TEST BLOOD SUGARATWICE DAILY.   Lancets (STERILANCE TL) MISC USE TO TEST BLOOG SUGARWTWICE DAILY.   levothyroxine (SYNTHROID) 88 MCG tablet Take 88 mcg by mouth daily before breakfast.     losartan (COZAAR) 50 MG tablet Take 1.5 tablets (75 mg total) by mouth daily.   saw palmetto 500 MG capsule Take 500 mg by mouth 2 (two) times daily.    sotalol (BETAPACE) 160 MG tablet TAKE (1) TABLET BY MOUTH TWICE DAILY.   warfarin (COUMADIN) 5 MG tablet TAKE (1) TABLET BY MOUTH DAILY AS DIRECTED.   zinc gluconate 50 MG tablet 50 mg daily.    Allergies:   Fexofenadine hcl   Social History   Tobacco Use   Smoking status: Never   Smokeless tobacco: Never  Vaping Use   Vaping Use: Never used  Substance Use Topics   Alcohol use: No    Alcohol/week: 0.0 standard drinks of alcohol   Drug use: No    Family Hx: The patient's family history includes Cancer in his sister; Diabetes in his brother, mother, and sister; Heart disease in his brother, mother, and sister; Hyperlipidemia in his brother, mother, and sister; Hypertension in his brother, mother, and sister; Peripheral vascular disease in his brother, mother, and sister; Stroke (age of onset: 72) in his father. There is no history of Sleep apnea.  ROS   EKGs/Labs/Other Test Reviewed:    EKG:  EKG is  not ordered today.     Recent Labs: 04/19/2022: BUN 19; Creatinine, Ser 0.87; Hemoglobin 13.4; Platelets 157; Potassium  3.3; Sodium 140   Recent Lipid Panel No results for input(s): "CHOL", "TRIG", "HDL", "VLDL", "LDLCALC", "LDLDIRECT" in the last 8760 hours.   Prior CV Studies:   Echo 03/2010 [facility]: LVEF 50-55%, mild LVH, LAE     03/2010 [facility]: no ischemia, LVEF 50%.       Risk Assessment/Calculations/Metrics:    CHA2DS2-VASc Score =     This indicates a  % annual risk of stroke. The patient's score is based upon:             Physical Exam:    VS:  BP (!) 162/76   Pulse 76   Ht '5\' 6"'$  (1.676 m)   Wt 160 lb (72.6 kg)   SpO2 97%   BMI 25.82 kg/m     Wt Readings from Last 3 Encounters:  05/17/22 160 lb (72.6 kg)  04/19/22 157 lb (71.2 kg)  04/14/22 157 lb (71.2 kg)    Physical Exam  GEN: Well nourished,  well developed, in no acute distress  neck: no JVD, carotid bruits, or masses Cardiac:RRR; no murmurs, rubs, or gallops  Respiratory:  clear to auscultation bilaterally, normal work of breathing GI: soft, nontender, nondistended, + BS Ext: without cyanosis, clubbing, or edema, Good distal pulses bilaterally Neuro:  Alert and Oriented x 3,  Psych: euthymic mood, full affect       ASSESSMENT & PLAN:   No problem-specific Assessment & Plan notes found for this encounter.   PAF on sotalol and Coumadin  Sick sinus syndrome status post permanent pacemaker  CAD remote angioplasties 1996 in 1997-no angina, exercises regularly  Hypertension-has white coat syndrome. BP regularly checked at home and 130/70.  Hyperlipidemia intolerant to statins and declines referal to lipid clinic for PCSK9I.   AAA mild and stable for years no longer monitoring          Dispo:  No follow-ups on file.   Medication Adjustments/Labs and Tests Ordered: Current medicines are reviewed at length with the patient today.  Concerns regarding medicines are outlined above.  Tests Ordered: No orders of the defined types were placed in this encounter.  Medication Changes: No orders of the defined types were placed in this encounter.  Signed, Ermalinda Barrios, PA-C  05/17/2022 1:20 PM    [facility] [place], [place], [place]  [address] Phone: [phone]; Fax: [phone]

## 2022-05-11 NOTE — Progress Notes (Signed)
Remote pacemaker transmission.   

## 2022-05-17 ENCOUNTER — Ambulatory Visit: Payer: Medicare Other | Attending: Physician Assistant | Admitting: Physician Assistant

## 2022-05-17 ENCOUNTER — Encounter: Payer: Self-pay | Admitting: Physician Assistant

## 2022-05-17 VITALS — BP 162/76 | HR 76 | Ht 66.0 in | Wt 160.0 lb

## 2022-05-17 DIAGNOSIS — I4891 Unspecified atrial fibrillation: Secondary | ICD-10-CM

## 2022-05-17 DIAGNOSIS — I10 Essential (primary) hypertension: Secondary | ICD-10-CM | POA: Diagnosis not present

## 2022-05-17 DIAGNOSIS — Z950 Presence of cardiac pacemaker: Secondary | ICD-10-CM

## 2022-05-17 DIAGNOSIS — I2510 Atherosclerotic heart disease of native coronary artery without angina pectoris: Secondary | ICD-10-CM | POA: Diagnosis not present

## 2022-05-17 DIAGNOSIS — E782 Mixed hyperlipidemia: Secondary | ICD-10-CM

## 2022-05-17 DIAGNOSIS — I714 Abdominal aortic aneurysm, without rupture, unspecified: Secondary | ICD-10-CM

## 2022-05-17 NOTE — Patient Instructions (Signed)
Medication Instructions:  Your physician recommends that you continue on your current medications as directed. Please refer to the Current Medication list given to you today.   Labwork: None  Testing/Procedures: None  Follow-Up: Keep follow up with Dr. Harl Bowie   Any Other Special Instructions Will Be Listed Below (If Applicable).     If you need a refill on your cardiac medications before your next appointment, please call your pharmacy.

## 2022-06-01 ENCOUNTER — Ambulatory Visit: Payer: Medicare Other | Attending: Cardiology | Admitting: *Deleted

## 2022-06-01 DIAGNOSIS — I4891 Unspecified atrial fibrillation: Secondary | ICD-10-CM

## 2022-06-01 DIAGNOSIS — Z5181 Encounter for therapeutic drug level monitoring: Secondary | ICD-10-CM

## 2022-06-01 LAB — POCT INR: INR: 1.8 — AB (ref 2.0–3.0)

## 2022-06-01 NOTE — Patient Instructions (Signed)
Take 1 1/2 tablets tonight then resume 1 tablet daily  Recheck in 6 weeks.

## 2022-06-14 ENCOUNTER — Encounter: Payer: Self-pay | Admitting: *Deleted

## 2022-06-14 ENCOUNTER — Telehealth: Payer: Self-pay | Admitting: Adult Health

## 2022-06-14 NOTE — Telephone Encounter (Signed)
Noted thanks °

## 2022-06-14 NOTE — Telephone Encounter (Signed)
Pt called [facility] to have CPAP data sent to him. Pt said will sent CPAP data through [facility]. Pt changed office visit back to MyChart.

## 2022-06-14 NOTE — Telephone Encounter (Signed)
Pt has CPAP data needed for Pt said, he will send CPAP through MyChart

## 2022-06-15 ENCOUNTER — Telehealth: Payer: Medicare Other | Admitting: Adult Health

## 2022-06-15 VITALS — BP 125/71 | HR 74

## 2022-06-15 DIAGNOSIS — G4733 Obstructive sleep apnea (adult) (pediatric): Secondary | ICD-10-CM | POA: Diagnosis not present

## 2022-06-15 NOTE — Progress Notes (Signed)
PATIENT: Johnny Reynolds DOB: [birth year]  REASON FOR VISIT: follow up HISTORY FROM: patient PRIMARY NEUROLOGIST:   Virtual Visit via Video Note  I connected with Johnny Reynolds on 06/15/22 at  9:30 AM EDT by a video enabled telemedicine application located remotely at [facility] and verified that I am speaking with the correct person using two identifiers who was located at their own home.   I discussed the limitations of evaluation and management by telemedicine and the availability of in person appointments. The patient expressed understanding and agreed to proceed.   PATIENT: Johnny Reynolds DOB: 04-19-1941  REASON FOR VISIT: follow up HISTORY FROM: patient  HISTORY OF PRESENT ILLNESS: Today 06/15/22:  Mr. Boody is an 81 year old male with a history of obstructive sleep apnea on CPAP.  He returns today for virtual visit.  His download indicates that he uses machine nightly for compliance of 100%.  He uses machine greater than 4 hours 29 days for compliance of 97%.  On average he uses his machine 8 hours and 34 minutes.  His residual AHI is 0.2 on 5 to 10 cm of water with EPR 2.  Leak in the 95th percentile is 2 L/min.  Overall he feels that the CPAP is working well for him.  Denies any new symptoms.   REVIEW OF SYSTEMS: Out of a complete 14 system review of symptoms, the patient complains only of the following symptoms, and all other reviewed systems are negative.  ESS 0  ALLERGIES: Allergies  Allergen Reactions   Fexofenadine Hcl Palpitations    Allegra    HOME MEDICATIONS: Outpatient Medications Prior to Visit  Medication Sig Dispense Refill   Ascorbic Acid (VITAMIN C) 1000 MG tablet 500 mg.      Cholecalciferol (VITAMIN D3) 50 MCG (2000 UT) CAPS Take 2,000 Units by mouth daily.     diltiazem (CARDIZEM CD) 240 MG 24 hr capsule Take 240 mg by mouth at bedtime.      EASYMAX TEST test strip USE TO TEST BLOOD SUGARATWICE DAILY.     Lancets  (STERILANCE TL) MISC USE TO TEST BLOOG SUGARWTWICE DAILY.     levothyroxine (SYNTHROID) 88 MCG tablet Take 88 mcg by mouth daily before breakfast.      losartan (COZAAR) 50 MG tablet Take 1.5 tablets (75 mg total) by mouth daily. 135 tablet 3   RED YEAST RICE EXTRACT PO Take by mouth. (Patient not taking: Reported on 05/17/2022)     saw palmetto 500 MG capsule Take 500 mg by mouth 2 (two) times daily.      sotalol (BETAPACE) 160 MG tablet TAKE (1) TABLET BY MOUTH TWICE DAILY. 180 tablet 3   warfarin (COUMADIN) 5 MG tablet TAKE (1) TABLET BY MOUTH DAILY AS DIRECTED. 100 tablet 4   zinc gluconate 50 MG tablet 50 mg daily.     No facility-administered medications prior to visit.    PAST MEDICAL HISTORY: Past Medical History:  Diagnosis Date   AAA (abdominal aortic aneurysm) ([place])    Arrhythmia    Atrial fibrillation (HCC)    Back pain, chronic    Coronary artery disease 1996 and 1997   angioplasty of LAD   Diabetes mellitus without complication ([place])    MILD TYPE 2   High cholesterol    Hypertension    Left shoulder pain 2014   Sleep apnea Sept. 2008   USES C-PAP    PAST SURGICAL HISTORY: Past Surgical History:  Procedure Laterality Date  [place] CATHETERIZATION  06/18/1996   mid-circ tandem overlying stents with 20%,70%mid-LAD naroowin stent site in circ with 70% stenosis in the mid to distal marginal branch,tandem 95% and 80% stenosis in the proximal portion of the posterior descending branch of RCA    CATARACT EXTRACTION  1983   COLONOSCOPY N/A 09/04/2014   Procedure: COLONOSCOPY;  Surgeon: [name], MD;  [facility];  Service: Endoscopy;  Laterality: N/A;  1030   CORONARY ANGIOPLASTY WITH STENT PLACEMENT  08/18/1995   PTA/STENT CIRC   CORONARY ANGIOPLASTY WITH STENT PLACEMENT  06/18/1996   PTCA  posterior descending  branch of the RCA 95 AND 80% TO LESSTHAN 20%   CORONARY STENT PLACEMENT  1996   DOPPLER ECHOCARDIOGRAPHY   04/17/2010   EF =50-55%; mild concentric LVH,LAE, AORTIC SCLEROSIS.TRACE TO MILD CENTRAL AI, TRACE MITRAL REGURGITATION, CANNOT ASSESS DIASTOLIC FUNCTIONDUE TO UNDERLYING RHYTHM,PACERMAKER WIRE IN THE RA/RV   DOPPLER ECHOCARDIOGRAPHY  04/21/2007   DONE IN [place]-- MILD MITRAL REGURG., MILD TRICUSPID [place]. WITH MILD PULM. HTN, MILD CONCENTRIC LEFT VENTRICULAR HYPERTROPHY,MILD LEFT ATRIAL ENLARGEMENT.   EYE SURGERY  1983   Cataract   HERNIA REPAIR Bilateral 2000   INGUINAL HERNIA REPAIR Right 12/13/2018   Procedure: HERNIA REPAIR INGUINAL ADULT WITH MESH;  Surgeon: Aviva Signs, MD;  Location: [facility];  Service: General;  Laterality: Right;   INSERT / REPLACE / REMOVE PACEMAKER  SEPT 2008   INSERT DUAL -CHAMBER-medtronic adapta ADDR01 serial   [id number]   [place] W/SPECT  04/17/2010   EF 50%; LOW NORMAL LV FUNCTION,   PACEMAKER INSERTION  2008   DUAL-CHAMBER   PPM GENERATOR CHANGEOUT N/A 12/29/2016   Procedure: PPM Generator Changeout;  Surgeon: Evans Lance, MD;  Location: [facility] [facility];  Service: Cardiovascular;  Laterality: N/A;    FAMILY HISTORY: Family History  Problem Relation Age of Onset   Peripheral vascular disease Mother    Hypertension Mother    Diabetes Mother    Heart disease Mother    Hyperlipidemia Mother    Stroke Father 40       TIA   Peripheral vascular disease Sister    Cancer Sister    Diabetes Sister    Heart disease Sister        Stent- Heart Disease before age 1   Hyperlipidemia Sister    Hypertension Sister    Peripheral vascular disease Brother    Diabetes Brother    Heart disease Brother        CHF and Heart Disease before age 60   Hyperlipidemia Brother    Hypertension Brother    Sleep apnea Neg Hx     SOCIAL HISTORY: Social History   Socioeconomic History   Marital status: Married    Spouse name: Not on file   Number of children: Not on file   Years of education: Not on file   Highest education level: Not on file   Occupational History   Not on file  Tobacco Use   Smoking status: Never   Smokeless tobacco: Never  Vaping Use   Vaping Use: Never used  Substance and Sexual Activity   Alcohol use: No    Alcohol/week: 0.0 standard drinks of alcohol   Drug use: No   Sexual activity: Not on file  Other Topics Concern   Not on file  Social History Narrative   Controls his diabetes through diet and exercise.   Social Determinants of  Health   Financial Resource Strain: Not on file  Food Insecurity: Not on file  Transportation Needs: Not on file  Physical Activity: Not on file  Stress: Not on file  Social Connections: Not on file  Intimate Partner Violence: Not on file      PHYSICAL EXAM Generalized: Well developed, in no acute distress   Neurological examination  Mentation: Alert oriented to time, place, history taking. Follows all commands speech and language fluent Cranial nerve II-XII:Extraocular movements were full. Facial symmetry noted.  Reflexes: UTA  DIAGNOSTIC DATA (LABS, IMAGING, TESTING) - I reviewed patient records, labs, notes, testing and imaging myself where available.  Lab Results  Component Value Date   WBC 4.8 04/19/2022   HGB 13.4 04/19/2022   HCT 40.0 04/19/2022   MCV 90.1 04/19/2022   PLT 157 04/19/2022      Component Value Date/Time   NA 140 04/19/2022 1013   K 3.3 (L) 04/19/2022 1013   CL 108 04/19/2022 1013   CO2 25 04/19/2022 1013   GLUCOSE 133 (H) 04/19/2022 1013   BUN 19 04/19/2022 1013   CREATININE 0.87 04/19/2022 1013   CREATININE 0.86 12/06/2017 1429   CALCIUM 8.5 (L) 04/19/2022 1013   PROT 5.9 (L) 12/06/2017 1429   ALBUMIN 4.4 07/03/2013 0914   AST 20 12/06/2017 1429   ALT 16 12/06/2017 1429   ALKPHOS 73 07/03/2013 0914   BILITOT 0.7 12/06/2017 1429   GFRNONAA >60 04/19/2022 1013   GFRAA >60 12/12/2018 1404   Lab Results  Component Value Date   CHOL 141 07/03/2013   HDL 45 07/03/2013   LDLCALC 81 07/03/2013   TRIG 77 07/03/2013    CHOLHDL 3.1 07/03/2013      ASSESSMENT AND PLAN 81 y.o. year old male  has a past medical history of AAA (abdominal aortic aneurysm) ([place]), Arrhythmia, Atrial fibrillation ([place]), Back pain, chronic, Coronary artery disease (1996 and 1997), Diabetes mellitus without complication ([place]), High cholesterol, Hypertension, Left shoulder pain (2014), and Sleep apnea (Sept. 2008). here with:  OSA on CPAP  CPAP compliance excellent Residual AHI is good Encouraged patient to continue using CPAP nightly and > 4 hours each night F/U in 1 year or sooner if needed  Ward Givens, MSN, NP-C 06/15/2022, 9:19 AM [facility] [address], [place], [place] [address] [phone]

## 2022-06-30 ENCOUNTER — Other Ambulatory Visit: Payer: Self-pay | Admitting: Cardiology

## 2022-06-30 NOTE — Telephone Encounter (Signed)
Request for warfarin refill:  Last INR was 1.8 on 06/01/22 Next INR due on 07/13/22 LOV was 05/17/22  Gerrianne Scale PA-C  Refill approved.

## 2022-07-13 ENCOUNTER — Ambulatory Visit: Payer: Medicare Other | Attending: Cardiology | Admitting: *Deleted

## 2022-07-13 DIAGNOSIS — Z5181 Encounter for therapeutic drug level monitoring: Secondary | ICD-10-CM | POA: Diagnosis not present

## 2022-07-13 DIAGNOSIS — I4891 Unspecified atrial fibrillation: Secondary | ICD-10-CM | POA: Diagnosis not present

## 2022-07-13 LAB — POCT INR: INR: 2.1 (ref 2.0–3.0)

## 2022-07-13 NOTE — Patient Instructions (Signed)
Continue warfarin 1 tablet daily  Recheck in 6 weeks.  

## 2022-07-14 ENCOUNTER — Ambulatory Visit (INDEPENDENT_AMBULATORY_CARE_PROVIDER_SITE_OTHER): Payer: Medicare Other

## 2022-07-14 DIAGNOSIS — Z950 Presence of cardiac pacemaker: Secondary | ICD-10-CM

## 2022-07-14 DIAGNOSIS — I495 Sick sinus syndrome: Secondary | ICD-10-CM | POA: Diagnosis not present

## 2022-07-14 LAB — CUP PACEART REMOTE DEVICE CHECK
Battery Remaining Longevity: 89 mo
Battery Voltage: 2.98 V
Brady Statistic AP VP Percent: 0.08 %
Brady Statistic AP VS Percent: 99.87 %
Brady Statistic AS VP Percent: 0 %
Brady Statistic AS VS Percent: 0.05 %
Brady Statistic RA Percent Paced: 99.88 %
Brady Statistic RV Percent Paced: 0.15 %
Date Time Interrogation Session: 20231122004256
Implantable Lead Connection Status: 753985
Implantable Lead Connection Status: 753985
Implantable Lead Implant Date: 20080902
Implantable Lead Implant Date: 20080902
Implantable Lead Location: 753859
Implantable Lead Location: 753860
Implantable Lead Model: 5076
Implantable Lead Model: 5076
Implantable Pulse Generator Implant Date: 20180509
Lead Channel Impedance Value: 380 Ohm
Lead Channel Impedance Value: 380 Ohm
Lead Channel Impedance Value: 456 Ohm
Lead Channel Impedance Value: 532 Ohm
Lead Channel Pacing Threshold Amplitude: 0.75 V
Lead Channel Pacing Threshold Amplitude: 1 V
Lead Channel Pacing Threshold Pulse Width: 0.4 ms
Lead Channel Pacing Threshold Pulse Width: 0.4 ms
Lead Channel Sensing Intrinsic Amplitude: 0.875 mV
Lead Channel Sensing Intrinsic Amplitude: 0.875 mV
Lead Channel Sensing Intrinsic Amplitude: 23.375 mV
Lead Channel Sensing Intrinsic Amplitude: 23.375 mV
Lead Channel Setting Pacing Amplitude: 2 V
Lead Channel Setting Pacing Amplitude: 2.5 V
Lead Channel Setting Pacing Pulse Width: 0.4 ms
Lead Channel Setting Sensing Sensitivity: 0.9 mV
Zone Setting Status: 755011
Zone Setting Status: 755011

## 2022-08-05 NOTE — Progress Notes (Signed)
Remote pacemaker transmission.   

## 2022-08-25 ENCOUNTER — Ambulatory Visit: Payer: Medicare Other | Attending: Cardiology | Admitting: *Deleted

## 2022-08-25 DIAGNOSIS — Z5181 Encounter for therapeutic drug level monitoring: Secondary | ICD-10-CM

## 2022-08-25 DIAGNOSIS — I4891 Unspecified atrial fibrillation: Secondary | ICD-10-CM

## 2022-08-25 LAB — POCT INR: INR: 2.6 (ref 2.0–3.0)

## 2022-08-25 NOTE — Patient Instructions (Signed)
Continue warfarin 1 tablet daily  Recheck in 6 weeks.  

## 2022-10-04 ENCOUNTER — Ambulatory Visit: Payer: Medicare Other | Attending: Cardiology | Admitting: *Deleted

## 2022-10-04 DIAGNOSIS — I4891 Unspecified atrial fibrillation: Secondary | ICD-10-CM

## 2022-10-04 DIAGNOSIS — Z5181 Encounter for therapeutic drug level monitoring: Secondary | ICD-10-CM | POA: Diagnosis not present

## 2022-10-04 LAB — POCT INR: INR: 3.1 — AB (ref 2.0–3.0)

## 2022-10-04 NOTE — Patient Instructions (Signed)
Continue warfarin 1 tablet daily  Recheck in 6 weeks.

## 2022-10-13 ENCOUNTER — Ambulatory Visit (INDEPENDENT_AMBULATORY_CARE_PROVIDER_SITE_OTHER): Payer: Medicare Other

## 2022-10-13 ENCOUNTER — Other Ambulatory Visit: Payer: Self-pay | Admitting: Physician Assistant

## 2022-10-13 DIAGNOSIS — I495 Sick sinus syndrome: Secondary | ICD-10-CM | POA: Diagnosis not present

## 2022-10-13 LAB — CUP PACEART REMOTE DEVICE CHECK
Battery Remaining Longevity: 86 mo
Battery Voltage: 2.98 V
Brady Statistic AP VP Percent: 0.08 %
Brady Statistic AP VS Percent: 99.8 %
Brady Statistic AS VP Percent: 0 %
Brady Statistic AS VS Percent: 0.12 %
Brady Statistic RA Percent Paced: 99.87 %
Brady Statistic RV Percent Paced: 0.08 %
Date Time Interrogation Session: 20240221004423
Implantable Lead Connection Status: 753985
Implantable Lead Connection Status: 753985
Implantable Lead Implant Date: 20080902
Implantable Lead Implant Date: 20080902
Implantable Lead Location: 753859
Implantable Lead Location: 753860
Implantable Lead Model: 5076
Implantable Lead Model: 5076
Implantable Pulse Generator Implant Date: 20180509
Lead Channel Impedance Value: 361 Ohm
Lead Channel Impedance Value: 380 Ohm
Lead Channel Impedance Value: 456 Ohm
Lead Channel Impedance Value: 513 Ohm
Lead Channel Pacing Threshold Amplitude: 0.75 V
Lead Channel Pacing Threshold Amplitude: 0.875 V
Lead Channel Pacing Threshold Pulse Width: 0.4 ms
Lead Channel Pacing Threshold Pulse Width: 0.4 ms
Lead Channel Sensing Intrinsic Amplitude: 1 mV
Lead Channel Sensing Intrinsic Amplitude: 1 mV
Lead Channel Sensing Intrinsic Amplitude: 23.25 mV
Lead Channel Sensing Intrinsic Amplitude: 23.25 mV
Lead Channel Setting Pacing Amplitude: 2 V
Lead Channel Setting Pacing Amplitude: 2.5 V
Lead Channel Setting Pacing Pulse Width: 0.4 ms
Lead Channel Setting Sensing Sensitivity: 0.9 mV
Zone Setting Status: 755011
Zone Setting Status: 755011

## 2022-10-26 ENCOUNTER — Ambulatory Visit: Payer: Medicare Other | Attending: Cardiology | Admitting: Cardiology

## 2022-10-26 ENCOUNTER — Encounter: Payer: Self-pay | Admitting: Cardiology

## 2022-10-26 VITALS — BP 162/76 | HR 76 | Wt 153.0 lb

## 2022-10-26 DIAGNOSIS — Z950 Presence of cardiac pacemaker: Secondary | ICD-10-CM

## 2022-10-26 DIAGNOSIS — I10 Essential (primary) hypertension: Secondary | ICD-10-CM

## 2022-10-26 DIAGNOSIS — I2510 Atherosclerotic heart disease of native coronary artery without angina pectoris: Secondary | ICD-10-CM | POA: Diagnosis not present

## 2022-10-26 DIAGNOSIS — I4891 Unspecified atrial fibrillation: Secondary | ICD-10-CM

## 2022-10-26 DIAGNOSIS — D6869 Other thrombophilia: Secondary | ICD-10-CM

## 2022-10-26 DIAGNOSIS — E782 Mixed hyperlipidemia: Secondary | ICD-10-CM

## 2022-10-26 NOTE — Patient Instructions (Signed)
Medication Instructions:  Your physician recommends that you continue on your current medications as directed. Please refer to the Current Medication list given to you today.   Labwork: None  Testing/Procedures: None  Follow-Up: Follow up with Dr. Branch in 6 months.   Any Other Special Instructions Will Be Listed Below (If Applicable).     If you need a refill on your cardiac medications before your next appointment, please call your pharmacy.  

## 2022-10-26 NOTE — Progress Notes (Signed)
Clinical Summary Mr. Pu is a 82 y.o.male seen today for follow up of the following medical problems     1. AAA   - followed by vascular   - 2021 AAA ectatic, stable 3 x 3 cm.    2. HTN   - He has had some white coat HTN in the past, typically elevated in clinic with normal home numbers   - home bp 120s/70s and stable   3. Parox Afib   -on sotalol 160 mg bid, on approx since 2010.  - Has not been interested in DOACs, has elected to stay on coumadin.   - no palpitaitons - no bleeding on coumadin.       4. Tachy-brady syndrome: dual chamber permanent pacemaker   - followed by EP  09/2022 normal device check - no symptoms   5. CAD   -remote history per old clinic notes, prior angioplasty in 1996 and 1997 at [facility]    - denies any chest pains.    6. Hyperlipidemia   - reports recent labs with pcp - 12/2016 TC 145 TG 41 HDL 61 LDL 76 08/2021 TC 151 TG 48 HDL 63 LDL 78  - intolerant to statins, had decliend lipid clinic referral - he stopped his statin, reportes muscle aches  06/2022 TC 207 TG 60 HDL 62 LDL 134   7. OSA   - followed by [facility], compliant with CPAP         SH: [employer] courses. 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    2 grand daugthers just graduated from high school. Both are heading to college, one to [employer] and the other to a local school in [place].   Past Medical History:  Diagnosis Date   AAA (abdominal aortic aneurysm) ([place])    Arrhythmia    Atrial fibrillation (HCC)    Back pain, chronic    Coronary artery disease 1996 and 1997   angioplasty of LAD   Diabetes mellitus without complication ([place])    MILD TYPE 2   High cholesterol    Hypertension    Left shoulder pain 2014   Sleep apnea Sept. 2008   USES C-PAP     Allergies  Allergen Reactions   Fexofenadine Hcl Palpitations    Allegra     Current Outpatient Medications   Medication Sig Dispense Refill   Ascorbic Acid (VITAMIN C) 1000 MG tablet 500 mg.      Cholecalciferol (VITAMIN D3) 50 MCG (2000 UT) CAPS Take 2,000 Units by mouth daily.     diltiazem (CARDIZEM CD) 240 MG 24 hr capsule Take 240 mg by mouth at bedtime.      EASYMAX TEST test strip USE TO TEST BLOOD SUGARATWICE DAILY.     Lancets (STERILANCE TL) MISC USE TO TEST BLOOG SUGARWTWICE DAILY.     levothyroxine (SYNTHROID) 88 MCG tablet Take 88 mcg by mouth daily before breakfast.      losartan (COZAAR) 50 MG tablet Take 1.5 tablets (75 mg total) by mouth daily. 135 tablet 1   RED YEAST RICE EXTRACT PO Take by mouth. (Patient not taking: Reported on 05/17/2022)     saw palmetto 500 MG capsule Take 500 mg by mouth 2 (two) times daily.      sotalol (BETAPACE) 160 MG tablet TAKE (1) TABLET BY MOUTH TWICE DAILY. 180 tablet 3   warfarin (COUMADIN) 5 MG tablet TAKE (1) TABLET  BY MOUTH DAILY AS DIRECTED. 100 tablet 3   zinc gluconate 50 MG tablet 50 mg daily.     No current facility-administered medications for this visit.     Past Surgical History:  Procedure Laterality Date   [place]  06/18/1996   mid-circ tandem overlying stents with 20%,70%mid-LAD naroowin stent site in circ with 70% stenosis in the mid to distal marginal Jamariya Davidoff,tandem 95% and 80% stenosis in the proximal portion of the posterior descending Dangelo Guzzetta of RCA    CATARACT EXTRACTION  1983   COLONOSCOPY N/A 09/04/2014   Procedure: COLONOSCOPY;  Surgeon: [name], MD;  [facility];  Service: Endoscopy;  Laterality: N/A;  1030   CORONARY ANGIOPLASTY WITH STENT PLACEMENT  08/18/1995   PTA/STENT CIRC   CORONARY ANGIOPLASTY WITH STENT PLACEMENT  06/18/1996   PTCA  posterior descending  Tamsen Reist of the RCA 95 AND 80% TO LESSTHAN 20%   CORONARY STENT PLACEMENT  1996   DOPPLER ECHOCARDIOGRAPHY  04/17/2010   EF =50-55%; mild concentric LVH,LAE, AORTIC SCLEROSIS.TRACE TO MILD  CENTRAL AI, TRACE MITRAL REGURGITATION, CANNOT ASSESS DIASTOLIC FUNCTIONDUE TO UNDERLYING RHYTHM,PACERMAKER WIRE IN THE RA/RV   DOPPLER ECHOCARDIOGRAPHY  04/21/2007   DONE IN [place]-- MILD MITRAL REGURG., MILD TRICUSPID [place]. WITH MILD PULM. HTN, MILD CONCENTRIC LEFT VENTRICULAR HYPERTROPHY,MILD LEFT ATRIAL ENLARGEMENT.   EYE SURGERY  1983   Cataract   HERNIA REPAIR Bilateral 2000   INGUINAL HERNIA REPAIR Right 12/13/2018   Procedure: HERNIA REPAIR INGUINAL ADULT WITH MESH;  Surgeon: Aviva Signs, MD;  Location: [facility];  Service: General;  Laterality: Right;   INSERT / REPLACE / REMOVE PACEMAKER  SEPT 2008   INSERT DUAL -CHAMBER-medtronic adapta ADDR01 serial   [id number]   [place] W/SPECT  04/17/2010   EF 50%; LOW NORMAL LV FUNCTION,   PACEMAKER INSERTION  2008   DUAL-CHAMBER   PPM GENERATOR CHANGEOUT N/A 12/29/2016   Procedure: PPM Generator Changeout;  Surgeon: Evans Lance, MD;  Location: [place];  Service: Cardiovascular;  Laterality: N/A;     Allergies  Allergen Reactions   Fexofenadine Hcl Palpitations    Allegra      Family History  Problem Relation Age of Onset   Peripheral vascular disease Mother    Hypertension Mother    Diabetes Mother    Heart disease Mother    Hyperlipidemia Mother    Stroke Father 63       TIA   Peripheral vascular disease Sister    Cancer Sister    Diabetes Sister    Heart disease Sister        Stent- Heart Disease before age 63   Hyperlipidemia Sister    Hypertension Sister    Peripheral vascular disease Brother    Diabetes Brother    Heart disease Brother        CHF and Heart Disease before age 74   Hyperlipidemia Brother    Hypertension Brother    Sleep apnea Neg Hx      Social History Mr. Surman reports that he has never smoked. He has never used smokeless tobacco. Mr. Mcguane reports no history of alcohol use.   Review of Systems CONSTITUTIONAL: No weight loss, fever, chills, weakness or fatigue.   HEENT: Eyes: No visual loss, blurred vision, double vision or yellow sclerae.No hearing loss, sneezing, congestion, runny nose or sore throat.  SKIN: No rash or itching.  CARDIOVASCULAR: per hpi RESPIRATORY: No shortness  of breath, cough or sputum.  GASTROINTESTINAL: No anorexia, nausea, vomiting or diarrhea. No abdominal pain or blood.  GENITOURINARY: No burning on urination, no polyuria NEUROLOGICAL: No headache, dizziness, syncope, paralysis, ataxia, numbness or tingling in the extremities. No change in bowel or bladder control.  MUSCULOSKELETAL: No muscle, back pain, joint pain or stiffness.  LYMPHATICS: No enlarged nodes. No history of splenectomy.  PSYCHIATRIC: No history of depression or anxiety.  ENDOCRINOLOGIC: No reports of sweating, cold or heat intolerance. No polyuria or polydipsia.  Marland Kitchen   Physical Examination Today's Vitals   10/26/22 1145  BP: (!) 162/76  Pulse: 76  SpO2: 97%  Weight: 153 lb (69.4 kg)   Body mass index is 24.69 kg/m.  Gen: resting comfortably, no acute distress HEENT: no scleral icterus, pupils equal round and reactive, no palptable cervical adenopathy,  CV: RRR, no m/rg, no jvd Resp: Clear to auscultation bilaterally GI: abdomen is soft, non-tender, non-distended, normal bowel sounds, no hepatosplenomegaly MSK: extremities are warm, no edema.  Skin: warm, no rash Neuro:  no focal deficits Psych: appropriate affect   Diagnostic Studies  Echo 03/2010 [facility]: LVEF 50-55%, mild LVH, LAE     03/2010 [facility]: no ischemia, LVEF 50%.      Assessment and Plan   1. AAA,   -mild and stable for several years, no longer monitoring   2. CAD   - no symptoms, continue to monitor   3. Parox afib /acquired thrombophilia - he is not in favor of DOACs, remain on coumadin - no symptoms, continue current meds   4. Tachy-brady syndrome/Pacemaker - no symptoms,recent normal device check - continue to monitor   5. Hyperlipidemia -did not tolerate  statins. Had turned down lipid clinic referral last visit, upcoming labs with pcp. Pending results discuss again [id number]   6. HTN - elevated in clinic consistently but home numbers always at goal which is pattern today as well - continue current meds   F/u 6 months       Arnoldo Lenis, M.D.,

## 2022-11-02 ENCOUNTER — Ambulatory Visit: Payer: Medicare Other | Attending: Internal Medicine | Admitting: Internal Medicine

## 2022-11-02 ENCOUNTER — Encounter: Payer: Self-pay | Admitting: Internal Medicine

## 2022-11-02 VITALS — BP 152/78 | HR 78 | Ht 65.5 in | Wt 151.8 lb

## 2022-11-02 DIAGNOSIS — I480 Paroxysmal atrial fibrillation: Secondary | ICD-10-CM | POA: Diagnosis not present

## 2022-11-02 DIAGNOSIS — I495 Sick sinus syndrome: Secondary | ICD-10-CM | POA: Diagnosis not present

## 2022-11-02 LAB — CUP PACEART INCLINIC DEVICE CHECK
Battery Remaining Longevity: 85 mo
Battery Voltage: 2.98 V
Brady Statistic AP VP Percent: 0.07 %
Brady Statistic AP VS Percent: 99.83 %
Brady Statistic AS VP Percent: 0 %
Brady Statistic AS VS Percent: 0.09 %
Brady Statistic RA Percent Paced: 99.89 %
Brady Statistic RV Percent Paced: 0.09 %
Date Time Interrogation Session: 20240312094636
Implantable Lead Connection Status: 753985
Implantable Lead Connection Status: 753985
Implantable Lead Implant Date: 20080902
Implantable Lead Implant Date: 20080902
Implantable Lead Location: 753859
Implantable Lead Location: 753860
Implantable Lead Model: 5076
Implantable Lead Model: 5076
Implantable Pulse Generator Implant Date: 20180509
Lead Channel Impedance Value: 361 Ohm
Lead Channel Impedance Value: 418 Ohm
Lead Channel Impedance Value: 494 Ohm
Lead Channel Impedance Value: 551 Ohm
Lead Channel Pacing Threshold Amplitude: 0.75 V
Lead Channel Pacing Threshold Amplitude: 1 V
Lead Channel Pacing Threshold Pulse Width: 0.4 ms
Lead Channel Pacing Threshold Pulse Width: 0.4 ms
Lead Channel Sensing Intrinsic Amplitude: 0.75 mV
Lead Channel Sensing Intrinsic Amplitude: 0.75 mV
Lead Channel Sensing Intrinsic Amplitude: 22 mV
Lead Channel Sensing Intrinsic Amplitude: 29.625 mV
Lead Channel Setting Pacing Amplitude: 2 V
Lead Channel Setting Pacing Amplitude: 2.5 V
Lead Channel Setting Pacing Pulse Width: 0.4 ms
Lead Channel Setting Sensing Sensitivity: 0.9 mV
Zone Setting Status: 755011
Zone Setting Status: 755011

## 2022-11-02 NOTE — Patient Instructions (Signed)
Medication Instructions:  Your physician recommends that you continue on your current medications as directed. Please refer to the Current Medication list given to you today.  *If you need a refill on your cardiac medications before your next appointment, please call your pharmacy*   Lab Work: NONE   If you have labs (blood work) drawn today and your tests are completely normal, you will receive your results only by: MyChart Message (if you have MyChart) OR A paper copy in the mail If you have any lab test that is abnormal or we need to change your treatment, we will call you to review the results.   Testing/Procedures: NONE    Follow-Up: At [facility], you and your health needs are our priority.  As part of our continuing mission to provide you with exceptional heart care, we have created designated Provider Care Teams.  These Care Teams include your primary Cardiologist (physician) and Advanced Practice Providers (APPs -  Physician Assistants and Nurse Practitioners) who all work together to provide you with the care you need, when you need it.  We recommend signing up for the patient portal called "MyChart".  Sign up information is provided on this After Visit Summary.  MyChart is used to connect with patients for Virtual Visits (Telemedicine).  Patients are able to view lab/test results, encounter notes, upcoming appointments, etc.  Non-urgent messages can be sent to your provider as well.   To learn more about what you can do with MyChart, go to [email or web address].    Your next appointment:   1 year(s)  Provider:   Gregg Taylor, MD    Other Instructions Thank you for choosing [facility]!    

## 2022-11-02 NOTE — Progress Notes (Signed)
HPI [name] returns today for followup. He is a pleasant 82 yo man with a h/o sinus node dysfunction, PAF, s/p PPM insertion.  He feels well and his activity has improved. He denies chest pain or sob or edema. He remains active exercising daily. He plays in a band. He denies palpitations. He is tolerating warfarin. Allergies  Allergen Reactions   Fexofenadine Hcl Palpitations    Allegra   Rosuvastatin      Current Outpatient Medications  Medication Sig Dispense Refill   Ascorbic Acid (VITAMIN C) 1000 MG tablet 500 mg.      Cholecalciferol (VITAMIN D3) 50 MCG (2000 UT) CAPS Take 2,000 Units by mouth daily.     diltiazem (CARDIZEM CD) 240 MG 24 hr capsule Take 240 mg by mouth at bedtime.      levothyroxine (SYNTHROID) 88 MCG tablet Take 88 mcg by mouth daily before breakfast.      losartan (COZAAR) 50 MG tablet Take 1.5 tablets (75 mg total) by mouth daily. 135 tablet 1   saw palmetto 500 MG capsule Take 500 mg by mouth 2 (two) times daily.      sotalol (BETAPACE) 160 MG tablet TAKE (1) TABLET BY MOUTH TWICE DAILY. 180 tablet 3   warfarin (COUMADIN) 5 MG tablet TAKE (1) TABLET BY MOUTH DAILY AS DIRECTED. 100 tablet 3   zinc gluconate 50 MG tablet 50 mg daily.     No current facility-administered medications for this visit.     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 2008   USES C-PAP    ROS:   All systems reviewed and negative except as noted in the HPI.   Past Surgical History:  Procedure Laterality Date   [place]  06/18/1996   mid-circ tandem overlying stents with 20%,70%mid-LAD naroowin stent site in  circ with 70% stenosis in the mid to distal marginal branch,tandem 95% and 80% stenosis in the proximal portion of the posterior descending branch of RCA    CATARACT EXTRACTION  1983   COLONOSCOPY N/A 09/04/2014   Procedure: COLONOSCOPY;  Surgeon: [name], MD;  Location[facility];  Service: Endoscopy;  Laterality: N/A;  1030   CORONARY ANGIOPLASTY WITH STENT PLACEMENT  08/18/1995   PTA/STENT CIRC   CORONARY ANGIOPLASTY WITH STENT PLACEMENT  06/18/1996   PTCA  posterior descending  branch of the RCA 95 AND 80% TO LESSTHAN 20%   CORONARY STENT PLACEMENT  1996   DOPPLER ECHOCARDIOGRAPHY  04/17/2010   EF =50-55%; mild concentric LVH,LAE, AORTIC SCLEROSIS.TRACE TO MILD CENTRAL AI, TRACE MITRAL REGURGITATION, CANNOT ASSESS DIASTOLIC FUNCTIONDUE TO UNDERLYING RHYTHM,PACERMAKER WIRE IN THE RA/RV   DOPPLER ECHOCARDIOGRAPHY  04/21/2007   DONE IN NEW BERN-- MILD MITRAL REGURG., MILD TRICUSPID [place]. WITH MILD PULM. HTN, MILD CONCENTRIC LEFT VENTRICULAR HYPERTROPHY,MILD LEFT ATRIAL ENLARGEMENT.   EYE SURGERY  1983   Cataract   HERNIA REPAIR Bilateral 2000   INGUINAL HERNIA REPAIR Right 12/13/2018   Procedure: HERNIA REPAIR INGUINAL ADULT WITH MESH;  Surgeon: Aviva Signs, MD;  Location: [facility]  ORS;  Service: General;  Laterality: Right;   INSERT / REPLACE / REMOVE PACEMAKER  SEPT 2008   INSERT DUAL -CHAMBER-medtronic adapta ADDR01 serial   [id number]   NM MYOCAR MULTIPLE W/SPECT  04/17/2010   EF 50%; LOW NORMAL LV FUNCTION,   PACEMAKER INSERTION  2008   DUAL-CHAMBER   PPM GENERATOR CHANGEOUT N/A 12/29/2016   Procedure: PPM Generator Changeout;  Surgeon: Evans Lance, MD;  Location: [place] [facility];  Service: Cardiovascular;  Laterality: N/A;     Family History  Problem Relation Age of Onset   Peripheral vascular disease Mother    Hypertension Mother    Diabetes Mother    Heart disease Mother    Hyperlipidemia Mother    Stroke Father 20       TIA   Peripheral vascular disease  Sister    Cancer Sister    Diabetes Sister    Heart disease Sister        Stent- Heart Disease before age 1   Hyperlipidemia Sister    Hypertension Sister    Peripheral vascular disease Brother    Diabetes Brother    Heart disease Brother        CHF and Heart Disease before age 17   Hyperlipidemia Brother    Hypertension Brother    Sleep apnea Neg Hx      Social History   Socioeconomic History   Marital status: Married    Spouse name: Not on file   Number of children: Not on file   Years of education: Not on file   Highest education level: Not on file  Occupational History   Not on file  Tobacco Use   Smoking status: Never   Smokeless tobacco: Never  Vaping Use   Vaping Use: Never used  Substance and Sexual Activity   Alcohol use: No    Alcohol/week: 0.0 standard drinks of alcohol   Drug use: No   Sexual activity: Not on file  Other Topics Concern   Not on file  Social History Narrative   Controls his diabetes through diet and exercise.   Social Determinants of Health   Financial Resource Strain: Not on file  Food Insecurity: Not on file  Transportation Needs: Not on file  Physical Activity: Not on file  Stress: Not on file  Social Connections: Not on file  Intimate Partner Violence: Not on file     BP (!) 152/78   Pulse 78   Ht 5' 5.5" (1.664 m)   Wt 151 lb 12.8 oz (68.9 kg)   SpO2 99%   BMI 24.88 kg/m   Physical Exam:  Well appearing NAD HEENT: Unremarkable Neck:  No JVD, no thyromegally Lymphatics:  No adenopathy Back:  No CVA tenderness Lungs:  Clear with no wheezes HEART:  Regular rate rhythm, no murmurs, no rubs, no clicks Abd:  soft, positive bowel sounds, no organomegally, no rebound, no guarding Ext:  2 plus pulses, no edema, no cyanosis, no clubbing Skin:  No rashes no nodules Neuro:  CN II through XII intact, motor grossly intact  DEVICE  Normal device function.  See PaceArt for details.   Assess/Plan: 1. PAF - he is  maintaining NSR over 99.9% of the time. 2. HTN - his bp is well controlled. 3. Sinus node dysfunction - he is atrial pacing over 99.5% of the time and is asymptomatic.  4. 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   Johnny Reynolds Johnny Lohmeyer,MD

## 2022-11-10 ENCOUNTER — Other Ambulatory Visit: Payer: Self-pay | Admitting: Internal Medicine

## 2022-11-10 NOTE — Progress Notes (Signed)
Remote pacemaker transmission.   

## 2022-11-16 ENCOUNTER — Ambulatory Visit: Payer: Medicare Other | Attending: Cardiology | Admitting: *Deleted

## 2022-11-16 DIAGNOSIS — I4891 Unspecified atrial fibrillation: Secondary | ICD-10-CM | POA: Diagnosis not present

## 2022-11-16 DIAGNOSIS — Z5181 Encounter for therapeutic drug level monitoring: Secondary | ICD-10-CM | POA: Diagnosis not present

## 2022-11-16 LAB — POCT INR: INR: 3.3 — AB (ref 2.0–3.0)

## 2022-11-16 NOTE — Patient Instructions (Signed)
Take warfarin 1/2 tablet tonight then resume 1 tablet daily  Increase greens a little Recheck in 6 weeks.

## 2022-11-18 ENCOUNTER — Encounter: Payer: Self-pay | Admitting: Cardiology

## 2022-11-19 ENCOUNTER — Other Ambulatory Visit: Payer: Self-pay

## 2022-11-19 MED ORDER — ATORVASTATIN CALCIUM 80 MG PO TABS: 80.0000 mg | ORAL_TABLET | Freq: Every day | ORAL | 3 refills | Status: DC

## 2022-12-07 ENCOUNTER — Ambulatory Visit

## 2022-12-23 ENCOUNTER — Encounter: Payer: Self-pay | Admitting: Cardiology

## 2022-12-23 NOTE — Telephone Encounter (Signed)
For single tooth extractions do not have to alter blood thinner regimen. Would clarify that his dentist is aware and comfortable, but guidelines would say that does not require holding blood thinner for this  Dominga Ferry MD

## 2022-12-28 ENCOUNTER — Ambulatory Visit: Payer: Medicare Other | Attending: Cardiology | Admitting: *Deleted

## 2022-12-28 DIAGNOSIS — Z5181 Encounter for therapeutic drug level monitoring: Secondary | ICD-10-CM | POA: Diagnosis not present

## 2022-12-28 DIAGNOSIS — I4891 Unspecified atrial fibrillation: Secondary | ICD-10-CM

## 2022-12-28 LAB — POCT INR: INR: 3.1 — AB (ref 2.0–3.0)

## 2022-12-28 NOTE — Patient Instructions (Signed)
Continue warfarin 1 tablet daily  Increase greens a little Recheck in 6 weeks.

## 2023-01-12 ENCOUNTER — Ambulatory Visit (INDEPENDENT_AMBULATORY_CARE_PROVIDER_SITE_OTHER): Payer: Medicare Other

## 2023-01-12 DIAGNOSIS — I495 Sick sinus syndrome: Secondary | ICD-10-CM

## 2023-01-12 LAB — CUP PACEART REMOTE DEVICE CHECK
Battery Remaining Longevity: 79 mo
Battery Voltage: 2.97 V
Brady Statistic AP VP Percent: 0.03 %
Brady Statistic AP VS Percent: 99.93 %
Brady Statistic AS VP Percent: 0 %
Brady Statistic AS VS Percent: 0.04 %
Brady Statistic RA Percent Paced: 99.96 %
Brady Statistic RV Percent Paced: 0.03 %
Date Time Interrogation Session: 20240522014108
Implantable Lead Connection Status: 753985
Implantable Lead Connection Status: 753985
Implantable Lead Implant Date: 20080902
Implantable Lead Implant Date: 20080902
Implantable Lead Location: 753859
Implantable Lead Location: 753860
Implantable Lead Model: 5076
Implantable Lead Model: 5076
Implantable Pulse Generator Implant Date: 20180509
Lead Channel Impedance Value: 361 Ohm
Lead Channel Impedance Value: 361 Ohm
Lead Channel Impedance Value: 456 Ohm
Lead Channel Impedance Value: 494 Ohm
Lead Channel Pacing Threshold Amplitude: 0.75 V
Lead Channel Pacing Threshold Amplitude: 1 V
Lead Channel Pacing Threshold Pulse Width: 0.4 ms
Lead Channel Pacing Threshold Pulse Width: 0.4 ms
Lead Channel Sensing Intrinsic Amplitude: 0.875 mV
Lead Channel Sensing Intrinsic Amplitude: 0.875 mV
Lead Channel Sensing Intrinsic Amplitude: 22 mV
Lead Channel Sensing Intrinsic Amplitude: 22 mV
Lead Channel Setting Pacing Amplitude: 2 V
Lead Channel Setting Pacing Amplitude: 2.5 V
Lead Channel Setting Pacing Pulse Width: 0.4 ms
Lead Channel Setting Sensing Sensitivity: 0.9 mV
Zone Setting Status: 755011
Zone Setting Status: 755011

## 2023-02-02 NOTE — Progress Notes (Signed)
Remote pacemaker transmission.   

## 2023-02-08 ENCOUNTER — Ambulatory Visit: Payer: Medicare Other | Attending: Cardiology | Admitting: *Deleted

## 2023-02-08 DIAGNOSIS — I4891 Unspecified atrial fibrillation: Secondary | ICD-10-CM | POA: Diagnosis not present

## 2023-02-08 DIAGNOSIS — Z5181 Encounter for therapeutic drug level monitoring: Secondary | ICD-10-CM

## 2023-02-08 LAB — POCT INR: INR: 2.6 (ref 2.0–3.0)

## 2023-02-08 NOTE — Patient Instructions (Signed)
Continue warfarin 1 tablet daily  Continue greens Recheck in 6 weeks 

## 2023-02-28 ENCOUNTER — Other Ambulatory Visit: Payer: Self-pay | Admitting: *Deleted

## 2023-02-28 DIAGNOSIS — I714 Abdominal aortic aneurysm, without rupture, unspecified: Secondary | ICD-10-CM

## 2023-03-15 ENCOUNTER — Ambulatory Visit: Payer: Medicare Other | Admitting: Physician Assistant

## 2023-03-15 ENCOUNTER — Ambulatory Visit (HOSPITAL_COMMUNITY)
Admission: RE | Admit: 2023-03-15 | Discharge: 2023-03-15 | Disposition: A | Discharge status: Home / Self Care | Payer: Medicare Other | Source: Ambulatory Visit | Attending: Vascular Surgery | Admitting: Vascular Surgery

## 2023-03-15 VITALS — BP 160/79 | HR 84 | Temp 98.4°F | Resp 18 | Ht 65.0 in | Wt 158.3 lb

## 2023-03-15 DIAGNOSIS — I714 Abdominal aortic aneurysm, without rupture, unspecified: Secondary | ICD-10-CM

## 2023-03-15 NOTE — Progress Notes (Signed)
Office Note     CC:  follow up Requesting Provider:  Assunta Found, MD  HPI: Johnny Reynolds is a 82 y.o. ([birth year]) male who presents for surveillance follow up of AAA.  He was last seen in July of 2021 at which time his AAA measured 3 cm. He has not had any associated symptoms.   The pt returns today for follow up with duplex.  He states he has been doing well overall.  No changes in his medical history since he was seen last. He does not get any pain in his legs while walking or at rest. No non healing wounds. He has not had any back or abdominal pain. He says he exercises 3x/week at the gym on the treadmill for 30 minutes at a 2% incline and also weight lifting. He says he has been doing this since 1997. 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    The pt is on a statin for cholesterol management.    The pt is not on an aspirin.    Other AC:  Coumadin for afib The pt is on CCB, ARB for hypertension.  The pt does have diabetes. Tobacco hx:  never Past Medical History:  Diagnosis Date   AAA (abdominal aortic aneurysm) (HCC)    Arrhythmia    Atrial fibrillation (HCC)    Back pain, chronic    Coronary artery disease 1996 and 1997   angioplasty of LAD   Diabetes mellitus without complication (HCC)    MILD TYPE 2   High cholesterol    Hypertension    Left shoulder pain 2014   Sleep apnea Sept. 2008   USES C-PAP    Past Surgical History:  Procedure Laterality Date   CARDIAC CATHETERIZATION  1996   CARDIAC CATHETERIZATION  06/18/1996   mid-circ tandem overlying stents with 20%,70%mid-LAD naroowin stent site in circ with 70% stenosis in the mid to distal marginal branch,tandem 95% and 80% stenosis in the proximal portion of the posterior descending branch of RCA    CATARACT EXTRACTION  1983   COLONOSCOPY N/A 09/04/2014   Procedure: COLONOSCOPY;  Surgeon: Malissa Hippo, MD;  [facility];  Service: Endoscopy;  Laterality: N/A;  1030   CORONARY  ANGIOPLASTY WITH STENT PLACEMENT  08/18/1995   PTA/STENT CIRC   CORONARY ANGIOPLASTY WITH STENT PLACEMENT  06/18/1996   PTCA  posterior descending  branch of the RCA 95 AND 80% TO LESSTHAN 20%   CORONARY STENT PLACEMENT  1996   DOPPLER ECHOCARDIOGRAPHY  04/17/2010   EF =50-55%; mild concentric LVH,LAE, AORTIC SCLEROSIS.TRACE TO MILD CENTRAL AI, TRACE MITRAL REGURGITATION, CANNOT ASSESS DIASTOLIC FUNCTIONDUE TO UNDERLYING RHYTHM,PACERMAKER WIRE IN THE RA/RV   DOPPLER ECHOCARDIOGRAPHY  04/21/2007   DONE IN [place]-- MILD MITRAL REGURG., MILD TRICUSPID REGURG. WITH MILD PULM. HTN, MILD CONCENTRIC LEFT VENTRICULAR HYPERTROPHY,MILD LEFT ATRIAL ENLARGEMENT.   EYE SURGERY  1983   Cataract   HERNIA REPAIR Bilateral 2000   INGUINAL HERNIA REPAIR Right 12/13/2018   Procedure: HERNIA REPAIR INGUINAL ADULT WITH MESH;  Surgeon: Franky Macho, MD;  Location: [facility];  Service: General;  Laterality: Right;   INSERT / REPLACE / REMOVE PACEMAKER  SEPT 2008   INSERT DUAL -CHAMBER-medtronic adapta ADDR01 serial   [id number]   NM MYOCAR MULTIPLE W/SPECT  04/17/2010   EF 50%; LOW NORMAL LV FUNCTION,   PACEMAKER INSERTION  2008   DUAL-CHAMBER   PPM GENERATOR CHANGEOUT N/A 12/29/2016   Procedure: PPM Generator  Changeout;  Surgeon: Marinus Maw, MD;  Location: [facility] INVASIVE CV LAB;  Service: Cardiovascular;  Laterality: N/A;    Social History   Socioeconomic History   Marital status: Married    Spouse name: Not on file   Number of children: Not on file   Years of education: Not on file   Highest education level: Not on file  Occupational History   Not on file  Tobacco Use   Smoking status: Never   Smokeless tobacco: Never  Vaping Use   Vaping status: Never Used  Substance and Sexual Activity   Alcohol use: No    Alcohol/week: 0.0 standard drinks of alcohol   Drug use: No   Sexual activity: Not on file  Other Topics Concern   Not on file  Social History Narrative   Controls his diabetes through  diet and exercise.   Social Determinants of Health   Financial Resource Strain: Not on file  Food Insecurity: Not on file  Transportation Needs: Not on file  Physical Activity: Not on file  Stress: Not on file  Social Connections: Not on file  Intimate Partner Violence: Not on file    Family History  Problem Relation Age of Onset   Peripheral vascular disease Mother    Hypertension Mother    Diabetes Mother    Heart disease Mother    Hyperlipidemia Mother    Stroke Father 106       TIA   Peripheral vascular disease Sister    Cancer Sister    Diabetes Sister    Heart disease Sister        Stent- Heart Disease before age 22   Hyperlipidemia Sister    Hypertension Sister    Peripheral vascular disease Brother    Diabetes Brother    Heart disease Brother        CHF and Heart Disease before age 10   Hyperlipidemia Brother    Hypertension Brother    Sleep apnea Neg Hx     Current Outpatient Medications  Medication Sig Dispense Refill   atorvastatin (LIPITOR) 80 MG tablet Take 1 tablet (80 mg total) by mouth daily. 90 tablet 3   Ascorbic Acid (VITAMIN C) 1000 MG tablet 500 mg.      Cholecalciferol (VITAMIN D3) 50 MCG (2000 UT) CAPS Take 2,000 Units by mouth daily.     diltiazem (CARDIZEM CD) 240 MG 24 hr capsule Take 240 mg by mouth at bedtime.      levothyroxine (SYNTHROID) 88 MCG tablet Take 88 mcg by mouth daily before breakfast.      losartan (COZAAR) 50 MG tablet Take 1.5 tablets (75 mg total) by mouth daily. 135 tablet 1   saw palmetto 500 MG capsule Take 500 mg by mouth 2 (two) times daily.      sotalol (BETAPACE) 160 MG tablet TAKE (1) TABLET BY MOUTH TWICE DAILY. 180 tablet 3   warfarin (COUMADIN) 5 MG tablet TAKE (1) TABLET BY MOUTH DAILY AS DIRECTED. 100 tablet 3   zinc gluconate 50 MG tablet 50 mg daily.     No current facility-administered medications for this visit.    Allergies  Allergen Reactions   Fexofenadine Hcl Palpitations    Allegra    Rosuvastatin      REVIEW OF SYSTEMS:  [X]  denotes positive finding, [ ]  denotes negative finding Cardiac  Comments:  Chest pain or chest pressure:    Shortness of breath upon exertion:    Short of breath when lying  flat:    Irregular heart rhythm:        Vascular    Pain in calf, thigh, or hip brought on by ambulation:    Pain in feet at night that wakes you up from your sleep:     Blood clot in your veins:    Leg swelling:         Pulmonary    Oxygen at home:    Productive cough:     Wheezing:         Neurologic    Sudden weakness in arms or legs:     Sudden numbness in arms or legs:     Sudden onset of difficulty speaking or slurred speech:    Temporary loss of vision in one eye:     Problems with dizziness:         Gastrointestinal    Blood in stool:     Vomited blood:         Genitourinary    Burning when urinating:     Blood in urine:        Psychiatric    Major depression:         Hematologic    Bleeding problems:    Problems with blood clotting too easily:        Skin    Rashes or ulcers:        Constitutional    Fever or chills:      PHYSICAL EXAMINATION:  Vitals:   03/15/23 0933  BP: (!) 160/79  Pulse: 84  Resp: 18  Temp: 98.4 F (36.9 C)  TempSrc: Temporal  SpO2: 95%  Weight: 158 lb 4.8 oz (71.8 kg)  Height: 5\' 5"  (1.651 m)    General:  WDWN in NAD; vital signs documented above Gait: Normal HENT: WNL, normocephalic Pulmonary: normal non-labored breathing , without wheezing Cardiac: regular HR Abdomen: soft, NT, no masses. Normal BS Vascular Exam/Pulses: 2+ femoral, popliteal and DP pulses bilaterally Extremities: without ischemic changes, without Gangrene , without cellulitis; without open wounds;  Musculoskeletal: no muscle wasting or atrophy  Neurologic: A&O X 3 Psychiatric:  The pt has Normal affect.   Non-Invasive Vascular Imaging:   Abdominal Aorta Findings:   +-----------+-------+----------+----------+--------+--------+--------+  Location  AP (cm)Trans (cm)PSV (cm/s)WaveformThrombusComments  +-----------+-------+----------+----------+--------+--------+--------+  Proximal  2.40   2.40      92                                  +-----------+-------+----------+----------+--------+--------+--------+  Mid       2.41   2.33      67                                  +-----------+-------+----------+----------+--------+--------+--------+  Distal    3.04   2.92      36                                  +-----------+-------+----------+----------+--------+--------+--------+  RT CIA Prox1.3    1.4       64        biphasic                  +-----------+-------+----------+----------+--------+--------+--------+  LT CIA Prox1.3    1.3       66  biphasic                  +-----------+-------+----------+----------+--------+--------+--------+   Summary:  Abdominal Aorta: There is evidence of abnormal dilatation of the distal Abdominal aorta. The largest aortic diameter remains essentially unchanged compared to prior exam. Previous diameter measurement was 3.0 cm obtained on 03/06/20.    ASSESSMENT/PLAN:: 82 y.o. male here for follow up for AAA. He is without any back or abdominal pain. He does not have any claudication, rest pain or tissue loss. - Duplex today is essentially unchanged from his duplex in 2021 with maximal diameter of 3 cm - Continue adequate blood pressure control - Encourage him to continue his exercise regimen - Discussed with him that should he develop severe, sudden onset of abdominal or back pain, he should proceed to ER or call 911 - He will follow up in 2-3 years with repeat AAA duplex   Graceann Congress, PA-C [facility] [phone]  Clinic MD: [place]

## 2023-03-22 ENCOUNTER — Ambulatory Visit: Payer: Medicare Other | Attending: Cardiology | Admitting: *Deleted

## 2023-03-22 DIAGNOSIS — Z5181 Encounter for therapeutic drug level monitoring: Secondary | ICD-10-CM

## 2023-03-22 DIAGNOSIS — I4891 Unspecified atrial fibrillation: Secondary | ICD-10-CM | POA: Diagnosis not present

## 2023-03-22 LAB — POCT INR: INR: 3.1 — AB (ref 2.0–3.0)

## 2023-03-22 NOTE — Patient Instructions (Signed)
Continue warfarin 1 tablet daily Continue greens Recheck in 6 weeks 

## 2023-04-13 ENCOUNTER — Ambulatory Visit: Payer: Medicare Other

## 2023-04-13 ENCOUNTER — Other Ambulatory Visit: Payer: Self-pay | Admitting: Physician Assistant

## 2023-04-13 ENCOUNTER — Other Ambulatory Visit: Payer: Self-pay | Admitting: Cardiology

## 2023-04-13 DIAGNOSIS — I495 Sick sinus syndrome: Secondary | ICD-10-CM | POA: Diagnosis not present

## 2023-04-13 LAB — CUP PACEART REMOTE DEVICE CHECK
Battery Remaining Longevity: 73 mo
Battery Voltage: 2.97 V
Brady Statistic AP VP Percent: 0.04 %
Brady Statistic AP VS Percent: 99.92 %
Brady Statistic AS VP Percent: 0 %
Brady Statistic AS VS Percent: 0.03 %
Brady Statistic RA Percent Paced: 99.97 %
Brady Statistic RV Percent Paced: 0.04 %
Date Time Interrogation Session: 20240821014426
Implantable Lead Connection Status: 753985
Implantable Lead Connection Status: 753985
Implantable Lead Implant Date: 20080902
Implantable Lead Implant Date: 20080902
Implantable Lead Location: 753859
Implantable Lead Location: 753860
Implantable Lead Model: 5076
Implantable Lead Model: 5076
Implantable Pulse Generator Implant Date: 20180509
Lead Channel Impedance Value: 361 Ohm
Lead Channel Impedance Value: 380 Ohm
Lead Channel Impedance Value: 456 Ohm
Lead Channel Impedance Value: 494 Ohm
Lead Channel Pacing Threshold Amplitude: 0.75 V
Lead Channel Pacing Threshold Amplitude: 1 V
Lead Channel Pacing Threshold Pulse Width: 0.4 ms
Lead Channel Pacing Threshold Pulse Width: 0.4 ms
Lead Channel Sensing Intrinsic Amplitude: 1 mV
Lead Channel Sensing Intrinsic Amplitude: 1 mV
Lead Channel Sensing Intrinsic Amplitude: 21.125 mV
Lead Channel Sensing Intrinsic Amplitude: 21.125 mV
Lead Channel Setting Pacing Amplitude: 2 V
Lead Channel Setting Pacing Amplitude: 2.5 V
Lead Channel Setting Pacing Pulse Width: 0.4 ms
Lead Channel Setting Sensing Sensitivity: 0.9 mV
Zone Setting Status: 755011
Zone Setting Status: 755011

## 2023-04-13 NOTE — Telephone Encounter (Signed)
Refill request for warfarin:  Last INR was 3.1 on 03/22/23 Next INR due 05/03/23 LOV was 10/26/22  Refill approved.

## 2023-04-27 NOTE — Progress Notes (Signed)
Remote pacemaker transmission.   

## 2023-05-03 ENCOUNTER — Ambulatory Visit: Payer: Medicare Other | Attending: Cardiology | Admitting: *Deleted

## 2023-05-03 DIAGNOSIS — Z5181 Encounter for therapeutic drug level monitoring: Secondary | ICD-10-CM | POA: Diagnosis not present

## 2023-05-03 DIAGNOSIS — I4891 Unspecified atrial fibrillation: Secondary | ICD-10-CM

## 2023-05-03 LAB — POCT INR: INR: 2.4 (ref 2.0–3.0)

## 2023-05-03 NOTE — Patient Instructions (Signed)
Continue warfarin 1 tablet daily Continue greens Recheck in 6 weeks 

## 2023-05-09 ENCOUNTER — Encounter: Payer: Self-pay | Admitting: Cardiology

## 2023-05-09 ENCOUNTER — Ambulatory Visit: Payer: Medicare Other | Attending: Cardiology | Admitting: Cardiology

## 2023-05-09 VITALS — BP 142/70 | HR 75 | Ht 66.0 in | Wt 160.2 lb

## 2023-05-09 DIAGNOSIS — I2510 Atherosclerotic heart disease of native coronary artery without angina pectoris: Secondary | ICD-10-CM | POA: Diagnosis not present

## 2023-05-09 DIAGNOSIS — Z950 Presence of cardiac pacemaker: Secondary | ICD-10-CM

## 2023-05-09 DIAGNOSIS — I4891 Unspecified atrial fibrillation: Secondary | ICD-10-CM | POA: Diagnosis not present

## 2023-05-09 DIAGNOSIS — I714 Abdominal aortic aneurysm, without rupture, unspecified: Secondary | ICD-10-CM

## 2023-05-09 DIAGNOSIS — I10 Essential (primary) hypertension: Secondary | ICD-10-CM

## 2023-05-09 DIAGNOSIS — E782 Mixed hyperlipidemia: Secondary | ICD-10-CM

## 2023-05-09 NOTE — Progress Notes (Signed)
Clinical Summary Johnny Reynolds is a 82 y.o.male seen today for follow up of the following medical problems     1. AAA   - followed by vascular   - 2021 AAA ectatic, stable 3 x 3 cm.  02/2023 AAA [place]: 3cm and table - per 02/2023 vascular note stable mild AAA, f/u 2-3 years     2. HTN   - He has had some white coat HTN in the past, typically elevated in clinic with normal home numbers     - home bp 140s/70s-80s. Reports high sodium intake.    3. Parox Afib   -on sotalol 160 mg bid, on approx since 2010.  - Has not been interested in DOACs, has elected to stay on coumadin.    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 CAD   -remote history per old clinic notes, prior angioplasty in 1996 and 1997 at [facility]    - no chest pains   6. Hyperlipidemia   - reports recent labs with pcp - 12/2016 TC 145 TG 41 HDL 61 LDL 76 08/2021 TC 151 TG 48 HDL 63 LDL 78   - intolerant to statins, had decliend lipid clinic referral - he stopped his statin, reportes muscle aches   06/2022 TC 207 TG 60 HDL 62 LDL 134 - he is on atorvastatin 80mg  daily, not clear when this was started. Tolearating without issues.    7. 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    2 grand daugthers just graduated from high school. Both are heading to college, one to [employer] and the other to a local school in [place].  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 2008   USES C-PAP     Allergies  Allergen Reactions   Fexofenadine Hcl Palpitations    Allegra   Rosuvastatin      Current Outpatient Medications  Medication Sig Dispense Refill   Ascorbic Acid (VITAMIN C) 1000 MG tablet 500 mg.      atorvastatin (LIPITOR) 80 MG tablet Take 1 tablet (80 mg total) by mouth daily. 90 tablet 3   Cholecalciferol (VITAMIN D3) 50 MCG (2000 UT) CAPS Take 2,000 Units by mouth daily.     diltiazem (CARDIZEM CD) 240 MG 24 hr capsule Take 240 mg by mouth at bedtime.      levothyroxine (SYNTHROID) 88 MCG tablet Take 88 mcg by mouth daily before breakfast.      losartan (COZAAR) 50 MG tablet TAKE (1 & 1/2) TABLETS BY MOUTH DAILY. 135 tablet 3   saw palmetto 500 MG capsule Take 500 mg by mouth 2 (two) times daily.      sotalol (BETAPACE) 160 MG tablet TAKE (1) TABLET BY  MOUTH TWICE DAILY. 180 tablet 3   warfarin (COUMADIN) 5 MG tablet TAKE (1) TABLET BY MOUTH DAILY AS DIRECTED. 100 tablet 1   zinc gluconate 50 MG tablet 50 mg daily.     No current facility-administered medications for this visit.     Past Surgical History:  Procedure Laterality Date   CARDIAC CATHETERIZATION  1996   CARDIAC CATHETERIZATION  06/18/1996   mid-circ tandem overlying stents with 20%,70%mid-LAD naroowin stent site in circ with 70% stenosis in the mid to distal marginal Johnny Reynolds,tandem 95% and 80% stenosis in the proximal portion of the posterior descending Johnny Reynolds of RCA    CATARACT EXTRACTION  1983   COLONOSCOPY N/A 09/04/2014   Procedure: COLONOSCOPY;  Surgeon: Johnny Hippo, MD;  [facility];  Service: Endoscopy;  Laterality: N/A;  1030   CORONARY ANGIOPLASTY WITH STENT PLACEMENT  08/18/1995   PTA/STENT CIRC   CORONARY ANGIOPLASTY WITH STENT PLACEMENT  06/18/1996   PTCA  posterior descending  Johnny Reynolds of the RCA 95 AND 80% TO LESSTHAN 20%   CORONARY STENT PLACEMENT  1996   DOPPLER ECHOCARDIOGRAPHY   04/17/2010   EF =50-55%; mild concentric LVH,LAE, AORTIC SCLEROSIS.TRACE TO MILD CENTRAL AI, TRACE MITRAL REGURGITATION, CANNOT ASSESS DIASTOLIC FUNCTIONDUE TO UNDERLYING RHYTHM,PACERMAKER WIRE IN THE RA/RV   DOPPLER ECHOCARDIOGRAPHY  04/21/2007   DONE IN NEW BERN-- MILD MITRAL REGURG., MILD TRICUSPID REGURG. WITH MILD PULM. HTN, MILD CONCENTRIC LEFT VENTRICULAR HYPERTROPHY,MILD LEFT ATRIAL ENLARGEMENT.   EYE SURGERY  1983   Cataract   HERNIA REPAIR Bilateral 2000   INGUINAL HERNIA REPAIR Right 12/13/2018   Procedure: HERNIA REPAIR INGUINAL ADULT WITH MESH;  Surgeon: Johnny Macho, MD;  Location: [facility];  Service: General;  Laterality: Right;   INSERT / REPLACE / REMOVE PACEMAKER  SEPT 2008   INSERT DUAL -CHAMBER-medtronic adapta ADDR01 serial   [id number]   NM MYOCAR MULTIPLE W/SPECT  04/17/2010   EF 50%; LOW NORMAL LV FUNCTION,   PACEMAKER INSERTION  2008   DUAL-CHAMBER   PPM GENERATOR CHANGEOUT N/A 12/29/2016   Procedure: PPM Generator Changeout;  Surgeon: Johnny Maw, MD;  [facility];  Service: Cardiovascular;  Laterality: N/A;     Allergies  Allergen Reactions   Fexofenadine Hcl Palpitations    Allegra   Rosuvastatin       Family History  Problem Relation Age of Onset   Peripheral vascular disease Mother    Hypertension Mother    Diabetes Mother    Heart disease Mother    Hyperlipidemia Mother    Stroke Father 59       TIA   Peripheral vascular disease Sister    Cancer Sister    Diabetes Sister    Heart disease Sister        Stent- Heart Disease before age 19   Hyperlipidemia Sister    Hypertension Sister    Peripheral vascular disease Brother    Diabetes Brother    Heart disease Brother        CHF and Heart Disease before age 52   Hyperlipidemia Brother    Hypertension Brother    Sleep apnea Neg Hx      Social History Johnny Reynolds reports that he has never smoked. He has never used smokeless tobacco. Johnny Reynolds reports no history of alcohol  use.   Review of Systems CONSTITUTIONAL: No weight loss, fever, chills, weakness or fatigue.  HEENT: Eyes: No visual loss, blurred vision, double vision or yellow sclerae.No hearing loss,  sneezing, congestion, runny nose or sore throat.  SKIN: No rash or itching.  CARDIOVASCULAR: per hpi RESPIRATORY: No shortness of breath, cough or sputum.  GASTROINTESTINAL: No anorexia, nausea, vomiting or diarrhea. No abdominal pain or blood.  GENITOURINARY: No burning on urination, no polyuria NEUROLOGICAL: No headache, dizziness, syncope, paralysis, ataxia, numbness or tingling in the extremities. No change in bowel or bladder control.  MUSCULOSKELETAL: No muscle, back pain, joint pain or stiffness.  LYMPHATICS: No enlarged nodes. No history of splenectomy.  PSYCHIATRIC: No history of depression or anxiety.  ENDOCRINOLOGIC: No reports of sweating, cold or heat intolerance. No polyuria or polydipsia.  Johnny Reynolds Kitchen   Physical Examination Today's Vitals   05/09/23 1346 05/09/23 1349  BP: (!) 142/78 (!) 144/78  Pulse: 75   SpO2: 98%   Weight: 160 lb 3.2 oz (72.7 kg)   Height: 5\' 6"  (1.676 m)    Body mass index is 25.86 kg/m.  Gen: resting comfortably, no acute distress HEENT: no scleral icterus, pupils equal round and reactive, no palptable cervical adenopathy,  CV: RRR, no m/rg, no jvd Resp: Clear to auscultation bilaterally GI: abdomen is soft, non-tender, non-distended, normal bowel sounds, no hepatosplenomegaly MSK: extremities are warm, no edema.  Skin: warm, no rash Neuro:  no focal deficits Psych: appropriate affect   Diagnostic Studies Echo 03/2010 [facility]: LVEF 50-55%, mild LVH, LAE     03/2010 [facility]: no ischemia, LVEF 50%    Assessment and Plan   1. AAA,   -mild AAA stable by recent imaging, continue to monitor   2. CAD   - no recent symptoms, continue current meds   3. Parox afib /acquired thrombophilia - he is not in favor of DOACs, remain on coumadin - denies symptoms,  continue current meds   4. Tachy-brady syndrome/Pacemaker -recent normal device check, denies any symptoms - continue to monitor   5. Hyperlipidemia -prior issues tolerating crestor, he is tolerating atorvastatin - continue current meds, upcoming labs with pcp   6. HTN - elevated in clinic, he will call with home bp's on Friday - room to titrate losartan if needed       Antoine Poche, M.D.

## 2023-05-09 NOTE — Patient Instructions (Addendum)
Medication Instructions:   Continue all current medications.   Labwork:  none  Testing/Procedures:  none  Follow-Up:  6 months   Any Other Special Instructions Will Be Listed Below (If Applicable).  Please call with update on blood pressure readings this Friday.   If you need a refill on your cardiac medications before your next appointment, please call your pharmacy.

## 2023-05-13 ENCOUNTER — Encounter: Payer: Self-pay | Admitting: Cardiology

## 2023-05-17 NOTE — Telephone Encounter (Signed)
BP's overall look ok, no changes at this time  Dominga Ferry MD

## 2023-06-06 ENCOUNTER — Encounter: Payer: Self-pay | Admitting: *Deleted

## 2023-06-07 ENCOUNTER — Telehealth: Payer: Medicare Other | Admitting: Adult Health

## 2023-06-07 DIAGNOSIS — G4733 Obstructive sleep apnea (adult) (pediatric): Secondary | ICD-10-CM

## 2023-06-07 NOTE — Progress Notes (Signed)
PATIENT: Johnny Reynolds DOB: Jan 10, 1941  REASON FOR VISIT: follow up HISTORY FROM: patient   Virtual Visit via Video Note  I connected with Willaim Sheng on 06/07/23 at  1:30 PM EDT by a video enabled telemedicine application located remotely at [facility] and verified that I am speaking with the correct person using two identifiers who was located at their own home.   I discussed the limitations of evaluation and management by telemedicine and the availability of in person appointments. The patient expressed understanding and agreed to proceed.   PATIENT: Johnny Reynolds DOB: September 05, 1940  REASON FOR VISIT: follow up HISTORY FROM: patient  HISTORY OF PRESENT ILLNESS: Today 06/07/23:  Johnny Reynolds is a 82 y.o. male with a history of obstructive sleep apnea on CPAP. Returns today for follow-up.  His download indicates that he uses machine 29 out of 30 days for compliance of 97%.  He uses machine greater than 4 hours each night.  On average he uses his machine 8 hours and 46 minutes.  His residual AHI is 0.4 on 5-10 cmH2O with EPR of 2.  He reports that his machine is giving him a message that it has exceeded motor life.  His last sleep study was in 2016.   06/15/22: [name] is an 82 year old male with a history of obstructive sleep apnea on CPAP.  He returns today for virtual visit.  His download indicates that he uses machine nightly for compliance of 100%.  He uses machine greater than 4 hours 29 days for compliance of 97%.  On average he uses his machine 8 hours and 34 minutes.  His residual AHI is 0.2 on 5 to 10 cm of water with EPR 2.  Leak in the 95th percentile is 2 L/min.  Overall he feels that the CPAP is working well for him.  Denies any new symptoms.   REVIEW OF SYSTEMS: Out of a complete 14 system review of symptoms, the patient complains only of the following symptoms, and all other reviewed systems are negative.  ESS 0  ALLERGIES: Allergies   Allergen Reactions   Fexofenadine Hcl Palpitations    Allegra   Rosuvastatin     HOME MEDICATIONS: Outpatient Medications Prior to Visit  Medication Sig Dispense Refill   Ascorbic Acid (VITAMIN C) 1000 MG tablet 500 mg.      atorvastatin (LIPITOR) 80 MG tablet Take 1 tablet (80 mg total) by mouth daily. 90 tablet 3   Cholecalciferol (VITAMIN D3) 50 MCG (2000 UT) CAPS Take 2,000 Units by mouth daily.     diltiazem (CARDIZEM CD) 240 MG 24 hr capsule Take 240 mg by mouth at bedtime.      levothyroxine (SYNTHROID) 88 MCG tablet Take 88 mcg by mouth daily before breakfast.      losartan (COZAAR) 50 MG tablet TAKE (1 & 1/2) TABLETS BY MOUTH DAILY. 135 tablet 3   saw palmetto 500 MG capsule Take 500 mg by mouth 2 (two) times daily.      sotalol (BETAPACE) 160 MG tablet TAKE (1) TABLET BY MOUTH TWICE DAILY. 180 tablet 3   warfarin (COUMADIN) 5 MG tablet TAKE (1) TABLET BY MOUTH DAILY AS DIRECTED. 100 tablet 1   zinc gluconate 50 MG tablet 50 mg daily.     No facility-administered medications prior to visit.    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 2008   USES C-PAP    PAST SURGICAL HISTORY: Past Surgical History:  Procedure Laterality Date   CARDIAC CATHETERIZATION  1996   CARDIAC CATHETERIZATION  06/18/1996   mid-circ tandem overlying stents with 20%,70%mid-LAD naroowin stent site in circ with 70% stenosis in the mid to distal marginal branch,tandem 95% and 80% stenosis in the proximal portion of the posterior descending branch of RCA    CATARACT EXTRACTION  1983   COLONOSCOPY N/A 09/04/2014   Procedure: COLONOSCOPY;  Surgeon: Johnny Hippo, MD;   [facility];  Service: Endoscopy;  Laterality: N/A;  1030   CORONARY ANGIOPLASTY WITH STENT PLACEMENT  08/18/1995   PTA/STENT CIRC   CORONARY ANGIOPLASTY WITH STENT PLACEMENT  06/18/1996   PTCA  posterior descending  branch of the RCA 95 AND 80% TO LESSTHAN 20%   CORONARY STENT PLACEMENT  1996   DOPPLER ECHOCARDIOGRAPHY  04/17/2010   EF =50-55%; mild concentric LVH,LAE, AORTIC SCLEROSIS.TRACE TO MILD CENTRAL AI, TRACE MITRAL REGURGITATION, CANNOT ASSESS DIASTOLIC FUNCTIONDUE TO UNDERLYING RHYTHM,PACERMAKER WIRE IN THE RA/RV   DOPPLER ECHOCARDIOGRAPHY  04/21/2007   DONE IN NEW BERN-- MILD MITRAL REGURG., MILD TRICUSPID REGURG. WITH MILD PULM. HTN, MILD CONCENTRIC LEFT VENTRICULAR HYPERTROPHY,MILD LEFT ATRIAL ENLARGEMENT.   EYE SURGERY  1983   Cataract   HERNIA REPAIR Bilateral 2000   INGUINAL HERNIA REPAIR Right 12/13/2018   Procedure: HERNIA REPAIR INGUINAL ADULT WITH MESH;  Surgeon: Franky Macho, MD;  Location: [facility];  Service: General;  Laterality: Right;   INSERT / REPLACE / REMOVE PACEMAKER  SEPT 2008   INSERT DUAL -CHAMBER-medtronic adapta ADDR01 serial   [id number]   NM MYOCAR MULTIPLE W/SPECT  04/17/2010   EF 50%; LOW NORMAL LV FUNCTION,   PACEMAKER INSERTION  2008   DUAL-CHAMBER   PPM GENERATOR CHANGEOUT N/A 12/29/2016   Procedure: PPM Generator Changeout;  Surgeon: Marinus Maw, MD;  [facility];  Service: Cardiovascular;  Laterality: N/A;    FAMILY HISTORY: Family History  Problem Relation Age of Onset   Peripheral vascular disease Mother    Hypertension Mother    Diabetes Mother    Heart disease Mother    Hyperlipidemia Mother    Stroke Father 66       TIA   Peripheral vascular disease Sister    Cancer Sister    Diabetes Sister    Heart disease Sister        Stent- Heart Disease before age 82   Hyperlipidemia Sister    Hypertension Sister    Peripheral vascular disease Brother    Diabetes Brother    Heart disease Brother        CHF  and Heart Disease before age 59   Hyperlipidemia Brother    Hypertension Brother    Sleep apnea Neg Hx     SOCIAL HISTORY: Social History   Socioeconomic History   Marital status: Married    Spouse name: Not on file   Number of children: Not on file   Years of education: Not on file   Highest education level: Not on file  Occupational History   Not on file  Tobacco Use  Smoking status: Never   Smokeless tobacco: Never  Vaping Use   Vaping status: Never Used  Substance and Sexual Activity   Alcohol use: No    Alcohol/week: 0.0 standard drinks of alcohol   Drug use: No   Sexual activity: Not on file  Other Topics Concern   Not on file  Social History Narrative   Controls his diabetes through diet and exercise.   Social Determinants of Health   Financial Resource Strain: Not on file  Food Insecurity: Not on file  Transportation Needs: Not on file  Physical Activity: Not on file  Stress: Not on file  Social Connections: Not on file  Intimate Partner Violence: Not on file      PHYSICAL EXAM Generalized: Well developed, in no acute distress   Neurological examination  Mentation: Alert oriented to time, place, history taking. Follows all commands speech and language fluent Cranial nerve II-XII:Facial symmetry noted.  Reflexes: UTA  DIAGNOSTIC DATA (LABS, IMAGING, TESTING) - I reviewed patient records, labs, notes, testing and imaging myself where available.  Lab Results  Component Value Date   WBC 4.8 04/19/2022   HGB 13.4 04/19/2022   HCT 40.0 04/19/2022   MCV 90.1 04/19/2022   PLT 157 04/19/2022      Component Value Date/Time   NA 140 04/19/2022 1013   K 3.3 (L) 04/19/2022 1013   CL 108 04/19/2022 1013   CO2 25 04/19/2022 1013   GLUCOSE 133 (H) 04/19/2022 1013   BUN 19 04/19/2022 1013   CREATININE 0.87 04/19/2022 1013   CREATININE 0.86 12/06/2017 1429   CALCIUM 8.5 (L) 04/19/2022 1013   PROT 5.9 (L) 12/06/2017 1429   ALBUMIN 4.4 07/03/2013 0914    AST 20 12/06/2017 1429   ALT 16 12/06/2017 1429   ALKPHOS 73 07/03/2013 0914   BILITOT 0.7 12/06/2017 1429   GFRNONAA >60 04/19/2022 1013   GFRAA >60 12/12/2018 1404   Lab Results  Component Value Date   CHOL 141 07/03/2013   HDL 45 07/03/2013   LDLCALC 81 07/03/2013   TRIG 77 07/03/2013   CHOLHDL 3.1 07/03/2013      ASSESSMENT AND PLAN 82 y.o. year old male  has a past medical history of AAA (abdominal aortic aneurysm) (HCC), Arrhythmia, Atrial fibrillation (HCC), Back pain, chronic, Coronary artery disease (1996 and 1997), Diabetes mellitus without complication (HCC), High cholesterol, Hypertension, Left shoulder pain (2014), and Sleep apnea (Sept. 2008). here with:  OSA on CPAP  CPAP compliance excellent Residual AHI is good Home sleep test pending results will order new machine Encouraged patient to continue using CPAP nightly and > 4 hours each night Follow-up once he gets a new machine  Butch Penny, MSN, NP-C 06/07/2023, 1:40 PM [facility] [address] [place], [place] [address] [phone]

## 2023-06-14 ENCOUNTER — Ambulatory Visit: Payer: Medicare Other | Attending: Cardiology | Admitting: *Deleted

## 2023-06-14 DIAGNOSIS — Z5181 Encounter for therapeutic drug level monitoring: Secondary | ICD-10-CM

## 2023-06-14 DIAGNOSIS — I4891 Unspecified atrial fibrillation: Secondary | ICD-10-CM

## 2023-06-14 LAB — POCT INR: INR: 2.8 (ref 2.0–3.0)

## 2023-06-14 NOTE — Patient Instructions (Signed)
Continue warfarin 1 tablet daily Continue greens Recheck in 6 weeks 

## 2023-06-21 ENCOUNTER — Encounter: Payer: Self-pay | Admitting: Adult Health

## 2023-06-29 NOTE — Telephone Encounter (Signed)
HST - [employer] medicare no auth req   Patient is scheduled at [facility] for 07/13/23 at 10 AM  Mailed packet to the patient.

## 2023-07-13 ENCOUNTER — Ambulatory Visit (INDEPENDENT_AMBULATORY_CARE_PROVIDER_SITE_OTHER): Payer: Medicare Other

## 2023-07-13 ENCOUNTER — Ambulatory Visit (INDEPENDENT_AMBULATORY_CARE_PROVIDER_SITE_OTHER): Payer: Medicare Other | Admitting: Neurology

## 2023-07-13 DIAGNOSIS — I495 Sick sinus syndrome: Secondary | ICD-10-CM

## 2023-07-13 DIAGNOSIS — G4739 Other sleep apnea: Secondary | ICD-10-CM

## 2023-07-13 DIAGNOSIS — G4733 Obstructive sleep apnea (adult) (pediatric): Secondary | ICD-10-CM | POA: Diagnosis not present

## 2023-07-13 LAB — CUP PACEART REMOTE DEVICE CHECK
Battery Remaining Longevity: 69 mo
Battery Voltage: 2.97 V
Brady Statistic AP VP Percent: 0.04 %
Brady Statistic AP VS Percent: 99.95 %
Brady Statistic AS VP Percent: 0 %
Brady Statistic AS VS Percent: 0.02 %
Brady Statistic RA Percent Paced: 99.99 %
Brady Statistic RV Percent Paced: 0.04 %
Date Time Interrogation Session: 20241120004330
Implantable Lead Connection Status: 753985
Implantable Lead Connection Status: 753985
Implantable Lead Implant Date: 20080902
Implantable Lead Implant Date: 20080902
Implantable Lead Location: 753859
Implantable Lead Location: 753860
Implantable Lead Model: 5076
Implantable Lead Model: 5076
Implantable Pulse Generator Implant Date: 20180509
Lead Channel Impedance Value: 361 Ohm
Lead Channel Impedance Value: 361 Ohm
Lead Channel Impedance Value: 456 Ohm
Lead Channel Impedance Value: 494 Ohm
Lead Channel Pacing Threshold Amplitude: 0.75 V
Lead Channel Pacing Threshold Amplitude: 1 V
Lead Channel Pacing Threshold Pulse Width: 0.4 ms
Lead Channel Pacing Threshold Pulse Width: 0.4 ms
Lead Channel Sensing Intrinsic Amplitude: 0.75 mV
Lead Channel Sensing Intrinsic Amplitude: 0.75 mV
Lead Channel Sensing Intrinsic Amplitude: 21.875 mV
Lead Channel Sensing Intrinsic Amplitude: 21.875 mV
Lead Channel Setting Pacing Amplitude: 2 V
Lead Channel Setting Pacing Amplitude: 2.5 V
Lead Channel Setting Pacing Pulse Width: 0.4 ms
Lead Channel Setting Sensing Sensitivity: 0.9 mV
Zone Setting Status: 755011
Zone Setting Status: 755011

## 2023-07-14 NOTE — Progress Notes (Signed)
[facility]  Johnny Reynolds "Johnny Reynolds" 82 year old male Apr 07, 1941   HOME SLEEP TEST REPORT ( by Watch PAT)   STUDY DATE:  07-14-2023    ORDERING CLINICIAN: Butch Penny, NP  REFERRING CLINICIAN:  Dr Phillips Odor and Dr Karna Dupes   CLINICAL INFORMATION/HISTORY:  Tachy-brady syndrome: dual chamber permanent pacemaker   - followed by EP. Atrial fibrillation patient with OSA , using CPAP for many years. Needing new device. CAD   -remote history per old clinic notes, prior angioplasty in 1996 and 1997 at [facility] 06/07/23: MM, NP Johnny Reynolds is a 82 y.o. male with a history of obstructive sleep apnea on CPAP. VIDEO VISIT follow-up.  His download indicates that he uses machine 29 out of 30 days for compliance of 97%.  He uses machine greater than 4 hours each night.  On average he uses his machine 8 hours and 46 minutes.  His residual AHI is 0.4 on 5-10 cmH2O with EPR of 2.  He reports that his machine is giving him a message that it has exceeded motor life.  His last sleep study was in 2016.     06/15/22: Johnny Reynolds is an 82 year old male with a history of obstructive sleep apnea on CPAP.  He returns today for virtual visit.  His download indicates that he uses machine nightly for compliance of 100%.  He uses machine greater than 4 hours 29 days for compliance of 97%.  On average he uses his machine 8 hours and 34 minutes.  His residual AHI is 0.2/h  on 5 to 10 cm of water with EPR 2.  Leak in the 95th percentile is 2 L/min.     Epworth sleepiness score: 0/24.   BMI: 25.9 kg/m   Neck Circumference: n/a   FINDINGS:   Sleep Summary:   Total Recording Time (hours, min): 7 hours 28 minutes      Total Sleep Time (hours, min):    6 hours 38 minutes             Percent REM (%):     10.4%                                   Respiratory Indices: AASM criteria/ (CMS criteria)   Calculated pAHI (per hour):   48/h       (35.6/h)   -the WatchPAT device  calculated over 30% of the total apneas to be central apneas.  REM pAHI: 49/h   (32/h)                                            NREM pAHI:   48/h  (36/h)                         Positional AHI: The patient slept 322 minutes in supine position with an AHI of 56.6/h versus 77 minutes on his left with an AHI of 11/h.  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  Oxygen Saturation Statistics:   Oxygen Saturation (%) Mean: 93%             O2 Saturation Range (%):   Between and nadir at 80% with a maximum saturation of 98%.                                    O2 Saturation (minutes) <89%:   7.8 minutes, equivalent of 2% of total sleep time.        Pulse Rate Statistics:   Pulse Mean (bpm): 70 bpm               Pulse Range:     Between 57 and 89 bpm.            IMPRESSION:  This HST confirms the presence of severe sleep apnea of COMPLEX character, this is not "just obstructive apnea". There was no prolonged hypoxia noted, and the heart rate was in normal range.  Continued use of positive airway pressure is indicated.   There are no additional recommendations of weight loss ( see BMI) but the patient can benefit from avoiding supine sleep.    RECOMMENDATION:  CPAP device , preferred ResMed auto-titration device with a setting between 5-10 cm water, 2 cm EPR , with heated humidification and an interface of patient choice ( This patient is experienced). RV within 90 days of use of this new device with NP or me.     INTERPRETING PHYSICIAN:   Melvyn Novas, MD

## 2023-07-18 ENCOUNTER — Encounter: Payer: Self-pay | Admitting: Adult Health

## 2023-07-26 ENCOUNTER — Ambulatory Visit: Payer: Medicare Other | Attending: Cardiology | Admitting: *Deleted

## 2023-07-26 DIAGNOSIS — Z5181 Encounter for therapeutic drug level monitoring: Secondary | ICD-10-CM

## 2023-07-26 DIAGNOSIS — I4891 Unspecified atrial fibrillation: Secondary | ICD-10-CM | POA: Diagnosis not present

## 2023-07-26 LAB — POCT INR: INR: 2.2 (ref 2.0–3.0)

## 2023-07-26 NOTE — Patient Instructions (Signed)
Continue warfarin 1 tablet daily Continue greens Recheck in 6 weeks 

## 2023-07-29 NOTE — Procedures (Signed)
[facility]  Johnny Reynolds "Johnny Reynolds" 82 year old male Apr 07, 1941   HOME SLEEP TEST REPORT ( by Watch PAT)   STUDY DATE:  07-14-2023    ORDERING CLINICIAN: Butch Penny, NP  REFERRING CLINICIAN:  Dr Phillips Odor and Dr Karna Dupes   CLINICAL INFORMATION/HISTORY:  Tachy-brady syndrome: dual chamber permanent pacemaker   - followed by EP. Atrial fibrillation patient with OSA , using CPAP for many years. Needing new device. CAD   -remote history per old clinic notes, prior angioplasty in 1996 and 1997 at [facility] 06/07/23: MM, NP Johnny Reynolds is a 82 y.o. male with a history of obstructive sleep apnea on CPAP. VIDEO VISIT follow-up.  His download indicates that he uses machine 29 out of 30 days for compliance of 97%.  He uses machine greater than 4 hours each night.  On average he uses his machine 8 hours and 46 minutes.  His residual AHI is 0.4 on 5-10 cmH2O with EPR of 2.  He reports that his machine is giving him a message that it has exceeded motor life.  His last sleep study was in 2016.     06/15/22: Johnny Reynolds is an 82 year old male with a history of obstructive sleep apnea on CPAP.  He returns today for virtual visit.  His download indicates that he uses machine nightly for compliance of 100%.  He uses machine greater than 4 hours 29 days for compliance of 97%.  On average he uses his machine 8 hours and 34 minutes.  His residual AHI is 0.2/h  on 5 to 10 cm of water with EPR 2.  Leak in the 95th percentile is 2 L/min.     Epworth sleepiness score: 0/24.   BMI: 25.9 kg/m   Neck Circumference: n/a   FINDINGS:   Sleep Summary:   Total Recording Time (hours, min): 7 hours 28 minutes      Total Sleep Time (hours, min):    6 hours 38 minutes             Percent REM (%):     10.4%                                   Respiratory Indices: AASM criteria/ (CMS criteria)   Calculated pAHI (per hour):   48/h       (35.6/h)   -the WatchPAT device  calculated over 30% of the total apneas to be central apneas.  REM pAHI: 49/h   (32/h)                                            NREM pAHI:   48/h  (36/h)                         Positional AHI: The patient slept 322 minutes in supine position with an AHI of 56.6/h versus 77 minutes on his left with an AHI of 11/h.  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  Oxygen Saturation Statistics:   Oxygen Saturation (%) Mean: 93%             O2 Saturation Range (%):   Between and nadir at 80% with a maximum saturation of 98%.                                    O2 Saturation (minutes) <89%:   7.8 minutes, equivalent of 2% of total sleep time.        Pulse Rate Statistics:   Pulse Mean (bpm): 70 bpm               Pulse Range:     Between 57 and 89 bpm.            IMPRESSION:  This HST confirms the presence of severe sleep apnea of COMPLEX character, this is not "just obstructive apnea". There was no prolonged hypoxia noted, and the heart rate was in normal range.  Continued use of positive airway pressure is indicated.   There are no additional recommendations of weight loss ( see BMI) but the patient can benefit from avoiding supine sleep.    RECOMMENDATION:  CPAP device , preferred ResMed auto-titration device with a setting between 5-10 cm water, 2 cm EPR , with heated humidification and an interface of patient choice ( This patient is experienced). RV within 90 days of use of this new device with NP or me.     INTERPRETING PHYSICIAN:   Melvyn Novas, MD

## 2023-08-03 NOTE — Addendum Note (Signed)
Addended by: Enedina Finner on: 08/03/2023 08:12 AM   Modules accepted: Orders

## 2023-08-04 NOTE — Telephone Encounter (Signed)
New order placed and received confirmation.

## 2023-08-08 NOTE — Telephone Encounter (Signed)
Called pt and LVM asking for call back. I also sent him a [employer]. He will need an initial follow-up visit scheduled approx 80 days from today's date.

## 2023-08-08 NOTE — Telephone Encounter (Signed)
Pt scheduled an appt with Dr. Vickey Huger due to there was not an appointment available with Aundra Millet, NP 80 days from today's date.

## 2023-08-08 NOTE — Telephone Encounter (Signed)
     08/03/23  8:11 AM Result Note Order placed for new machine Butch Penny, NP     08/03/23  8:11 AM Result Comment Sleep test confirmed severe sleep apnea. Will order new machine for you.

## 2023-08-08 NOTE — Telephone Encounter (Signed)
Ok thank you 

## 2023-08-09 NOTE — Progress Notes (Signed)
Remote pacemaker transmission.   

## 2023-09-06 ENCOUNTER — Ambulatory Visit: Payer: Medicare Other | Attending: Cardiology | Admitting: *Deleted

## 2023-09-06 DIAGNOSIS — Z5181 Encounter for therapeutic drug level monitoring: Secondary | ICD-10-CM

## 2023-09-06 DIAGNOSIS — I4891 Unspecified atrial fibrillation: Secondary | ICD-10-CM | POA: Diagnosis not present

## 2023-09-06 LAB — POCT INR: INR: 3 (ref 2.0–3.0)

## 2023-09-06 NOTE — Patient Instructions (Signed)
 Continue warfarin 1 tablet daily  Continue greens Recheck in 6 weeks

## 2023-10-12 ENCOUNTER — Ambulatory Visit (INDEPENDENT_AMBULATORY_CARE_PROVIDER_SITE_OTHER): Payer: Medicare Other

## 2023-10-12 DIAGNOSIS — I495 Sick sinus syndrome: Secondary | ICD-10-CM | POA: Diagnosis not present

## 2023-10-13 ENCOUNTER — Encounter: Payer: Self-pay | Admitting: Internal Medicine

## 2023-10-13 ENCOUNTER — Telehealth: Payer: Medicare Other | Admitting: Neurology

## 2023-10-13 ENCOUNTER — Telehealth: Payer: Self-pay | Admitting: Neurology

## 2023-10-13 ENCOUNTER — Encounter: Payer: Self-pay | Admitting: Neurology

## 2023-10-13 DIAGNOSIS — G4739 Other sleep apnea: Secondary | ICD-10-CM | POA: Insufficient documentation

## 2023-10-13 DIAGNOSIS — G4733 Obstructive sleep apnea (adult) (pediatric): Secondary | ICD-10-CM | POA: Diagnosis not present

## 2023-10-13 LAB — CUP PACEART REMOTE DEVICE CHECK
Battery Remaining Longevity: 66 mo
Battery Voltage: 2.97 V
Brady Statistic AP VP Percent: 0.03 %
Brady Statistic AP VS Percent: 99.78 %
Brady Statistic AS VP Percent: 0 %
Brady Statistic AS VS Percent: 0.19 %
Brady Statistic RA Percent Paced: 99.85 %
Brady Statistic RV Percent Paced: 0.03 %
Date Time Interrogation Session: 20250219004345
Implantable Lead Connection Status: 753985
Implantable Lead Connection Status: 753985
Implantable Lead Implant Date: 20080902
Implantable Lead Implant Date: 20080902
Implantable Lead Location: 753859
Implantable Lead Location: 753860
Implantable Lead Model: 5076
Implantable Lead Model: 5076
Implantable Pulse Generator Implant Date: 20180509
Lead Channel Impedance Value: 380 Ohm
Lead Channel Impedance Value: 380 Ohm
Lead Channel Impedance Value: 456 Ohm
Lead Channel Impedance Value: 532 Ohm
Lead Channel Pacing Threshold Amplitude: 0.75 V
Lead Channel Pacing Threshold Amplitude: 1 V
Lead Channel Pacing Threshold Pulse Width: 0.4 ms
Lead Channel Pacing Threshold Pulse Width: 0.4 ms
Lead Channel Sensing Intrinsic Amplitude: 0.875 mV
Lead Channel Sensing Intrinsic Amplitude: 0.875 mV
Lead Channel Sensing Intrinsic Amplitude: 23.375 mV
Lead Channel Sensing Intrinsic Amplitude: 23.375 mV
Lead Channel Setting Pacing Amplitude: 2 V
Lead Channel Setting Pacing Amplitude: 2.5 V
Lead Channel Setting Pacing Pulse Width: 0.4 ms
Lead Channel Setting Sensing Sensitivity: 0.9 mV
Zone Setting Status: 755011
Zone Setting Status: 755011

## 2023-10-13 NOTE — Progress Notes (Signed)
 Virtual Visit via Video Note  I connected with Johnny Reynolds on 10/13/23 at 10:30 AM EST by a video enabled telemedicine application and verified that I am speaking with the correct person using two identifiers.  Location: Patient: at home Provider: at her office, [facility]    I discussed the limitations of evaluation and management by telemedicine and the availability of in person appointments. The patient expressed understanding and agreed to proceed.  History of Present Illness:   10-13-2023  On CPAP since 2009 - Likes the new machine. Settings below ,  This new ResMed auto titrator CPAP was issued in January 2025 and works well for him. He is using his mask with a chin strap,  the 95% pressure was 9.1 cm water pressure.  But his air leak remains high- but this doesn't influence his AHI.  He continues to use the machine with a brief RAMP time.  Has 2-3 nocturias every night.   Tachy-brady syndrome: dual chamber permanent pacemaker   - followed by EP. Atrial fibrillation patient with OSA , using CPAP since 2008-2009  CAD   -remote history per old clinic notes, prior angioplasty in 1996 and 1997 at [facility]. No problem falling asleep, and no EDS.   06/07/23: MM, NP MATEJ SAPPENFIELD is a 83 y.o. male with a history of obstructive sleep apnea on CPAP. VIDEO VISIT follow-up.  His download indicates that he uses machine 29 out of 30 days for compliance of 97%.  He uses machine greater than 4 hours each night.  On average he uses his machine 8 hours and 46 minutes.  His residual AHI is 0.4 on 5-10 cmH2O with EPR of 2.  He reports that his machine is giving him a message that it has exceeded motor life.  His last sleep study was in 2016. MM ordered a HST :  Calculated pAHI (per hour):   48/h       (35.6/h)   -the WatchPAT device calculated over 30% of the total apneas to be central apneas.   REM pAHI: 49/h   (32/h)   NREM pAHI:   48/h  (36/h)                         Positional AHI: The patient  slept 322 minutes in supine position with an AHI of 56.6/h versus 77 minutes on his left with an AHI of 11/h.  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      This HST confirms the presence of severe sleep apnea of COMPLEX character, this is not "just obstructive apnea". There was no prolonged hypoxia noted, and the heart rate was in normal range.  Continued use of positive airway pressure is indicated.   There are no additional recommendations of weight loss ( see BMI) but the patient can benefit from avoiding supine sleep.                                               Observations/Objective:: Epworth score : 0/24.  On CPAP since 2009 - Likes the new machine. Settings below ,  This new ResMed auto titrator CPAP was issued in January 2025 and works well for him. He is using his mask with a chin strap,  the 95% pressure was 9.1 cm water pressure.  But his air leak  remains high- but this doesn't influence his AHI.  He continues to use the machine with a brief RAMP time.  Has 2-3 nocturias every night.    Assessment and Plan: Tachy-brady syndrome: dual chamber permanent pacemaker   - followed by EP. Atrial fibrillation patient with OSA , using CPAP since 2008-2009  CAD   -remote history per old clinic notes, prior angioplasty in 1996 and 1997 at [facility]. No problem falling asleep, and no EDS.   Rv in 12 months , with NP.   Follow Up Instructions: 12 months , yearly regular visit with CPAP.     I discussed the assessment and treatment plan with the patient. The patient was provided an opportunity to ask questions and all were answered. The patient agreed with the plan and demonstrated an understanding of the instructions.   The patient was advised to call back or seek an in-person evaluation if the symptoms worsen or if the condition fails to improve as anticipated.  I provided 12 minutes of non-face-to-face time during this encounter.   Melvyn Novas, MD

## 2023-10-13 NOTE — Telephone Encounter (Signed)
 LVM and sent mychart msg asking pt to call back to schedule follow up with NP for one year

## 2023-10-13 NOTE — Patient Instructions (Addendum)
 Tachy-brady syndrome: dual chamber permanent pacemaker . No tachy-brady cardia since pacemaker ,   followed by EP. Atrial fibrillation patient with OSA , using CPAP since 2008-2009  CAD   -remote history per old clinic notes, prior angioplasty in 1996 and 1997 at [facility]. No problem falling asleep, and no EDS.   HST repeated Dec 29562.  Calculated pAHI (per hour):   48/h       (35.6/h)   -the WatchPAT device calculated over 30% of the total apneas to be central apneas. New machine was  issued.   Excellent response to CPAP, high compliance.     CPAP and BIPAP Information CPAP and BIPAP use air pressure to keep your airways open and help you breathe well. CPAP and BIPAP use different amounts of pressure. Your health care provider will tell you whether CPAP or BIPAP would be best for you. CPAP stands for continuous positive airway pressure. With CPAP, the amount of pressure stays the same while you breathe in and out. BIPAP stands for bi-level positive airway pressure. With BIPAP, the amount of pressure will be higher when you breathe in and lower when you breathe out. This allows you to take bigger breaths. CPAP or BIPAP may be used in the hospital or at home. You may need to have a sleep study before your provider can order a device for you to use at home. What are the advantages? CPAP and BIPAP are most often used for obstructive sleep apnea to keep the airways from collapsing when the muscles relax during sleep. CPAP or BIPAP can be used if you have: Chronic obstructive pulmonary disease. Heart failure. Medical conditions that cause muscle weakness. Other problems that cause breathing to be shallow, weak, or difficult. What are the risks? Your provider will talk with you about risks. These may include: Sores on your nose or face caused from the mask, prongs, or nasal pillows. Dry or stuffy nose or nosebleeds. Feeling gassy or bloated. Sinus or lung infection if the equipment is  not cleaned well. When should CPAP or BIPAP be used? In most cases, CPAP or BIPAP is used during sleep at night or whenever the main sleep time happens. It's also used during naps. People with some medical conditions may need to wear the mask when they're awake. Follow instructions from your provider about when to use your CPAP or BIPAP. What happens during CPAP or BIPAP?  Both CPAP and BIPAP use a small machine that uses electricity to create air pressure. A long tube connects the device to a plastic mask. Air is blown through the mask into your nose or mouth. The amount of pressure that's used to blow the air can be adjusted. Your provider will set the pressure setting and help you find the best mask for you. Tips for using the mask There are different types and sizes of masks. If your mask does not fit well, talk with your provider about getting a different one. Some common types of masks include: Full face masks, which fit over the mouth and nose. Nasal masks, which fit over the nose. Nasal pillow or prong masks, which fit into the nostrils. The mask needs to be snug to your face, so some people feel trapped or closed in at first. If you feel this way, you may need to get used to the mask. Hold the mask loosely over your nose or mouth and then gradually put the the mask on more snugly. Slowly increase the amount  of time you use the mask. If you have trouble with your mask not fitting well or leaking, talk with your provider. Do not stop using the mask. Tips for using the device Follow instructions from your provider about how to and how often to use the device. For home use, CPAP and BIPAP devices come from home health care companies. There are many different brands. Your health insurance company will help to decide which device you get. Keep the CPAP or BIPAP device and attachments clean. Ask your home health care company or check the instruction book for cleaning instructions. Make sure the  humidifier is filled with germ-free (sterile) water and is working correctly. This will help prevent a dry or stuffy nose or nosebleeds. A nasal saline mist or spray may keep your nose from getting dry and sore. Do not eat or drink while the CPAP or BIPAP device is on. Food or drinks could get pushed into your lungs by the pressure of the CPAP or BIPAP. Follow these instructions at home: Take over-the-counter and prescription medicines only as told by your provider. Do not smoke, vape, or use nicotine or tobacco. Contact a health care provider if: You have redness or pressure sores on your head, face, mouth, or nose from the mask or headgear. You have trouble using the CPAP or BIPAP device. You have trouble going to sleep or staying asleep. Someone tells you that you snore even when wearing your CPAP or BIPAP device. Get help right away if: You have trouble breathing. You feel confused. These symptoms may be an emergency. Get help right away. Call 911. Do not wait to see if the symptoms will go away. Do not drive yourself to the hospital. This information is not intended to replace advice given to you by your health care provider. Make sure you discuss any questions you have with your health care provider. Document Revised: 12/01/2022 Document Reviewed: 12/01/2022 Elsevier Patient Education  2024 Elsevier Inc.    - followed by EP. Atrial fibrillation patient with OSA , using CPAP since 2008-2009  CAD   -remote history per old clinic notes, prior angioplasty in 1996 and 1997 at [facility]. No problem falling asleep, and no EDS.   Excellent response to CPAP, high compliance.

## 2023-10-17 NOTE — Telephone Encounter (Signed)
Pt called  you back.

## 2023-10-18 ENCOUNTER — Ambulatory Visit: Payer: Medicare Other | Attending: Cardiology | Admitting: *Deleted

## 2023-10-18 DIAGNOSIS — Z5181 Encounter for therapeutic drug level monitoring: Secondary | ICD-10-CM

## 2023-10-18 DIAGNOSIS — I4891 Unspecified atrial fibrillation: Secondary | ICD-10-CM

## 2023-10-18 LAB — POCT INR: INR: 2.7 (ref 2.0–3.0)

## 2023-10-18 NOTE — Patient Instructions (Signed)
 Continue warfarin 1 tablet daily  Continue greens Recheck in 6 weeks

## 2023-10-31 ENCOUNTER — Other Ambulatory Visit: Payer: Self-pay

## 2023-10-31 ENCOUNTER — Emergency Department (HOSPITAL_COMMUNITY)
Admission: EM | Admit: 2023-10-31 | Discharge: 2023-10-31 | Disposition: A | Discharge status: Home / Self Care | Attending: Emergency Medicine | Admitting: Emergency Medicine

## 2023-10-31 ENCOUNTER — Emergency Department (HOSPITAL_COMMUNITY)

## 2023-10-31 ENCOUNTER — Encounter (HOSPITAL_COMMUNITY): Payer: Self-pay

## 2023-10-31 DIAGNOSIS — R110 Nausea: Secondary | ICD-10-CM | POA: Insufficient documentation

## 2023-10-31 DIAGNOSIS — R197 Diarrhea, unspecified: Secondary | ICD-10-CM | POA: Diagnosis not present

## 2023-10-31 DIAGNOSIS — R339 Retention of urine, unspecified: Secondary | ICD-10-CM | POA: Diagnosis present

## 2023-10-31 DIAGNOSIS — Z7901 Long term (current) use of anticoagulants: Secondary | ICD-10-CM | POA: Insufficient documentation

## 2023-10-31 DIAGNOSIS — R140 Abdominal distension (gaseous): Secondary | ICD-10-CM | POA: Insufficient documentation

## 2023-10-31 DIAGNOSIS — R338 Other retention of urine: Secondary | ICD-10-CM

## 2023-10-31 LAB — URINALYSIS, ROUTINE W REFLEX MICROSCOPIC
Bilirubin Urine: NEGATIVE
Glucose, UA: NEGATIVE mg/dL
Hgb urine dipstick: NEGATIVE
Ketones, ur: NEGATIVE mg/dL
Leukocytes,Ua: NEGATIVE
Nitrite: NEGATIVE
Protein, ur: NEGATIVE mg/dL
Specific Gravity, Urine: 1.011 (ref 1.005–1.030)
pH: 7 (ref 5.0–8.0)

## 2023-10-31 LAB — CBC WITH DIFFERENTIAL/PLATELET
Abs Immature Granulocytes: 0.05 10*3/uL (ref 0.00–0.07)
Basophils Absolute: 0 10*3/uL (ref 0.0–0.1)
Basophils Relative: 0 %
Eosinophils Absolute: 0 10*3/uL (ref 0.0–0.5)
Eosinophils Relative: 0 %
HCT: 39.8 % (ref 39.0–52.0)
Hemoglobin: 13.5 g/dL (ref 13.0–17.0)
Immature Granulocytes: 0 %
Lymphocytes Relative: 9 %
Lymphs Abs: 1 10*3/uL (ref 0.7–4.0)
MCH: 31 pg (ref 26.0–34.0)
MCHC: 33.9 g/dL (ref 30.0–36.0)
MCV: 91.3 fL (ref 80.0–100.0)
Monocytes Absolute: 0.5 10*3/uL (ref 0.1–1.0)
Monocytes Relative: 5 %
Neutro Abs: 9.7 10*3/uL — ABNORMAL HIGH (ref 1.7–7.7)
Neutrophils Relative %: 86 %
Platelets: 160 10*3/uL (ref 150–400)
RBC: 4.36 MIL/uL (ref 4.22–5.81)
RDW: 14.2 % (ref 11.5–15.5)
WBC: 11.2 10*3/uL — ABNORMAL HIGH (ref 4.0–10.5)
nRBC: 0 % (ref 0.0–0.2)

## 2023-10-31 LAB — COMPREHENSIVE METABOLIC PANEL
ALT: 17 U/L (ref 0–44)
AST: 15 U/L (ref 15–41)
Albumin: 3.7 g/dL (ref 3.5–5.0)
Alkaline Phosphatase: 68 U/L (ref 38–126)
Anion gap: 12 (ref 5–15)
BUN: 19 mg/dL (ref 8–23)
CO2: 23 mmol/L (ref 22–32)
Calcium: 8.5 mg/dL — ABNORMAL LOW (ref 8.9–10.3)
Chloride: 94 mmol/L — ABNORMAL LOW (ref 98–111)
Creatinine, Ser: 0.72 mg/dL (ref 0.61–1.24)
GFR, Estimated: >60 mL/min (ref >60)
Glucose, Bld: 189 mg/dL — ABNORMAL HIGH (ref 70–99)
Potassium: 3.3 mmol/L — ABNORMAL LOW (ref 3.5–5.1)
Sodium: 129 mmol/L — ABNORMAL LOW (ref 135–145)
Total Bilirubin: 1 mg/dL (ref 0.0–1.2)
Total Protein: 5.9 g/dL — ABNORMAL LOW (ref 6.5–8.1)

## 2023-10-31 MED ORDER — POTASSIUM CHLORIDE CRYS ER 20 MEQ PO TBCR
40.0000 meq | EXTENDED_RELEASE_TABLET | Freq: Once | ORAL | Status: AC
  Administered 2023-10-31: 40 meq via ORAL
  Filled 2023-10-31: qty 2

## 2023-10-31 NOTE — ED Triage Notes (Signed)
 Pt arrived via POV c/o diarrhea since this morning and has been unable to micturate. Pt also endorsing pain.

## 2023-10-31 NOTE — ED Notes (Signed)
 Patient Alert and oriented to baseline. Stable and ambulatory to baseline. Patient verbalized understanding of the discharge instructions.  Patient belongings were taken by the patient.

## 2023-10-31 NOTE — ED Provider Notes (Signed)
 [place] [facility]ERG[facility] DEPARTMENT AT [facility] Provider Note   CSN: [id number] Arrival date & time: 10/31/23  1304     History {Add pertinent medical, surgical, social history, OB history to HPI:1} Chief Complaint  Patient presents with   oliguria    Johnny Reynolds is a 83 y.o. male.  HPI Patient with recent initiation of cyclobenzaprine and for back issues now presents with lower normal pain, loose stool.  He was generally well yesterday.  Today since morning he has had multiple episodes of loose stool, and increasing abdominal distention.  He has had no urination during this period, there is associated mild nausea, no vomiting, no fever, no chest pain, no dyspnea.  He is here with his wife who assists with history.    Home Medications Prior to Admission medications   Medication Sig Start Date End Date Taking? Authorizing Provider  Ascorbic Acid (VITAMIN C) 1000 MG tablet 500 mg.  03/23/20  Yes [provider]  atorvastatin (LIPITOR) 80 MG tablet Take 1 tablet (80 mg total) by mouth daily. 11/19/22 11/14/23 Yes BranchDorothe Pea, MD  Cholecalciferol (VITAMIN D3) 50 MCG (2000 UT) CAPS Take 2,000 Units by mouth daily.   Yes [provider]  cyclobenzaprine (FLEXERIL) 10 MG tablet Take 10 mg by mouth 3 (three) times daily. 10/26/23  Yes [provider]  diltiazem (CARDIZEM CD) 240 MG 24 hr capsule Take 240 mg by mouth at bedtime.  09/25/12  Yes [provider]  levothyroxine (SYNTHROID) 100 MCG tablet Take 100 mcg by mouth daily. 09/19/23  Yes [provider]  losartan (COZAAR) 50 MG tablet TAKE (1 & 1/2) TABLETS BY MOUTH DAILY. 04/14/23  Yes Antoine Poche, MD  predniSONE (DELTASONE) 10 MG tablet Take by mouth. 10/26/23  Yes [provider]  saw palmetto 500 MG capsule Take 500 mg by mouth 2 (two) times daily.    Yes [provider]  sotalol (BETAPACE) 160 MG tablet TAKE (1) TABLET BY MOUTH TWICE DAILY. 11/10/22  Yes  Antoine Poche, MD  warfarin (COUMADIN) 5 MG tablet TAKE (1) TABLET BY MOUTH DAILY AS DIRECTED. Patient taking differently: Take 5 mg by mouth daily. TAKE (1) TABLET BY MOUTH DAILY AS DIRECTED. 04/13/23  Yes Branch, Dorothe Pea, MD  zinc gluconate 50 MG tablet 50 mg daily. 10/08/19  Yes [provider]      Allergies    Fexofenadine hcl and Rosuvastatin    Review of Systems   Review of Systems  Physical Exam Updated Vital Signs BP (!) 175/107   Pulse 75   Temp 97.9 F (36.6 C) (Oral)   Resp 17   Ht 5\' 6"  (1.676 m)   Wt 72.6 kg   SpO2 99%   BMI 25.83 kg/m  Physical Exam Vitals and nursing note reviewed.  Constitutional:      General: He is not in acute distress.    Appearance: He is well-developed.  HENT:     Head: Normocephalic and atraumatic.  Eyes:     Conjunctiva/sclera: Conjunctivae normal.  Cardiovascular:     Rate and Rhythm: Normal rate and regular rhythm.  Pulmonary:     Effort: Pulmonary effort is normal. No respiratory distress.     Breath sounds: No stridor.  Abdominal:     General: There is distension.     Tenderness: There is abdominal tenderness.  Skin:    General: Skin is warm and dry.  Neurological:     Mental Status: He is  alert and oriented to person, place, and time.     ED Results / Procedures / Treatments   Labs (all labs ordered are listed, but only abnormal results are displayed) Labs Reviewed  URINALYSIS, ROUTINE W REFLEX MICROSCOPIC  COMPREHENSIVE METABOLIC PANEL  CBC WITH DIFFERENTIAL/PLATELET    EKG None  Radiology No results found.  Procedures Procedures  {Document cardiac monitor, telemetry assessment procedure when appropriate:1}  Medications Ordered in ED Medications - No data to display  ED Course/ Medical Decision Making/ A&P   {   Click here for ABCD2, HEART and other calculatorsREFRESH Note before signing :1}                              Medical Decision Making Adult male presents with acute  urinary retention, now with abdominal pain, loose stool, and no urination this morning.  Patient is awake, alert, no chest pain, dyspnea, no fever, no tach or hypotension suggesting bacteremia, sepsis.  Concern for infection versus obstruction versus prostatic complication.  Patient had bedside ultrasound which I reviewed with the nurse, greater than 1 L urine retained.  Patient with Foley catheter, labs sent, monitoring started. Cardiac 75 sinus normal Pulse ox 100% room air  Amount and/or Complexity of Data Reviewed Independent Historian: spouse External Data Reviewed: notes. Labs: ordered. Decision-making details documented in ED Course. Radiology: ordered and independent interpretation performed. Decision-making details documented in ED Course.  Risk Prescription drug management. Decision regarding hospitalization.   3:17 PM Patient markedly improved with placement of Foley catheter.  I reviewed the CT imaging with the patient and his wife at bedside demonstrating the substantial size of the bladder.  Patient remains hemodynamically unremarkable, likely appropriate for close outpatient follow-up pending CT, urinalysis.  {Document critical care time when appropriate:1} {Document review of labs and clinical decision tools ie heart score, Chads2Vasc2 etc:1}  {Document your independent review of radiology images, and any outside records:1} {Document your discussion with family members, caretakers, and with consultants:1} {Document social determinants of health affecting pt's care:1} {Document your decision making why or why not admission, treatments were needed:1} Final Clinical Impression(s) / ED Diagnoses Final diagnoses:  Acute urinary retention    Rx / DC Orders ED Discharge Orders     None

## 2023-10-31 NOTE — Discharge Instructions (Signed)
 Please be sure to schedule follow-up with our urology colleague.  Return here for concerning changes in your condition.

## 2023-10-31 NOTE — ED Provider Notes (Signed)
 CT is overall reassuring.  The bladder distention has already been taken care of with a Foley catheter and I suspect his hydroureteronephrosis will improve as well.  Kidney function is okay.  Will replace potassium for his mild hypokalemia.  Otherwise he has a known AAA but not increased in size and already follows with vascular.  Follow-up with urology.   Pricilla Loveless, MD 10/31/23 [phone]

## 2023-10-31 NOTE — ED Notes (Signed)
 Bladder scan shows >1000

## 2023-11-02 ENCOUNTER — Ambulatory Visit: Admitting: Urology

## 2023-11-02 ENCOUNTER — Encounter: Payer: Self-pay | Admitting: Urology

## 2023-11-02 VITALS — BP 167/88 | HR 85 | Temp 98.3°F

## 2023-11-02 DIAGNOSIS — N138 Other obstructive and reflux uropathy: Secondary | ICD-10-CM | POA: Insufficient documentation

## 2023-11-02 DIAGNOSIS — N401 Enlarged prostate with lower urinary tract symptoms: Secondary | ICD-10-CM | POA: Diagnosis not present

## 2023-11-02 DIAGNOSIS — R339 Retention of urine, unspecified: Secondary | ICD-10-CM | POA: Diagnosis not present

## 2023-11-02 DIAGNOSIS — Z978 Presence of other specified devices: Secondary | ICD-10-CM

## 2023-11-02 MED ORDER — CEPHALEXIN 250 MG PO CAPS
500.0000 mg | ORAL_CAPSULE | Freq: Once | ORAL | Status: AC
  Administered 2023-11-02: 500 mg via ORAL

## 2023-11-02 NOTE — Progress Notes (Signed)
 Fill and Pull Catheter Removal  Patient is present today for a catheter removal.  400 ml of sterile water was instilled into the bladder when the patient felt the urge to urinate. 10 ml of water was then drained from the balloon.  A 14 FR foley cath was removed from the bladder no complications were noted .  Foley catheter intact and time of removal. Patient as then given some time to void on their own.  Patient can void  375 ml on their own after some time.  Patient tolerated well.  One oral prophylactic antibiotic given per MD orders  Performed by: Kennyth Lose, CMA  Follow up/ Additional notes: PVR Check

## 2023-11-02 NOTE — Progress Notes (Deleted)
 Name: Johnny Reynolds DOB: Nov 09, 1940 MRN: [id number]  History of Present Illness: Johnny Reynolds is a 83 y.o. male who presents today as a new patient at [facility] [place]. All available relevant medical records have been reviewed. ***He is accompanied by ***.  He reports chief complaint of urinary retention.  Recent history:  > 10/31/2023:  - Seen in ER for acute urinary retention with abdominal pain and loose stools. Bladder scan showed >1 L urine retained. Patient markedly improved with placement of Foley catheter.  CMP with normal renal function (GFR >60; creatinine 0.72). CBC with mild leukocytosis (WBC 11.2). UA was unremarkable. CT showed "mild bilateral hydroureteronephrosis without obstructing calculus identified. There is a cyst in the right kidney measuring 2.8 cm. There some faint areas of hyperdensity adjacent to the superior margin of the cyst measuring up to 3 mm, new from prior." "Prostate gland is enlarged."  ***start Flomax then voiding trial in 1 week  Today: He {Actions; denies-reports:120008} history of urinary retention prior to current episode.  He reports the catheter is draining***.  He {Actions; denies-reports:120008} gross hematuria.  He {Actions; denies-reports:120008} flank pain or abdominal pain. He {Actions; denies-reports:120008} fevers, nausea, or vomiting.  He {Actions; denies-reports:120008} constipation. He *** taking laxatives, stool softeners, or fiber supplements.  Medications: Current Outpatient Medications  Medication Sig Dispense Refill   Ascorbic Acid (VITAMIN C) 1000 MG tablet 500 mg.      atorvastatin (LIPITOR) 80 MG tablet Take 1 tablet (80 mg total) by mouth daily. 90 tablet 3   Cholecalciferol (VITAMIN D3) 50 MCG (2000 UT) CAPS Take 2,000 Units by mouth daily.     cyclobenzaprine (FLEXERIL) 10 MG tablet Take 10 mg by mouth 3 (three) times daily.     diltiazem (CARDIZEM CD) 240 MG 24 hr capsule Take 240 mg by mouth at  bedtime.      levothyroxine (SYNTHROID) 100 MCG tablet Take 100 mcg by mouth daily.     losartan (COZAAR) 50 MG tablet TAKE (1 & 1/2) TABLETS BY MOUTH DAILY. 135 tablet 3   predniSONE (DELTASONE) 10 MG tablet Take by mouth.     saw palmetto 500 MG capsule Take 500 mg by mouth 2 (two) times daily.      sotalol (BETAPACE) 160 MG tablet TAKE (1) TABLET BY MOUTH TWICE DAILY. 180 tablet 3   warfarin (COUMADIN) 5 MG tablet TAKE (1) TABLET BY MOUTH DAILY AS DIRECTED. (Patient taking differently: Take 5 mg by mouth daily. TAKE (1) TABLET BY MOUTH DAILY AS DIRECTED.) 100 tablet 1   zinc gluconate 50 MG tablet 50 mg daily.     No current facility-administered medications for this visit.    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 2008   USES C-PAP   Past Surgical History:  Procedure Laterality Date   CARDIAC CATHETERIZATION  1996   CARDIAC CATHETERIZATION  06/18/1996   mid-circ tandem overlying stents with 20%,70%mid-LAD naroowin stent site in circ with 70% stenosis in  the mid to distal marginal branch,tandem 95% and 80% stenosis in the proximal portion of the posterior descending branch of RCA    CATARACT EXTRACTION  1983   COLONOSCOPY N/A 09/04/2014   Procedure: COLONOSCOPY;  Surgeon: Malissa Hippo, MD;  Location: [facility];  Service: Endoscopy;  Laterality: N/A;  1030   CORONARY ANGIOPLASTY WITH STENT PLACEMENT  08/18/1995   PTA/STENT CIRC   CORONARY ANGIOPLASTY WITH STENT PLACEMENT  06/18/1996   PTCA  posterior descending  branch of the RCA 95 AND 80% TO LESSTHAN 20%    CORONARY STENT PLACEMENT  1996   DOPPLER ECHOCARDIOGRAPHY  04/17/2010   EF =50-55%; mild concentric LVH,LAE, AORTIC SCLEROSIS.TRACE TO MILD CENTRAL AI, TRACE MITRAL REGURGITATION, CANNOT ASSESS DIASTOLIC FUNCTIONDUE TO UNDERLYING RHYTHM,PACERMAKER WIRE IN THE RA/RV   DOPPLER ECHOCARDIOGRAPHY  04/21/2007   DONE IN NEW BERN-- MILD MITRAL REGURG., MILD TRICUSPID REGURG. WITH MILD PULM. HTN, MILD CONCENTRIC LEFT VENTRICULAR HYPERTROPHY,MILD LEFT ATRIAL ENLARGEMENT.   EYE SURGERY  1983   Cataract   HERNIA REPAIR Bilateral 2000   INGUINAL HERNIA REPAIR Right 12/13/2018   Procedure: HERNIA REPAIR INGUINAL ADULT WITH MESH;  Surgeon: Franky Macho, MD;  Location: [facility];  Service: General;  Laterality: Right;   INSERT / REPLACE / REMOVE PACEMAKER  SEPT 2008   INSERT DUAL -CHAMBER-medtronic adapta ADDR01 serial   [id number]   NM MYOCAR MULTIPLE W/SPECT  04/17/2010   EF 50%; LOW NORMAL LV FUNCTION,   PACEMAKER INSERTION  2008   DUAL-CHAMBER   PPM GENERATOR CHANGEOUT N/A 12/29/2016   Procedure: PPM Generator Changeout;  Surgeon: Marinus Maw, MD;  Location: [facility];  Service: Cardiovascular;  Laterality: N/A;   Family History  Problem Relation Age of Onset   Peripheral vascular disease Mother    Hypertension Mother    Diabetes Mother    Heart disease Mother    Hyperlipidemia Mother    Stroke Father 65       TIA   Peripheral vascular disease Sister    Cancer Sister    Diabetes Sister    Heart disease Sister        Stent- Heart Disease before age 38   Hyperlipidemia Sister    Hypertension Sister    Peripheral vascular disease Brother    Diabetes Brother    Heart disease Brother        CHF and Heart Disease before age 55   Hyperlipidemia Brother    Hypertension Brother    Sleep apnea Neg Hx    Social History   Socioeconomic History   Marital status: Married    Spouse name: Not on file   Number of children: Not on file   Years of education: Not on file   Highest  education level: Not on file  Occupational History   Not on file  Tobacco Use   Smoking status: Never   Smokeless tobacco: Never  Vaping Use   Vaping status: Never Used  Substance and Sexual Activity   Alcohol use: No    Alcohol/week: 0.0 standard drinks of alcohol   Drug use: No   Sexual activity: Not on file  Other Topics Concern   Not on file  Social History Narrative   Controls his diabetes through diet and exercise.   Social Drivers of Corporate investment banker Strain: Not on file  Food Insecurity: Not on file  Transportation Needs: Not on file  Physical Activity: Not on file  Stress: Not on file  Social Connections: Not on file  Intimate Partner Violence: Not on file    SUBJECTIVE  Review of Systems Constitutional: Patient denies any unintentional weight loss or change in strength lntegumentary: Patient denies any rashes or pruritus Cardiovascular: Patient denies chest pain or syncope Respiratory: Patient denies shortness of breath Gastrointestinal: ***As per HPI Musculoskeletal: Patient denies muscle cramps or weakness Neurologic: Patient denies convulsions or seizures Allergic/Immunologic: Patient denies recent allergic reaction(s) Hematologic/Lymphatic: Patient denies bleeding tendencies Endocrine: Patient denies heat/cold intolerance  GU: As per HPI.  OBJECTIVE There were no vitals filed for this visit. There is no height or weight on file to calculate BMI.  Physical Examination Constitutional: No obvious distress; patient is non-toxic appearing  Cardiovascular: No visible lower extremity edema.  Respiratory: The patient does not have audible wheezing/stridor; respirations do not appear labored  Gastrointestinal: Abdomen non-distended Musculoskeletal: Normal ROM of UEs  Skin: No obvious rashes/open sores  Neurologic: CN 2-12 grossly intact Psychiatric: Answered questions appropriately with normal affect  Hematologic/Lymphatic/Immunologic: No  obvious bruises or sites of spontaneous bleeding  ***GU: Catheter draining ***clear yellow urine.   ASSESSMENT No diagnosis found. *** We discussed pt's urinary retention and possible etiologies including temporary detrusor areflexia, neurogenic bladder, ***BPH, constipation, anticholinergic medication use. Voiding trial was offered.  *** Voiding trial was done and pt was able to successfully empty bladder of the contents instilled. Foley catheter to remain out. Discussion was had about increasing water intake for the next few day to insure good voiding. Pt advised to return if unable to void, or go to ER if after clinic hours.  *** Voiding trial was performed and pt was unable to successfully empty bladder of the contents instilled. Will attempt voiding trial again in 2 weeks. If unable to pass voiding trial at that time, we will pursue further evaluation with urodynamic testing.  ***Foley catheter to remain in place. ***CIC teaching was performed by nursing staff and supplies were given/ordered from [facility] [employer]. Pt is aware they will need to perform CIC ***x/day. *** Advised pt to CIC as needed and pt is aware to call if needing to CIC regularly so we can order supplies for them. *** Topical lidocaine jelly ordered for use as lubricant for comfort; may also be applied to urethra prior to CIC.  We agreed to plan for follow up in *** months or sooner if needed.  Patient verbalized understanding of and agreement with current plan. All questions were answered.  PLAN Advised the following: Foley catheter ***discontinued. ***No follow-ups on file.  No orders of the defined types were placed in this encounter.   It has been explained that the patient is to follow regularly with their PCP in addition to all other providers involved in their care and to follow instructions provided by these respective offices. Patient advised to contact urology clinic if any urologic-pertaining questions,  concerns, new symptoms or problems arise in the interim period.  There are no Patient Instructions on file for this visit.  Electronically signed by:  Donnita Falls, MSN, FNP-C, CUNP 11/02/2023 8:37 AM

## 2023-11-02 NOTE — Progress Notes (Addendum)
 Name: Johnny Reynolds DOB: [birth year] MRN: [id number]  History of Present Illness: Johnny Reynolds is a 83 y.o. male who presents today as a new patient at [facility] [place]. All available relevant medical records have been reviewed. He is accompanied by his wife Johnny Reynolds.  He reports chief complaint of urinary retention.  He reports that he saw his PCP (Johnny Reynolds) about a week ago after he woke up one day with neck, back, and bilateral lower extremity stiffness. States he received two hip injections in the office and is currently taking Flexeril and a Prednisone taper. Denies pain, numbness, tingling, loss of strength or motor control, loss of urine / stool continence - just tightness throughout.  A few days after seeing Johnny Reynolds he went to the ER on 10/31/2023 for acute retention, abdominal pain, loose stools. Denies preceding constipation. CMP with normal renal function (GFR >60; creatinine 0.72). CBC with mild leukocytosis (WBC 11.2). UA unremarkable. CT showed mild bilateral hydroureteronephrosis, right renal cyst, prostatomegaly. No stones. Bladder scan with >1000 ml retained.  Indwelling Foley catheter placed with symptomatic improvement.  He denies history of urinary retention prior to current episode.   At baseline he reports nocturia x2; denies urinary urgency, frequency, incontinence, hesitancy, straining to void, or sensations of incomplete emptying. States he has taken saw palmetto for years. Denies history of elevated PSA.  He denies constipation. Takes flax seed daily to regulate stool consistency.    Of note he reports that he works on his computer for several hours per day while sitting in a custom chair which has a small seat he suspects may have been causing excess pressure in his perineal area.   Medications: Current Outpatient Medications  Medication Sig Dispense Refill   Ascorbic Acid (VITAMIN C) 1000 MG tablet 500 mg.      atorvastatin (LIPITOR) 80 MG  tablet Take 1 tablet (80 mg total) by mouth daily. 90 tablet 3   Cholecalciferol (VITAMIN D3) 50 MCG (2000 UT) CAPS Take 2,000 Units by mouth daily.     cyclobenzaprine (FLEXERIL) 10 MG tablet Take 10 mg by mouth 3 (three) times daily.     diltiazem (CARDIZEM CD) 240 MG 24 hr capsule Take 240 mg by mouth at bedtime.      levothyroxine (SYNTHROID) 100 MCG tablet Take 100 mcg by mouth daily.     losartan (COZAAR) 50 MG tablet TAKE (1 & 1/2) TABLETS BY MOUTH DAILY. 135 tablet 3   predniSONE (DELTASONE) 10 MG tablet Take by mouth.     saw palmetto 500 MG capsule Take 500 mg by mouth 2 (two) times daily.      sotalol (BETAPACE) 160 MG tablet TAKE (1) TABLET BY MOUTH TWICE DAILY. 180 tablet 3   warfarin (COUMADIN) 5 MG tablet TAKE (1) TABLET BY MOUTH DAILY AS DIRECTED. (Patient taking differently: Take 5 mg by mouth daily. TAKE (1) TABLET BY MOUTH DAILY AS DIRECTED.) 100 tablet 1   zinc gluconate 50 MG tablet 50 mg daily.     No current facility-administered medications for this visit.    Allergies: Allergies  Allergen Reactions   Fexofenadine Hcl Palpitations    Allegra   Rosuvastatin     Past Medical History:  Diagnosis Date   AAA (abdominal aortic aneurysm) (HCC)    Arrhythmia    Atrial fibrillation (HCC)    Back pain, chronic    Coronary artery disease 1996 and 1997   angioplasty of LAD   Diabetes mellitus without complication (HCC)  MILD TYPE 2   High cholesterol    Hypertension    Left shoulder pain 2014   Sleep apnea Sept. 2008   USES C-PAP   Past Surgical History:  Procedure Laterality Date   CARDIAC CATHETERIZATION  1996   CARDIAC CATHETERIZATION  06/18/1996   mid-circ tandem overlying stents with 20%,70%mid-LAD naroowin stent site in circ with 70% stenosis in the mid to distal marginal branch,tandem 95% and 80% stenosis in the proximal portion of the posterior descending branch of RCA    CATARACT EXTRACTION  1983   COLONOSCOPY N/A 09/04/2014   Procedure:  COLONOSCOPY;  Surgeon: Johnny Hippo, MD;  [facility]: [facility];  Service: Endoscopy;  Laterality: N/A;  1030   CORONARY ANGIOPLASTY WITH STENT PLACEMENT  08/18/1995   PTA/STENT CIRC   CORONARY ANGIOPLASTY WITH STENT PLACEMENT  06/18/1996   PTCA  posterior descending  branch of the RCA 95 AND 80% TO LESSTHAN 20%   CORONARY STENT PLACEMENT  1996   DOPPLER ECHOCARDIOGRAPHY  04/17/2010   EF =50-55%; mild concentric LVH,LAE, AORTIC SCLEROSIS.TRACE TO MILD CENTRAL AI, TRACE MITRAL REGURGITATION, CANNOT ASSESS DIASTOLIC FUNCTIONDUE TO UNDERLYING RHYTHM,PACERMAKER WIRE IN THE RA/RV   DOPPLER ECHOCARDIOGRAPHY  04/21/2007   DONE IN NEW BERN-- MILD MITRAL REGURG., MILD TRICUSPID REGURG. WITH MILD PULM. HTN, MILD CONCENTRIC LEFT VENTRICULAR HYPERTROPHY,MILD LEFT ATRIAL ENLARGEMENT.   EYE SURGERY  1983   Cataract   HERNIA REPAIR Bilateral 2000   INGUINAL HERNIA REPAIR Right 12/13/2018   Procedure: HERNIA REPAIR INGUINAL ADULT WITH MESH;  Surgeon: Johnny Macho, MD;  Location: [facility];  Service: General;  Laterality: Right;   INSERT / REPLACE / REMOVE PACEMAKER  SEPT 2008   INSERT DUAL -CHAMBER-medtronic adapta ADDR01 serial   [id number]   NM MYOCAR MULTIPLE W/SPECT  04/17/2010   EF 50%; LOW NORMAL LV FUNCTION,   PACEMAKER INSERTION  2008   DUAL-CHAMBER   PPM GENERATOR CHANGEOUT N/A 12/29/2016   Procedure: PPM Generator Changeout;  Surgeon: Marinus Maw, MD;  [facility];  Service: Cardiovascular;  Laterality: N/A;   Family History  Problem Relation Age of Onset   Peripheral vascular disease Mother    Hypertension Mother    Diabetes Mother    Heart disease Mother    Hyperlipidemia Mother    Stroke Father 39       TIA   Peripheral vascular disease Sister    Cancer Sister    Diabetes Sister    Heart disease Sister        Stent- Heart Disease before age 38   Hyperlipidemia Sister    Hypertension Sister    Peripheral vascular disease Brother    Diabetes Brother    Heart  disease Brother        CHF and Heart Disease before age 26   Hyperlipidemia Brother    Hypertension Brother    Sleep apnea Neg Hx    Social History   Socioeconomic History   Marital status: Married    Spouse name: Not on file   Number of children: Not on file   Years of education: Not on file   Highest education level: Not on file  Occupational History   Not on file  Tobacco Use   Smoking status: Never   Smokeless tobacco: Never  Vaping Use   Vaping status: Never Used  Substance and Sexual Activity   Alcohol use: No    Alcohol/week: 0.0 standard drinks of alcohol   Drug use: No  Sexual activity: Not on file  Other Topics Concern   Not on file  Social History Narrative   Controls his diabetes through diet and exercise.   Social Drivers of Corporate investment banker Strain: Not on file  Food Insecurity: Not on file  Transportation Needs: Not on file  Physical Activity: Not on file  Stress: Not on file  Social Connections: Not on file  Intimate Partner Violence: Not on file    SUBJECTIVE  Review of Systems Constitutional: Patient denies any unintentional weight loss or change in strength lntegumentary: Patient denies any rashes or pruritus Cardiovascular: Patient denies chest pain or syncope Respiratory: Patient denies shortness of breath Gastrointestinal: As per HPI Musculoskeletal: Patient denies muscle cramps or weakness Neurologic: Patient denies convulsions or seizures Allergic/Immunologic: Patient denies recent allergic reaction(s) Hematologic/Lymphatic: Patient denies bleeding tendencies Endocrine: Patient denies heat/cold intolerance  GU: As per HPI.  OBJECTIVE Vitals:   11/02/23 1000  BP: (!) 167/88  Pulse: 85  Temp: 98.3 F (36.8 C)   There is no height or weight on file to calculate BMI.  Physical Examination Constitutional: No obvious distress; patient is non-toxic appearing  Cardiovascular: No visible lower extremity edema.   Respiratory: The patient does not have audible wheezing/stridor; respirations do not appear labored  Gastrointestinal: Abdomen non-distended Musculoskeletal: Normal ROM of UEs  Skin: No obvious rashes/open sores  Neurologic: CN 2-12 grossly intact Psychiatric: Answered questions appropriately with normal affect  Hematologic/Lymphatic/Immunologic: No obvious bruises or sites of spontaneous bleeding   ASSESSMENT Benign localized hyperplasia of prostate with urinary obstruction - Plan: Bladder Voiding Trial, cephALEXin (KEFLEX) capsule 500 mg  Urinary retention - Plan: Bladder Voiding Trial, cephALEXin (KEFLEX) capsule 500 mg  Foley catheter in place - Plan: Bladder Voiding Trial, cephALEXin (KEFLEX) capsule 500 mg  We discussed pt's urinary retention and possible etiologies including temporary detrusor areflexia, neurogenic bladder, BPH/BOO, constipation, anticholinergic medication use, pelvic floor muscle dysfunction (possibly secondary to his custom chair / prolonged sitting at computer daily).   Voiding trial was done and pt was able to successfully empty bladder of the contents instilled. Foley catheter discontinued. Discussion was had about increasing water intake for the next few day to insure good voiding. Pt advised to return if unable to void, or go to ER if after clinic hours.   He was given sample bag of 14 fr. Coude catheters and instructed on how to perform clean intermittent catheterization (CIC) if needed. If needing to perform CIC routinely he is to notify our office so that an ongoing catheter order can be placed (#30 per month for PRN use); will need Coude catheters due to his prostatomegaly.   Will plan for follow up in 4 weeks or sooner if needed. Pt verbalized understanding and agreement. All questions were answered.   PLAN Advised the following: Foley catheter discontinued. Requesting records from PCP including recent visit notes and prior PSA results. Return in  about 4 weeks (around 11/30/2023) for nurse visit for voiding trial, UA, PVR, & f/u with Evette Georges NP.  Orders Placed This Encounter  Procedures   Bladder Voiding Trial   It has been explained that the patient is to follow regularly with their PCP in addition to all other providers involved in their care and to follow instructions provided by these respective offices. Patient advised to contact urology clinic if any urologic-pertaining questions, concerns, new symptoms or problems arise in the interim period.  Patient Instructions     Step 1 Get all of  your supplies ready and place them near you. Step 2 Wash your hands with warm, soapy water or put on gloves. Step 3 Wash around the tip of your penis with warm, soapy water or a castille soap towelette. Step 4 Take catheter out of package and drain the lubricant over toilet. Step 5 Hold the penis at a 45 degree angle from the stomach in one hand and the catheter in the other hand. Step 6 Insert the catheter slowly into your urethra. If there is resistance when the catheter reaches the sphincter muscle,              take a deep breath and gently apply steady pressure.              DO NOT FORCE THE CATHETER Step 7 When the urine begins to flow, insert another inch and lower penis. Allow the urine to flow into the toilet. Step 8 When the flow of urine stops, slowly remove the catheter.   Electronically signed by:  Donnita Falls, MSN, FNP-C, CUNP 11/07/2023 10:13 AM

## 2023-11-02 NOTE — Patient Instructions (Signed)
    Step 1 Get all of your supplies ready and place them near you. Step 2 Wash your hands with warm, soapy water or put on gloves. Step 3 Wash around the tip of your penis with warm, soapy water or a castille soap towelette. Step 4 Take catheter out of package and drain the lubricant over toilet. Step 5 Hold the penis at a 45 degree angle from the stomach in one hand and the catheter in the other hand. Step 6 Insert the catheter slowly into your urethra. If there is resistance when the catheter reaches the sphincter muscle,              take a deep breath and gently apply steady pressure.              DO NOT FORCE THE CATHETER Step 7 When the urine begins to flow, insert another inch and lower penis. Allow the urine to flow into the toilet. Step 8 When the flow of urine stops, slowly remove the catheter.

## 2023-11-04 ENCOUNTER — Telehealth: Payer: Self-pay | Admitting: Urology

## 2023-11-04 ENCOUNTER — Telehealth: Payer: Self-pay

## 2023-11-04 ENCOUNTER — Ambulatory Visit

## 2023-11-04 DIAGNOSIS — R339 Retention of urine, unspecified: Secondary | ICD-10-CM

## 2023-11-04 NOTE — Telephone Encounter (Signed)
 Johnny Reynolds from [facility] needs more info on Cath order [phone] Ext [phone]

## 2023-11-04 NOTE — Telephone Encounter (Signed)
 See below

## 2023-11-04 NOTE — Telephone Encounter (Signed)
 Called [facility] to inform Britta Mccreedy that I chose the incorrect cath for patient supplies 14 F straight and it should be corrected to 14 coude cath. Britta Mccreedy voiced she will correct the order and sent out patient's supplies.

## 2023-11-04 NOTE — Progress Notes (Signed)
 Continuous Intermittent Catheterization self teaching  Patient is present today for a teaching of self I & O Catheterization due to the inability to CIC the day previously. Patient was given detailed verbal and printed instructions of self catheterization. Patient was cleaned and prepped in a sterile fashion.  With instruction and assistance patient inserted a 14 coude and urine return was noted 350 ml, urine was  Amber in color with 2 clots passed. Patient tolerated well, no complications were noted. Patient was given a sample bag with supplies to take home.  Instructions were given per Evette Georges NP for patient to cath as needed daily.  An order was placed with [facility] for catheters to be sent to the patient's home. Patient is to follow up on April 9th at 9am  Performed by: Gwendolyn Grant T. CMA  Additional Notes: As scheduled

## 2023-11-09 ENCOUNTER — Emergency Department (HOSPITAL_COMMUNITY)
Admission: EM | Admit: 2023-11-09 | Discharge: 2023-11-09 | Disposition: A | Discharge status: Home / Self Care | Attending: Emergency Medicine | Admitting: Emergency Medicine

## 2023-11-09 ENCOUNTER — Encounter (HOSPITAL_COMMUNITY): Payer: Self-pay | Admitting: *Deleted

## 2023-11-09 ENCOUNTER — Telehealth: Payer: Self-pay

## 2023-11-09 ENCOUNTER — Other Ambulatory Visit: Payer: Self-pay

## 2023-11-09 ENCOUNTER — Ambulatory Visit (INDEPENDENT_AMBULATORY_CARE_PROVIDER_SITE_OTHER)

## 2023-11-09 DIAGNOSIS — I2510 Atherosclerotic heart disease of native coronary artery without angina pectoris: Secondary | ICD-10-CM | POA: Insufficient documentation

## 2023-11-09 DIAGNOSIS — E119 Type 2 diabetes mellitus without complications: Secondary | ICD-10-CM | POA: Insufficient documentation

## 2023-11-09 DIAGNOSIS — Z466 Encounter for fitting and adjustment of urinary device: Secondary | ICD-10-CM

## 2023-11-09 DIAGNOSIS — R339 Retention of urine, unspecified: Secondary | ICD-10-CM | POA: Insufficient documentation

## 2023-11-09 DIAGNOSIS — I10 Essential (primary) hypertension: Secondary | ICD-10-CM | POA: Insufficient documentation

## 2023-11-09 LAB — URINALYSIS, ROUTINE W REFLEX MICROSCOPIC
Bilirubin Urine: NEGATIVE
Glucose, UA: 50 mg/dL — AB
Ketones, ur: NEGATIVE mg/dL
Leukocytes,Ua: NEGATIVE
Nitrite: NEGATIVE
Protein, ur: NEGATIVE mg/dL
Specific Gravity, Urine: 1.008 (ref 1.005–1.030)
pH: 7 (ref 5.0–8.0)

## 2023-11-09 NOTE — ED Provider Notes (Signed)
 [facility]-EMERGENCY DEPT [facility] Emergency Department Provider Note MRN:  [id number]  Arrival date & time: 11/09/23     Chief Complaint   Urinary Retention   History of Present Illness   Johnny Reynolds is a 83 y.o. year-old male with a history of AAA, diabetes, enlarged prostate presenting to the ED with chief complaint of urinary retention.  Recent issues with urinary retention.  Had a catheter removed by urology, the plan was to have him self cath at home.  This evening he could not pass the catheter at home.  Persistent resistance within the urethra a few inches in, began feeling the sensation of building up of urine in his bladder, here for assistance.  Review of Systems  A thorough review of systems was obtained and all systems are negative except as noted in the HPI and PMH.   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 2008   USES C-PAP    Past Surgical History:  Procedure Laterality Date   CARDIAC CATHETERIZATION  1996   CARDIAC CATHETERIZATION  06/18/1996   mid-circ tandem overlying stents with 20%,70%mid-LAD naroowin stent site in circ with 70% stenosis in the mid to distal marginal branch,tandem 95% and 80% stenosis in the proximal portion of the posterior descending branch of RCA    CATARACT EXTRACTION  1983   COLONOSCOPY N/A 09/04/2014   Procedure: COLONOSCOPY;  Surgeon: Malissa Hippo, MD;  Location: [facility] ENDO SUITE;  Service: Endoscopy;  Laterality: N/A;  1030   CORONARY ANGIOPLASTY WITH STENT PLACEMENT  08/18/1995   PTA/STENT CIRC   CORONARY ANGIOPLASTY WITH STENT PLACEMENT  06/18/1996   PTCA  posterior descending  branch of  the RCA 95 AND 80% TO LESSTHAN 20%   CORONARY STENT PLACEMENT  1996   DOPPLER ECHOCARDIOGRAPHY  04/17/2010   EF =50-55%; mild concentric LVH,LAE, AORTIC SCLEROSIS.TRACE TO MILD CENTRAL AI, TRACE MITRAL REGURGITATION, CANNOT ASSESS DIASTOLIC FUNCTIONDUE TO UNDERLYING RHYTHM,PACERMAKER WIRE IN THE RA/RV   DOPPLER ECHOCARDIOGRAPHY  04/21/2007   DONE IN NEW BERN-- MILD MITRAL REGURG., MILD TRICUSPID REGURG. WITH MILD PULM. HTN, MILD CONCENTRIC LEFT VENTRICULAR HYPERTROPHY,MILD LEFT ATRIAL ENLARGEMENT.   EYE SURGERY  1983   Cataract   HERNIA REPAIR Bilateral 2000   INGUINAL HERNIA REPAIR Right 12/13/2018   Procedure: HERNIA REPAIR INGUINAL ADULT WITH MESH;  Surgeon: Franky Macho, MD;  Location: [facility] ORS;  Service: General;  Laterality: Right;   INSERT / REPLACE / REMOVE PACEMAKER  SEPT 2008   INSERT DUAL -CHAMBER-medtronic adapta ADDR01 serial   [id number]   NM MYOCAR MULTIPLE W/SPECT  04/17/2010   EF 50%; LOW NORMAL LV FUNCTION,   PACEMAKER INSERTION  2008   DUAL-CHAMBER   PPM GENERATOR CHANGEOUT N/A 12/29/2016   Procedure: PPM Generator Changeout;  Surgeon: Marinus Maw, MD;  Location: [facility] INVASIVE CV LAB;  Service: Cardiovascular;  Laterality: N/A;    Family History  Problem Relation Age of Onset   Peripheral vascular disease Mother    Hypertension Mother    Diabetes Mother    Heart disease Mother  Hyperlipidemia Mother    Stroke Father 65       TIA   Peripheral vascular disease Sister    Cancer Sister    Diabetes Sister    Heart disease Sister        Stent- Heart Disease before age 107   Hyperlipidemia Sister    Hypertension Sister    Peripheral vascular disease Brother    Diabetes Brother    Heart disease Brother        CHF and Heart Disease before age 75   Hyperlipidemia Brother    Hypertension Brother    Sleep apnea Neg Hx     Social History   Socioeconomic History   Marital status: Married    Spouse name: Not on file   Number of children: Not on file   Years of  education: Not on file   Highest education level: Not on file  Occupational History   Not on file  Tobacco Use   Smoking status: Never   Smokeless tobacco: Never  Vaping Use   Vaping status: Never Used  Substance and Sexual Activity   Alcohol use: No    Alcohol/week: 0.0 standard drinks of alcohol   Drug use: No   Sexual activity: Not on file  Other Topics Concern   Not on file  Social History Narrative   Controls his diabetes through diet and exercise.   Social Drivers of Corporate investment banker Strain: Not on file  Food Insecurity: Not on file  Transportation Needs: Not on file  Physical Activity: Not on file  Stress: Not on file  Social Connections: Not on file  Intimate Partner Violence: Not on file     Physical Exam   Vitals:   11/09/23 0420 11/09/23 0423  BP: (!) 144/86   Pulse: 75   Resp:  16  Temp: (!) 97.5 F (36.4 C)   SpO2: 97%     CONSTITUTIONAL: Well-appearing, NAD NEURO/PSYCH:  Alert and oriented x 3, no focal deficits EYES:  eyes equal and reactive ENT/NECK:  no LAD, no JVD CARDIO: Regular rate, well-perfused, normal S1 and S2 PULM:  CTAB no wheezing or rhonchi GI/GU:  non-distended, non-tender MSK/SPINE:  No gross deformities, no edema SKIN:  no rash, atraumatic   *Additional and/or pertinent findings included in MDM below  Diagnostic and Interventional Summary    EKG Interpretation Date/Time:    Ventricular Rate:    PR Interval:    QRS Duration:    QT Interval:    QTC Calculation:   R Axis:      Text Interpretation:         Labs Reviewed  URINALYSIS, ROUTINE W REFLEX MICROSCOPIC - Abnormal; Notable for the following components:      Result Value   Color, Urine STRAW (*)    Glucose, UA 50 (*)    Hgb urine dipstick SMALL (*)    Bacteria, UA RARE (*)    All other components within normal limits    No orders to display    Medications - No data to display   Procedures  /  Critical Care Procedures  ED Course and  Medical Decision Making  Initial Impression and Ddx Urinary retention, likely related to underlying large prostate.  UTI also considered.  Abdomen is soft and nontender.  Nearly 800 cc on bladder scan.  Placing Foley catheter.  Past medical/surgical history that increases complexity of ED encounter: Enlarged prostate  Interpretation of Diagnostics I personally reviewed the Laboratory Testing  and my interpretation is as follows: No signs of urinary tract infection on UA    Patient Reassessment and Ultimate Disposition/Management     Foley catheter placed without issue, appropriate for discharge.  Patient management required discussion with the following services or consulting groups:  None  Complexity of Problems Addressed Acute complicated illness or Injury  Additional Data Reviewed and Analyzed Further history obtained from: Recent Consult notes and Prior labs/imaging results  Additional Factors Impacting ED Encounter Risk None  Elmer Sow. Pilar Plate, MD [facility] Emergency Medicine [facility] [email or web address]  Final Clinical Impressions(s) / ED Diagnoses     ICD-10-CM   1. Urinary retention  R33.9       ED Discharge Orders     None        Discharge Instructions Discussed with and Provided to Patient:    Discharge Instructions      You were evaluated in the Emergency Department and after careful evaluation, we did not find any emergent condition requiring admission or further testing in the hospital.  Your exam/testing today is overall reassuring.  Recommend follow-up with urology to discuss your symptoms.  Call the office number to move your appointment up.  Please return to the Emergency Department if you experience any worsening of your condition.   Thank you for allowing [place] to be a part of your care.      Sabas Sous, MD 11/09/23 [phone]

## 2023-11-09 NOTE — Telephone Encounter (Signed)
   FRIDAY REUSING CATH -   USING CATH  HAD 2 LITERS THIS MORNING 4 -6   [phone]

## 2023-11-09 NOTE — ED Triage Notes (Signed)
 Pt states he has not been able to void or self cath for the last 2 hours  Pt was seen here last week for urinary retention and a catheter was placed   Pt followed up with urology and was given caths to self cath if he was unable to urinate on his own

## 2023-11-09 NOTE — Progress Notes (Signed)
 Patient seen today to verify catheter was in the correct spot. Catheter is operating as it should. Patient states he doesn't want to CIC anymore and will deal with the catheter bag.

## 2023-11-09 NOTE — Discharge Instructions (Addendum)
 You were evaluated in the Emergency Department and after careful evaluation, we did not find any emergent condition requiring admission or further testing in the hospital.  Your exam/testing today is overall reassuring.  Recommend follow-up with urology to discuss your symptoms.  Call the office number to move your appointment up.  Please return to the Emergency Department if you experience any worsening of your condition.   Thank you for allowing [place] to be a part of your care.

## 2023-11-11 ENCOUNTER — Encounter: Payer: Self-pay | Admitting: *Deleted

## 2023-11-13 ENCOUNTER — Other Ambulatory Visit: Payer: Self-pay | Admitting: Cardiology

## 2023-11-15 ENCOUNTER — Ambulatory Visit: Payer: Medicare Other | Attending: Cardiology | Admitting: Cardiology

## 2023-11-15 VITALS — BP 148/78 | HR 78 | Ht 66.0 in | Wt 152.0 lb

## 2023-11-15 DIAGNOSIS — E782 Mixed hyperlipidemia: Secondary | ICD-10-CM | POA: Diagnosis not present

## 2023-11-15 DIAGNOSIS — I714 Abdominal aortic aneurysm, without rupture, unspecified: Secondary | ICD-10-CM | POA: Diagnosis not present

## 2023-11-15 DIAGNOSIS — I4891 Unspecified atrial fibrillation: Secondary | ICD-10-CM

## 2023-11-15 DIAGNOSIS — I2510 Atherosclerotic heart disease of native coronary artery without angina pectoris: Secondary | ICD-10-CM

## 2023-11-15 DIAGNOSIS — Z950 Presence of cardiac pacemaker: Secondary | ICD-10-CM | POA: Diagnosis not present

## 2023-11-15 DIAGNOSIS — I495 Sick sinus syndrome: Secondary | ICD-10-CM

## 2023-11-15 DIAGNOSIS — D6869 Other thrombophilia: Secondary | ICD-10-CM

## 2023-11-15 DIAGNOSIS — I10 Essential (primary) hypertension: Secondary | ICD-10-CM

## 2023-11-15 NOTE — Patient Instructions (Addendum)
 Medication Instructions:   Continue all current medications.   Labwork:  none  Testing/Procedures:  none  Follow-Up:  6 months   Any Other Special Instructions Will Be Listed Below (If Applicable).  Update the office at the end of the week with your BP readings.    If you need a refill on your cardiac medications before your next appointment, please call your pharmacy.

## 2023-11-15 NOTE — Progress Notes (Signed)
 Clinical Summary Mr. Silvers is a 83 y.o.male seen today for follow up of the following medical problems     1. AAA   - followed by vascular   - 2021 AAA ectatic, stable 3 x 3 cm.  02/2023 AAA [place]: 3cm and table - per 02/2023 vascular note stable mild AAA, f/u 2-3 years       2. HTN   - He has had some white coat HTN in the past, typically elevated in clinic with normal home numbers - compliant with meds   3. Parox Afib   -on sotalol 160 mg bid, on approx since 2010.  - Has not been interested in DOACs, has elected to stay on coumadin.    - - no recent palpitations - no bleeding on coumadin.      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 CAD   -remote history per old clinic notes, prior angioplasty in 1996 and 1997 at [facility]     - no recent pains.    6. Hyperlipidemia   - he is on atorvastatin 80mg  daily, not clear when this was started. Tolearating without issues.  - reports to reaction rosuvastatin in the past - 06/2023 TC 154 TG 50 HDL 65 LDL 78   7. 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    2 grand daugthers just graduated from high school. 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 2008   USES C-PAP     Allergies  Allergen Reactions   Fexofenadine Hcl Palpitations    Allegra   Rosuvastatin      Current Outpatient  Medications  Medication Sig Dispense Refill   Ascorbic Acid (VITAMIN C) 1000 MG tablet 500 mg.      atorvastatin (LIPITOR) 80 MG tablet TAKE ONE TABLET BY MOUTH ONCE DAILY. 90 tablet 1   Cholecalciferol (VITAMIN D3) 50 MCG (2000 UT) CAPS Take 2,000 Units by mouth daily.     diltiazem (CARDIZEM CD) 240 MG 24 hr capsule Take 240 mg by mouth at bedtime.      levothyroxine (SYNTHROID) 100 MCG tablet Take 100 mcg by mouth daily.     losartan (COZAAR) 50 MG tablet TAKE (1 & 1/2) TABLETS BY MOUTH DAILY. 135 tablet 3   saw palmetto 500 MG capsule Take 500 mg by mouth 2 (two) times daily.      sotalol (BETAPACE) 160 MG tablet TAKE (1) TABLET BY MOUTH TWICE DAILY. 180 tablet 3   warfarin (COUMADIN) 5 MG tablet TAKE (1) TABLET BY MOUTH DAILY AS DIRECTED. (Patient taking differently: Take 5 mg by mouth daily. TAKE (1) TABLET BY MOUTH DAILY AS DIRECTED.) 100 tablet 1   zinc gluconate 50 MG  tablet 50 mg daily.     No current facility-administered medications for this visit.     Past Surgical History:  Procedure Laterality Date   CARDIAC CATHETERIZATION  1996   CARDIAC CATHETERIZATION  06/18/1996   mid-circ tandem overlying stents with 20%,70%mid-LAD naroowin stent site in circ with 70% stenosis in the mid to distal marginal Shandie Bertz,tandem 95% and 80% stenosis in the proximal portion of the posterior descending Morghan Kester of RCA    CATARACT EXTRACTION  1983   COLONOSCOPY N/A 09/04/2014   Procedure: COLONOSCOPY;  Surgeon: Malissa Hippo, MD;  Location: [facility];  Service: Endoscopy;  Laterality: N/A;  1030   CORONARY ANGIOPLASTY WITH STENT PLACEMENT  08/18/1995   PTA/STENT CIRC   CORONARY ANGIOPLASTY WITH STENT PLACEMENT  06/18/1996   PTCA  posterior descending  Paulmichael Schreck of the RCA 95 AND 80% TO LESSTHAN 20%   CORONARY STENT PLACEMENT  1996   DOPPLER ECHOCARDIOGRAPHY  04/17/2010   EF =50-55%; mild concentric LVH,LAE, AORTIC SCLEROSIS.TRACE TO MILD CENTRAL AI, TRACE MITRAL REGURGITATION, CANNOT ASSESS  DIASTOLIC FUNCTIONDUE TO UNDERLYING RHYTHM,PACERMAKER WIRE IN THE RA/RV   DOPPLER ECHOCARDIOGRAPHY  04/21/2007   DONE IN NEW BERN-- MILD MITRAL REGURG., MILD TRICUSPID REGURG. WITH MILD PULM. HTN, MILD CONCENTRIC LEFT VENTRICULAR HYPERTROPHY,MILD LEFT ATRIAL ENLARGEMENT.   EYE SURGERY  1983   Cataract   HERNIA REPAIR Bilateral 2000   INGUINAL HERNIA REPAIR Right 12/13/2018   Procedure: HERNIA REPAIR INGUINAL ADULT WITH MESH;  Surgeon: Franky Macho, MD;  Location: [facility] ORS;  Service: General;  Laterality: Right;   INSERT / REPLACE / REMOVE PACEMAKER  SEPT 2008   INSERT DUAL -CHAMBER-medtronic adapta [id number] serial   [id number]   NM MYOCAR MULTIPLE W/SPECT  04/17/2010   EF 50%; LOW NORMAL LV FUNCTION,   PACEMAKER INSERTION  2008   DUAL-CHAMBER   PPM GENERATOR CHANGEOUT N/A 12/29/2016   Procedure: PPM Generator Changeout;  Surgeon: Marinus Maw, MD;  [facility] INV[facility] CV LAB;  Service: Cardiovascular;  Laterality: N/A;     Allergies  Allergen Reactions   Fexofenadine Hcl Palpitations    Allegra   Rosuvastatin       Family History  Problem Relation Age of Onset   Peripheral vascular disease Mother    Hypertension Mother    Diabetes Mother    Heart disease Mother    Hyperlipidemia Mother    Stroke Father 31       TIA   Peripheral vascular disease Sister    Cancer Sister    Diabetes Sister    Heart disease Sister        Stent- Heart Disease before age 59   Hyperlipidemia Sister    Hypertension Sister    Peripheral vascular disease Brother    Diabetes Brother    Heart disease Brother        CHF and Heart Disease before age 2   Hyperlipidemia Brother    Hypertension Brother    Sleep apnea Neg Hx      Social History Mr. Skalla reports that he has never smoked. He has never used smokeless tobacco. Mr. Lewellen reports no history of alcohol use.    Physical Examination Today's Vitals   11/15/23 1059 11/15/23 1147  BP: (!) 148/72 (!) 148/78  Pulse: 78   SpO2: 98%    Weight: 152 lb (68.9 kg)   Height: 5\' 6"  (1.676 m)    Body mass index is 24.53 kg/m.  Gen: resting comfortably, no acute distress HEENT: no scleral  icterus, pupils equal round and reactive, no palptable cervical adenopathy,  CV: RRR, no mrg, no jvd Resp: Clear to auscultation bilaterally GI: abdomen is soft, non-tender, non-distended, normal bowel sounds, no hepatosplenomegaly MSK: extremities are warm, no edema.  Skin: warm, no rash Neuro:  no focal deficits Psych: appropriate affect   Diagnostic Studies  Echo 03/2010 [facility]: LVEF 50-55%, mild LVH, LAE     03/2010 [facility]: no ischemia, LVEF 50%   Assessment and Plan   1. AAA,   -mild AAA stable by recent imaging - due for f/u with vascular 2-3 years per there last note   2. CAD   -no symptoms, continue current meds   3. Parox afib /acquired thrombophilia - he is not in favor of DOACs, remain on coumadin - no symptoms, continue current meds - EKG today shows a paced v sensed, QTc 460 on sotalol   4. Tachy-brady syndrome/Pacemaker -last device check with normal function, no symptoms - continue to monitor   5. Hyperlipidemia -prior issues tolerating crestor, he is tolerating atorvastatin - LDL  reasonable, could add zetia in the future if further uptrend in LDL   6. HTN - usually runs high in clinic like it is today with home numbers well controlled - he will update [place] on home bp;'s end of the week  F/u  6months     Antoine Poche, M.D.

## 2023-11-16 ENCOUNTER — Ambulatory Visit: Attending: Cardiology | Admitting: *Deleted

## 2023-11-16 DIAGNOSIS — Z5181 Encounter for therapeutic drug level monitoring: Secondary | ICD-10-CM | POA: Diagnosis not present

## 2023-11-16 DIAGNOSIS — I4891 Unspecified atrial fibrillation: Secondary | ICD-10-CM | POA: Diagnosis not present

## 2023-11-16 LAB — POCT INR: INR: 3.1 — AB (ref 2.0–3.0)

## 2023-11-16 NOTE — Patient Instructions (Signed)
 Take warfarin 1/2 tablet tonight then resume 1 tablet daily Continue greens Recheck in 2 weeks.

## 2023-11-16 NOTE — Progress Notes (Signed)
 Remote pacemaker transmission.

## 2023-11-16 NOTE — Addendum Note (Signed)
 Addended by: Elease Etienne A on: 11/16/2023 02:13 PM   Modules accepted: Orders

## 2023-11-28 ENCOUNTER — Ambulatory Visit: Admitting: Urology

## 2023-11-28 ENCOUNTER — Encounter: Payer: Self-pay | Admitting: Cardiology

## 2023-11-29 ENCOUNTER — Ambulatory Visit: Payer: Medicare Other | Attending: Cardiology | Admitting: *Deleted

## 2023-11-29 DIAGNOSIS — I4891 Unspecified atrial fibrillation: Secondary | ICD-10-CM | POA: Diagnosis not present

## 2023-11-29 DIAGNOSIS — Z5181 Encounter for therapeutic drug level monitoring: Secondary | ICD-10-CM

## 2023-11-29 LAB — POCT INR: INR: 3.4 — AB (ref 2.0–3.0)

## 2023-11-29 NOTE — Patient Instructions (Signed)
 Hold warfarin tonight then decrease dose to 1 tablet daily except 1/2 tablet on Sundays Continue greens Recheck in 2 weeks.

## 2023-11-29 NOTE — Progress Notes (Unsigned)
 Name: Johnny Reynolds DOB: 10/23/1940 MRN: [id number]  History of Present Illness: Johnny Reynolds is a 83 y.o. male who presents today for follow up visit at [facility] [place]. He is accompanied by his wife Johnny Reynolds. GU History includes: 1. BPH with BOO & LUTS (nocturia x2). - Takes saw palmetto. - Denies history of elevated PSA.  At initial visit on 11/02/2023: - Seen for episode of acute urinary retention - Passed voiding trial. Taught how to self-cath PRN.   Since last visit: > 11/09/2023: Called in reporting need to reuse catheter due to retention; had PVR at home of >2000 ml at one point. Was seen for Urology nurse visit and elected to have indwelling Foley catheter placed instead of continuing CIC.  Today: He reports the catheter is draining well.  He denies gross hematuria.  He denies flank pain or abdominal pain. He denies fevers, nausea, or vomiting.   Medications: Current Outpatient Medications  Medication Sig Dispense Refill   Ascorbic Acid (VITAMIN C) 1000 MG tablet 500 mg.      atorvastatin (LIPITOR) 80 MG tablet TAKE ONE TABLET BY MOUTH ONCE DAILY. 90 tablet 1   Cholecalciferol (VITAMIN D3) 50 MCG (2000 UT) CAPS Take 2,000 Units by mouth daily.     diltiazem (CARDIZEM CD) 240 MG 24 hr capsule Take 240 mg by mouth at bedtime.      levothyroxine (SYNTHROID) 100 MCG tablet Take 100 mcg by mouth daily.     losartan (COZAAR) 50 MG tablet TAKE (1 & 1/2) TABLETS BY MOUTH DAILY. 135 tablet 3   saw palmetto 500 MG capsule Take 500 mg by mouth 2 (two) times daily.      sotalol (BETAPACE) 160 MG tablet TAKE (1) TABLET BY MOUTH TWICE DAILY. 180 tablet 3   tamsulosin (FLOMAX) 0.4 MG CAPS capsule Take 1 capsule (0.4 mg total) by mouth daily. 30 capsule 1   warfarin (COUMADIN) 5 MG tablet TAKE (1) TABLET BY MOUTH DAILY AS DIRECTED. (Patient taking differently: Take 5 mg by mouth daily. TAKE (1) TABLET BY MOUTH DAILY AS DIRECTED.) 100 tablet 1   zinc gluconate 50 MG tablet 50  mg daily.     No current facility-administered medications for this visit.    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 2008   USES C-PAP   Past Surgical History:  Procedure Laterality Date   CARDIAC CATHETERIZATION  1996   CARDIAC CATHETERIZATION  06/18/1996   mid-circ tandem overlying stents with 20%,70%mid-LAD naroowin stent site in circ with 70% stenosis in the mid to distal marginal branch,tandem 95% and 80% stenosis in the proximal portion of the posterior descending branch of RCA    CATARACT EXTRACTION  1983   COLONOSCOPY N/A 09/04/2014   Procedure: COLONOSCOPY;  Surgeon: Malissa Hippo, MD;  [facility]: [facility];  Service: Endoscopy;  Laterality: N/A;  1030   CORONARY ANGIOPLASTY WITH STENT PLACEMENT  08/18/1995   PTA/STENT CIRC   CORONARY ANGIOPLASTY WITH  STENT PLACEMENT  06/18/1996   PTCA  posterior descending  branch of the RCA 95 AND 80% TO LESSTHAN 20%   CORONARY STENT PLACEMENT  1996   DOPPLER ECHOCARDIOGRAPHY  04/17/2010   EF =50-55%; mild concentric LVH,LAE, AORTIC SCLEROSIS.TRACE TO MILD CENTRAL AI, TRACE MITRAL REGURGITATION, CANNOT ASSESS DIASTOLIC FUNCTIONDUE TO UNDERLYING RHYTHM,PACERMAKER WIRE IN THE RA/RV   DOPPLER ECHOCARDIOGRAPHY  04/21/2007   DONE IN NEW BERN-- MILD MITRAL REGURG., MILD TRICUSPID REGURG. WITH MILD PULM. HTN, MILD CONCENTRIC LEFT VENTRICULAR HYPERTROPHY,MILD LEFT ATRIAL ENLARGEMENT.   EYE SURGERY  1983   Cataract   HERNIA REPAIR Bilateral 2000   INGUINAL HERNIA REPAIR Right 12/13/2018   Procedure: HERNIA REPAIR INGUINAL  ADULT WITH MESH;  Surgeon: Franky Macho, MD;  Location: [facility];  Service: General;  Laterality: Right;   INSERT / REPLACE / REMOVE PACEMAKER  SEPT 2008   INSERT DUAL -CHAMBER-medtronic adapta [id number] serial   [id number]   NM MYOCAR MULTIPLE W/SPECT  04/17/2010   EF 50%; LOW NORMAL LV FUNCTION,   PACEMAKER INSERTION  2008   DUAL-CHAMBER   PPM GENERATOR CHANGEOUT N/A 12/29/2016   Procedure: PPM Generator Changeout;  Surgeon: Marinus Maw, MD;  [facility];  Service: Cardiovascular;  Laterality: N/A;   Family History  Problem Relation Age of Onset   Peripheral vascular disease Mother    Hypertension Mother    Diabetes Mother    Heart disease Mother    Hyperlipidemia Mother    Stroke Father 69       TIA   Peripheral vascular disease Sister    Cancer Sister    Diabetes Sister    Heart disease Sister        Stent- Heart Disease before age 41   Hyperlipidemia Sister    Hypertension Sister    Peripheral vascular disease Brother    Diabetes Brother    Heart disease Brother        CHF and Heart Disease before age 25   Hyperlipidemia Brother    Hypertension Brother    Sleep apnea Neg Hx    Social History   Socioeconomic History   Marital status: Married    Spouse name: Not on file   Number of children: Not on file   Years of education: Not on file   Highest education level: Not on file  Occupational History   Not on file  Tobacco Use   Smoking status: Never   Smokeless tobacco: Never  Vaping Use   Vaping status: Never Used  Substance and Sexual Activity   Alcohol use: No    Alcohol/week: 0.0 standard drinks of alcohol   Drug use: No   Sexual activity: Not on file  Other Topics Concern   Not on file  Social History Narrative   Controls his diabetes through diet and exercise.   Social Drivers of Corporate investment banker Strain: Not on file  Food Insecurity: Not on file  Transportation Needs: Not on file  Physical Activity: Not on file  Stress:  Not on file  Social Connections: Not on file  Intimate Partner Violence: Not on file    Review of Systems Constitutional: Patient denies any unintentional weight loss or change in strength lntegumentary: Patient denies any rashes or pruritus Cardiovascular: Patient denies chest pain or syncope Respiratory: Patient denies shortness of breath Gastrointestinal: Patient denies constipation Musculoskeletal: Patient denies muscle cramps or weakness Neurologic: Patient denies convulsions or seizures Allergic/Immunologic: Patient denies recent allergic reaction(s) Hematologic/Lymphatic: Patient  denies bleeding tendencies Endocrine: Patient denies heat/cold intolerance  GU: As per HPI.  OBJECTIVE Vitals:   11/30/23 0912  BP: (!) 149/88  Pulse: 70   There is no height or weight on file to calculate BMI.  Physical Examination Constitutional: No obvious distress; patient is non-toxic appearing  Cardiovascular: No visible lower extremity edema.  Respiratory: The patient does not have audible wheezing/stridor; respirations do not appear labored  Gastrointestinal: Abdomen non-distended Musculoskeletal: Normal ROM of UEs  Skin: No obvious rashes/open sores  Neurologic: CN 2-12 grossly intact Psychiatric: Answered questions appropriately with normal affect  Hematologic/Lymphatic/Immunologic: No obvious bruises or sites of spontaneous bleeding  ASSESSMENT Urinary retention - Plan: cephALEXin (KEFLEX) capsule 500 mg, INSERT,TEMP INDWELLING BLAD CATH,SIMPLE, tamsulosin (FLOMAX) 0.4 MG CAPS capsule  Benign localized hyperplasia of prostate with urinary obstruction - Plan: cephALEXin (KEFLEX) capsule 500 mg, INSERT,TEMP INDWELLING BLAD CATH,SIMPLE, tamsulosin (FLOMAX) 0.4 MG CAPS capsule  Foley catheter in place - Plan: cephALEXin (KEFLEX) capsule 500 mg, INSERT,TEMP INDWELLING BLAD CATH,SIMPLE, tamsulosin (FLOMAX) 0.4 MG CAPS capsule  After careful review of all available data and history and  physical examination, it is felt that this patient has symptomatic BPH/BOO with urinary retention.   We agreed to start Flomax (Tamsulosin) 0.4 mg daily for BPH/BOO. Discussed mechanism of action and potential side effects. Indwelling Foley catheter was exchanged today; will continue with that for now and follow up for nurse visit in 1 week with voiding trial.   Patient verbalized understanding of and agreement with current plan. All questions were answered.  PLAN Advised the following: 1. Start Flomax (Tamsulosin) 0.4 mg daily.  2. Indwelling Foley catheter. 3. Return for nurse visit for voiding trial.  Orders Placed This Encounter  Procedures   INSERT,TEMP INDWELLING BLAD CATH,SIMPLE    It has been explained that the patient is to follow regularly with their PCP in addition to all other providers involved in their care and to follow instructions provided by these respective offices. Patient advised to contact urology clinic if any urologic-pertaining questions, concerns, new symptoms or problems arise in the interim period.  There are no Patient Instructions on file for this visit.  Electronically signed by:  Donnita Falls, FNP   11/30/23    9:54 AM

## 2023-11-30 ENCOUNTER — Encounter: Payer: Self-pay | Admitting: Urology

## 2023-11-30 ENCOUNTER — Ambulatory Visit: Admitting: Urology

## 2023-11-30 VITALS — BP 149/88 | HR 70

## 2023-11-30 DIAGNOSIS — N138 Other obstructive and reflux uropathy: Secondary | ICD-10-CM

## 2023-11-30 DIAGNOSIS — Z978 Presence of other specified devices: Secondary | ICD-10-CM

## 2023-11-30 DIAGNOSIS — R339 Retention of urine, unspecified: Secondary | ICD-10-CM | POA: Diagnosis not present

## 2023-11-30 DIAGNOSIS — N401 Enlarged prostate with lower urinary tract symptoms: Secondary | ICD-10-CM | POA: Diagnosis not present

## 2023-11-30 MED ORDER — TAMSULOSIN HCL 0.4 MG PO CAPS: 0.4000 mg | ORAL_CAPSULE | Freq: Every day | ORAL | 1 refills | Status: DC

## 2023-11-30 MED ORDER — CEPHALEXIN 250 MG PO CAPS
500.0000 mg | ORAL_CAPSULE | Freq: Once | ORAL | Status: AC
Start: 2023-11-30 — End: 2023-11-30
  Administered 2023-11-30: 500 mg via ORAL

## 2023-11-30 NOTE — Progress Notes (Signed)
 Cath Change/ Replacement  Patient is present today for a catheter change due to urinary retention.  10ml of water was removed from the balloon, a 16FR foley cath was removed without difficulty.  Patient was cleaned and prepped in a sterile fashion with Betadinex3.  A 16 FR foley cath was replaced into the bladder, no complications were noted. Urine return was noted 10ml and urine was Dark yellow in color. The balloon was filled with 10ml of sterile water. A leg bag was attached for drainage.  A night bag was also given to the patient and patient was given instruction on how to change from one bag to another. Patient was given proper instruction on catheter care.    Performed by: Kennyth Lose, CMA  Follow up: 1 week nurse visited voiding trial

## 2023-12-07 ENCOUNTER — Ambulatory Visit

## 2023-12-09 ENCOUNTER — Ambulatory Visit (INDEPENDENT_AMBULATORY_CARE_PROVIDER_SITE_OTHER)

## 2023-12-09 DIAGNOSIS — R339 Retention of urine, unspecified: Secondary | ICD-10-CM | POA: Diagnosis not present

## 2023-12-09 NOTE — Progress Notes (Signed)
 Fill and Pull Catheter Removal  Patient is present today for a catheter removal.  of sterile water  was instilled into the bladder when the patient felt the urge to urinate. 10ml of water  was then drained from the balloon.  A 16FR foley cath was removed from the bladder no complications were noted .  Foley catheter intact and time of removal. Patient as then given some time to void on their own.  Patient can void  on their own after some time.  Patient tolerated well.  One oral prophylactic antibiotic given per MD orders  Performed by: Alan RN  Follow up/ Additional notes: patient will follow up with Dr. Sherrilee for 1 week PVR

## 2023-12-12 ENCOUNTER — Ambulatory Visit

## 2023-12-13 ENCOUNTER — Ambulatory Visit: Attending: Cardiology | Admitting: *Deleted

## 2023-12-13 DIAGNOSIS — Z5181 Encounter for therapeutic drug level monitoring: Secondary | ICD-10-CM | POA: Diagnosis not present

## 2023-12-13 DIAGNOSIS — I4891 Unspecified atrial fibrillation: Secondary | ICD-10-CM

## 2023-12-13 LAB — POCT INR: INR: 2.5 (ref 2.0–3.0)

## 2023-12-13 NOTE — Patient Instructions (Signed)
 Continue warfarin 1 tablet daily except 1/2 tablet on Sundays Continue greens Recheck in 4 weeks.

## 2023-12-16 ENCOUNTER — Ambulatory Visit (INDEPENDENT_AMBULATORY_CARE_PROVIDER_SITE_OTHER)

## 2023-12-16 DIAGNOSIS — N138 Other obstructive and reflux uropathy: Secondary | ICD-10-CM

## 2023-12-16 DIAGNOSIS — N401 Enlarged prostate with lower urinary tract symptoms: Secondary | ICD-10-CM

## 2023-12-16 DIAGNOSIS — R339 Retention of urine, unspecified: Secondary | ICD-10-CM | POA: Diagnosis not present

## 2023-12-16 LAB — BLADDER SCAN AMB NON-IMAGING: Scan Result: 0

## 2023-12-16 NOTE — Progress Notes (Signed)
 Bladder Scan completed today.  Patient can void prior to the bladder scan. Bladder scan result: 0  Performed By: Kennyth Lose, CMA  Additional notes-

## 2023-12-20 ENCOUNTER — Other Ambulatory Visit: Payer: Self-pay

## 2023-12-20 ENCOUNTER — Telehealth: Payer: Self-pay | Admitting: Urology

## 2023-12-20 DIAGNOSIS — Z978 Presence of other specified devices: Secondary | ICD-10-CM

## 2023-12-20 DIAGNOSIS — R339 Retention of urine, unspecified: Secondary | ICD-10-CM

## 2023-12-20 DIAGNOSIS — N138 Other obstructive and reflux uropathy: Secondary | ICD-10-CM

## 2023-12-20 MED ORDER — TAMSULOSIN HCL 0.4 MG PO CAPS: 0.4000 mg | ORAL_CAPSULE | Freq: Every day | ORAL | 1 refills | Status: DC

## 2023-12-20 NOTE — Telephone Encounter (Signed)
 Would like Tamsulosin  sent in for 90 days supply instead of 30 days.

## 2023-12-20 NOTE — Telephone Encounter (Signed)
 Tried calling patient with no answer. Patient is made aware via voicced 90 day supply of flomax  has been sent to pharmacy on file.

## 2024-01-10 ENCOUNTER — Ambulatory Visit: Attending: Cardiology | Admitting: *Deleted

## 2024-01-10 DIAGNOSIS — I4891 Unspecified atrial fibrillation: Secondary | ICD-10-CM | POA: Diagnosis not present

## 2024-01-10 DIAGNOSIS — Z5181 Encounter for therapeutic drug level monitoring: Secondary | ICD-10-CM | POA: Diagnosis not present

## 2024-01-10 LAB — POCT INR: INR: 2.1 (ref 2.0–3.0)

## 2024-01-10 NOTE — Patient Instructions (Signed)
 Continue warfarin 1 tablet daily except 1/2 tablet on Sundays Continue greens Recheck in 6 weeks.

## 2024-01-11 ENCOUNTER — Ambulatory Visit (INDEPENDENT_AMBULATORY_CARE_PROVIDER_SITE_OTHER): Payer: Medicare Other

## 2024-01-11 DIAGNOSIS — I4891 Unspecified atrial fibrillation: Secondary | ICD-10-CM | POA: Diagnosis not present

## 2024-01-11 LAB — CUP PACEART REMOTE DEVICE CHECK
Battery Remaining Longevity: 61 mo
Battery Voltage: 2.96 V
Brady Statistic AP VP Percent: 0 %
Brady Statistic AP VS Percent: 98.18 %
Brady Statistic AS VP Percent: 0 %
Brady Statistic AS VS Percent: 1.82 %
Brady Statistic RA Percent Paced: 98.31 %
Brady Statistic RV Percent Paced: 0 %
Date Time Interrogation Session: 20250521014450
Implantable Lead Connection Status: 753985
Implantable Lead Connection Status: 753985
Implantable Lead Implant Date: 20080902
Implantable Lead Implant Date: 20080902
Implantable Lead Location: 753859
Implantable Lead Location: 753860
Implantable Lead Model: 5076
Implantable Lead Model: 5076
Implantable Pulse Generator Implant Date: 20180509
Lead Channel Impedance Value: 323 Ohm
Lead Channel Impedance Value: 342 Ohm
Lead Channel Impedance Value: 418 Ohm
Lead Channel Impedance Value: 456 Ohm
Lead Channel Pacing Threshold Amplitude: 0.75 V
Lead Channel Pacing Threshold Amplitude: 1 V
Lead Channel Pacing Threshold Pulse Width: 0.4 ms
Lead Channel Pacing Threshold Pulse Width: 0.4 ms
Lead Channel Sensing Intrinsic Amplitude: 0.75 mV
Lead Channel Sensing Intrinsic Amplitude: 0.75 mV
Lead Channel Sensing Intrinsic Amplitude: 18.625 mV
Lead Channel Sensing Intrinsic Amplitude: 18.625 mV
Lead Channel Setting Pacing Amplitude: 2 V
Lead Channel Setting Pacing Amplitude: 2.5 V
Lead Channel Setting Pacing Pulse Width: 0.4 ms
Lead Channel Setting Sensing Sensitivity: 0.9 mV
Zone Setting Status: 755011
Zone Setting Status: 755011

## 2024-01-12 ENCOUNTER — Ambulatory Visit

## 2024-01-15 ENCOUNTER — Ambulatory Visit: Payer: Self-pay | Admitting: Internal Medicine

## 2024-02-14 ENCOUNTER — Encounter: Admitting: Urology

## 2024-02-14 ENCOUNTER — Encounter: Payer: Self-pay | Admitting: Urology

## 2024-02-14 VITALS — BP 157/67 | HR 78 | Temp 97.9°F

## 2024-02-14 DIAGNOSIS — N401 Enlarged prostate with lower urinary tract symptoms: Secondary | ICD-10-CM

## 2024-02-14 DIAGNOSIS — N138 Other obstructive and reflux uropathy: Secondary | ICD-10-CM

## 2024-02-14 NOTE — Progress Notes (Deleted)
 Name: Johnny Reynolds DOB: [birth year] MRN: [id number]  History of Present Illness: Johnny Reynolds is a 83 y.o. male who presents today for follow up visit at [facility] [place].  Relevant History includes: 1. BPH with BOO & LUTS (nocturia x2). - Takes saw palmetto. - Denies history of elevated PSA. 2. Prior episode of acute urinary retention in March 2025. - Was taught how to self-cath PRN.     At last visit on 11/30/2023: - Started Flomax  every day. Foley catheter exchanged.  - Plan was nurse visit in 1 week with voiding trial.   Since last visit: > 12/09/2023: Urology nurse visit. Passed voiding trial. Foley catheter discontinued.  > 12/16/2023: Urology nurse visit. PVR = 0 ml.  Today: He reports ***  He {Actions; denies-reports:120008} urinary urgency, frequency, nocturia x***, dysuria, gross hematuria, ***weak urinary stream, hesitancy, straining to void, or sensations of incomplete emptying.   Medications: Current Outpatient Medications  Medication Sig Dispense Refill   Ascorbic Acid (VITAMIN C) 1000 MG tablet 500 mg.      atorvastatin  (LIPITOR) 80 MG tablet TAKE ONE TABLET BY MOUTH ONCE DAILY. 90 tablet 1   Cholecalciferol (VITAMIN D3) 50 MCG (2000 UT) CAPS Take 2,000 Units by mouth daily.     diltiazem  (CARDIZEM  CD) 240 MG 24 hr capsule Take 240 mg by mouth at bedtime.      levothyroxine  (SYNTHROID ) 100 MCG tablet Take 100 mcg by mouth daily.     losartan  (COZAAR ) 50 MG tablet TAKE (1 & 1/2) TABLETS BY MOUTH DAILY. 135 tablet 3   saw palmetto 500 MG capsule Take 500 mg by mouth 2 (two) times daily.      sotalol  (BETAPACE ) 160 MG tablet TAKE (1) TABLET BY MOUTH TWICE DAILY. 180 tablet 3   tamsulosin  (FLOMAX ) 0.4 MG CAPS capsule Take 1 capsule (0.4 mg total) by mouth daily. 90 capsule 1   warfarin (COUMADIN ) 5 MG tablet TAKE (1) TABLET BY MOUTH DAILY AS DIRECTED. (Patient taking differently: Take 5 mg by mouth daily. TAKE (1) TABLET BY MOUTH DAILY AS DIRECTED.) 100  tablet 1   zinc gluconate 50 MG tablet 50 mg daily.     No current facility-administered medications for this visit.    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 2008   USES C-PAP   Past Surgical History:  Procedure Laterality Date   CARDIAC CATHETERIZATION  1996   CARDIAC CATHETERIZATION  06/18/1996   mid-circ tandem overlying stents with 20%,70%mid-LAD naroowin stent site in circ with 70% stenosis in the mid to distal marginal branch,tandem 95% and 80% stenosis in the proximal portion of the posterior descending branch of RCA    CATARACT EXTRACTION  1983   COLONOSCOPY N/A 09/04/2014   Procedure: COLONOSCOPY;  Surgeon: Claudis RAYMOND Rivet, MD;  [facility];  Service: Endoscopy;  Laterality: N/A;  1030   CORONARY ANGIOPLASTY WITH STENT PLACEMENT  08/18/1995   PTA/STENT CIRC   CORONARY ANGIOPLASTY WITH STENT  PLACEMENT  06/18/1996   PTCA  posterior descending  branch of the RCA 95 AND 80% TO LESSTHAN 20%   CORONARY STENT PLACEMENT  1996   DOPPLER ECHOCARDIOGRAPHY  04/17/2010   EF =50-55%; mild concentric LVH,LAE, AORTIC SCLEROSIS.TRACE TO MILD CENTRAL AI, TRACE MITRAL REGURGITATION, CANNOT ASSESS DIASTOLIC FUNCTIONDUE TO UNDERLYING RHYTHM,PACERMAKER WIRE IN THE RA/RV   DOPPLER ECHOCARDIOGRAPHY  04/21/2007   DONE IN NEW BERN-- MILD MITRAL REGURG., MILD TRICUSPID REGURG. WITH MILD PULM. HTN, MILD CONCENTRIC LEFT VENTRICULAR HYPERTROPHY,MILD LEFT ATRIAL ENLARGEMENT.   EYE SURGERY  1983   Cataract   HERNIA REPAIR Bilateral 2000   INGUINAL HERNIA REPAIR Right  12/13/2018   Procedure: HERNIA REPAIR INGUINAL ADULT WITH MESH;  Surgeon: Mavis Anes, MD;  Location: [facility];  Service: General;  Laterality: Right;   INSERT / REPLACE / REMOVE PACEMAKER  SEPT 2008   INSERT DUAL -CHAMBER-medtronic adapta [id number] serial   [id number]   NM MYOCAR MULTIPLE W/SPECT  04/17/2010   EF 50%; LOW NORMAL LV FUNCTION,   PACEMAKER INSERTION  2008   DUAL-CHAMBER   PPM GENERATOR CHANGEOUT N/A 12/29/2016   Procedure: PPM Generator Changeout;  Surgeon: Waddell Danelle ORN, MD;  [facility];  Service: Cardiovascular;  Laterality: N/A;   Family History  Problem Relation Age of Onset   Peripheral vascular disease Mother    Hypertension Mother    Diabetes Mother    Heart disease Mother    Hyperlipidemia Mother    Stroke Father 52       TIA   Peripheral vascular disease Sister    Cancer Sister    Diabetes Sister    Heart disease Sister        Stent- Heart Disease before age 65   Hyperlipidemia Sister    Hypertension Sister    Peripheral vascular disease Brother    Diabetes Brother    Heart disease Brother        CHF and Heart Disease before age 64   Hyperlipidemia Brother    Hypertension Brother    Sleep apnea Neg Hx    Social History   Socioeconomic History   Marital status: Married    Spouse name: Not on file   Number of children: Not on file   Years of education: Not on file   Highest education level: Not on file  Occupational History   Not on file  Tobacco Use   Smoking status: Never   Smokeless tobacco: Never  Vaping Use   Vaping status: Never Used  Substance and Sexual Activity   Alcohol use: No    Alcohol/week: 0.0 standard drinks of alcohol   Drug use: No   Sexual activity: Not on file  Other Topics Concern   Not on file  Social History Narrative   Controls his diabetes through diet and exercise.   Social Drivers of Corporate investment banker Strain: Not on file  Food Insecurity: Not on file  Transportation Needs: Not on  file  Physical Activity: Not on file  Stress: Not on file  Social Connections: Not on file  Intimate Partner Violence: Not on file    Review of Systems Constitutional: Patient denies any unintentional weight loss or change in strength lntegumentary: Patient denies any rashes or pruritus Cardiovascular: Patient denies chest pain or syncope Respiratory: Patient denies shortness of breath Gastrointestinal: ***Patient denies nausea, vomiting, constipation, or diarrhea ***As per HPI Musculoskeletal: Patient denies muscle cramps or weakness Neurologic: Patient denies convulsions or seizures Allergic/Immunologic: Patient  denies recent allergic reaction(s) Hematologic/Lymphatic: Patient denies bleeding tendencies Endocrine: Patient denies heat/cold intolerance  GU: As per HPI.  OBJECTIVE Vitals:   02/14/24 1604  BP: (!) 157/67  Pulse: 78  Temp: 97.9 F (36.6 C)   There is no height or weight on file to calculate BMI.  Physical Examination Constitutional: No obvious distress; patient is non-toxic appearing  Cardiovascular: No visible lower extremity edema.  Respiratory: The patient does not have audible wheezing/stridor; respirations do not appear labored  Gastrointestinal: Abdomen non-distended Musculoskeletal: Normal ROM of UEs  Skin: No obvious rashes/open sores  Neurologic: CN 2-12 grossly intact Psychiatric: Answered questions appropriately with normal affect  Hematologic/Lymphatic/Immunologic: No obvious bruises or sites of spontaneous bleeding  UA: ***negative ***positive for *** leukocytes, *** blood, ***nitrites Urine microscopy: *** WBC/hpf, *** RBC/hpf, *** bacteria ***glucosuria (secondary to ***Jardiance ***Farxiga use) ***otherwise unremarkable  PVR: *** ml  ASSESSMENT Benign localized hyperplasia of prostate with urinary obstruction ***  We agreed to plan for follow up in *** months / ***1 year or sooner if needed. Patient verbalized understanding of and  agreement with current plan. All questions were answered.  PLAN Advised the following: 1. *** 2. ***No follow-ups on file.  No orders of the defined types were placed in this encounter.   It has been explained that the patient is to follow regularly with their PCP in addition to all other providers involved in their care and to follow instructions provided by these respective offices. Patient advised to contact urology clinic if any urologic-pertaining questions, concerns, new symptoms or problems arise in the interim period.  There are no Patient Instructions on file for this visit.  Electronically signed by:  Lauraine JAYSON Oz, FNP   02/14/24    4:09 PM

## 2024-02-16 NOTE — Progress Notes (Signed)
 This encounter was created in error - please disregard.  This encounter was created in error - please disregard.

## 2024-02-21 ENCOUNTER — Ambulatory Visit: Attending: Cardiology | Admitting: *Deleted

## 2024-02-21 DIAGNOSIS — I4891 Unspecified atrial fibrillation: Secondary | ICD-10-CM | POA: Diagnosis not present

## 2024-02-21 DIAGNOSIS — Z5181 Encounter for therapeutic drug level monitoring: Secondary | ICD-10-CM

## 2024-02-21 LAB — POCT INR: INR: 2 (ref 2.0–3.0)

## 2024-02-21 NOTE — Progress Notes (Deleted)
coag

## 2024-02-21 NOTE — Progress Notes (Signed)
Please see anticoagulation encounter.

## 2024-02-21 NOTE — Patient Instructions (Signed)
 Increase warfarin to 1 tablet daily Continue greens Recheck in 6 weeks.

## 2024-03-01 NOTE — Progress Notes (Signed)
 Remote pacemaker transmission.

## 2024-04-03 ENCOUNTER — Ambulatory Visit: Attending: Cardiology | Admitting: *Deleted

## 2024-04-03 DIAGNOSIS — I4891 Unspecified atrial fibrillation: Secondary | ICD-10-CM | POA: Diagnosis not present

## 2024-04-03 DIAGNOSIS — Z5181 Encounter for therapeutic drug level monitoring: Secondary | ICD-10-CM

## 2024-04-03 LAB — POCT INR: INR: 2 (ref 2.0–3.0)

## 2024-04-03 NOTE — Progress Notes (Signed)
 INR 2.0 Please see anticoagulation encounter.

## 2024-04-03 NOTE — Patient Instructions (Signed)
 Continue warfarin 1 tablet daily  Continue greens Recheck in 6 weeks

## 2024-04-11 ENCOUNTER — Ambulatory Visit (INDEPENDENT_AMBULATORY_CARE_PROVIDER_SITE_OTHER): Payer: Medicare Other

## 2024-04-11 DIAGNOSIS — I4891 Unspecified atrial fibrillation: Secondary | ICD-10-CM | POA: Diagnosis not present

## 2024-04-12 LAB — CUP PACEART REMOTE DEVICE CHECK
Battery Remaining Longevity: 56 mo
Battery Voltage: 2.96 V
Brady Statistic AP VP Percent: 0.03 %
Brady Statistic AP VS Percent: 99.82 %
Brady Statistic AS VP Percent: 0 %
Brady Statistic AS VS Percent: 0.15 %
Brady Statistic RA Percent Paced: 99.85 %
Brady Statistic RV Percent Paced: 0.03 %
Date Time Interrogation Session: 20250820014629
Implantable Lead Connection Status: 753985
Implantable Lead Connection Status: 753985
Implantable Lead Implant Date: 20080902
Implantable Lead Implant Date: 20080902
Implantable Lead Location: 753859
Implantable Lead Location: 753860
Implantable Lead Model: 5076
Implantable Lead Model: 5076
Implantable Pulse Generator Implant Date: 20180509
Lead Channel Impedance Value: 323 Ohm
Lead Channel Impedance Value: 361 Ohm
Lead Channel Impedance Value: 437 Ohm
Lead Channel Impedance Value: 456 Ohm
Lead Channel Pacing Threshold Amplitude: 0.75 V
Lead Channel Pacing Threshold Amplitude: 1 V
Lead Channel Pacing Threshold Pulse Width: 0.4 ms
Lead Channel Pacing Threshold Pulse Width: 0.4 ms
Lead Channel Sensing Intrinsic Amplitude: 1 mV
Lead Channel Sensing Intrinsic Amplitude: 1 mV
Lead Channel Sensing Intrinsic Amplitude: 21.5 mV
Lead Channel Sensing Intrinsic Amplitude: 21.5 mV
Lead Channel Setting Pacing Amplitude: 2 V
Lead Channel Setting Pacing Amplitude: 2.5 V
Lead Channel Setting Pacing Pulse Width: 0.4 ms
Lead Channel Setting Sensing Sensitivity: 0.9 mV
Zone Setting Status: 755011
Zone Setting Status: 755011

## 2024-04-15 ENCOUNTER — Ambulatory Visit: Payer: Self-pay | Admitting: Internal Medicine

## 2024-05-15 ENCOUNTER — Ambulatory Visit: Attending: Cardiology | Admitting: *Deleted

## 2024-05-15 DIAGNOSIS — Z5181 Encounter for therapeutic drug level monitoring: Secondary | ICD-10-CM

## 2024-05-15 DIAGNOSIS — I4891 Unspecified atrial fibrillation: Secondary | ICD-10-CM | POA: Diagnosis not present

## 2024-05-15 LAB — POCT INR: INR: 2.3 (ref 2.0–3.0)

## 2024-05-15 NOTE — Patient Instructions (Signed)
 Continue warfarin 1 tablet daily  Continue greens Recheck in 6 weeks

## 2024-05-15 NOTE — Progress Notes (Signed)
 INR-2.3; Please see anticoagulation encounter

## 2024-05-16 ENCOUNTER — Other Ambulatory Visit: Payer: Self-pay | Admitting: Cardiology

## 2024-05-16 ENCOUNTER — Telehealth: Payer: Self-pay

## 2024-05-16 NOTE — Telephone Encounter (Signed)
 [employer] stating due to circumstances beyond their control, they are changing manufacturers for an NTI medication warfarin. Laws regarding NTI medications require that they make provider aware of this change. If the provider has any questions or concerns regarding this information, please do not hesitate to contact the pharmacy at [phone].

## 2024-05-16 NOTE — Progress Notes (Signed)
 Remote PPM Transmission

## 2024-05-21 ENCOUNTER — Ambulatory Visit: Attending: Cardiology | Admitting: Cardiology

## 2024-05-21 ENCOUNTER — Encounter: Payer: Self-pay | Admitting: Cardiology

## 2024-05-21 ENCOUNTER — Other Ambulatory Visit: Payer: Self-pay | Admitting: Cardiology

## 2024-05-21 VITALS — BP 160/80 | HR 83 | Ht 66.0 in | Wt 170.4 lb

## 2024-05-21 DIAGNOSIS — I714 Abdominal aortic aneurysm, without rupture, unspecified: Secondary | ICD-10-CM

## 2024-05-21 DIAGNOSIS — I495 Sick sinus syndrome: Secondary | ICD-10-CM | POA: Diagnosis not present

## 2024-05-21 DIAGNOSIS — I10 Essential (primary) hypertension: Secondary | ICD-10-CM

## 2024-05-21 DIAGNOSIS — I2510 Atherosclerotic heart disease of native coronary artery without angina pectoris: Secondary | ICD-10-CM | POA: Diagnosis not present

## 2024-05-21 DIAGNOSIS — E782 Mixed hyperlipidemia: Secondary | ICD-10-CM

## 2024-05-21 MED ORDER — APIXABAN 5 MG PO TABS: 5.0000 mg | ORAL_TABLET | Freq: Two times a day (BID) | ORAL | 5 refills | Status: DC

## 2024-05-21 NOTE — Telephone Encounter (Signed)
 At office visit with Dr Alvan today pt decided to stop warfarin and start Eliquis 5mg  twice daily.  He will hold warfarin tonight and start Eliquis 5mg  twice daily tomorrow.  Eliquis teaching done with patient and he verbalized understanding.  Rx sent to [facility] per pt request.

## 2024-05-21 NOTE — Telephone Encounter (Signed)
 Noted

## 2024-05-21 NOTE — Progress Notes (Signed)
 Clinical Summary Mr. Levitz is a 83 y.o.male seen today for follow up of the following medical problems     1. AAA   - followed by vascular   - 2021 AAA ectatic, stable 3 x 3 cm.  02/2023 AAA US : 3cm and table - per 02/2023 vascular note stable mild AAA, f/u 2-3 years       2. HTN   - He has had some white coat HTN in the past, typically elevated in clinic with normal home numbers - compliant with meds  Home bp's 05/16/24: 3pm BP 124/69/72  05/19/24: 3pm BP 133/76/70  05/21/24:  11am BP 145/78/70   3. Parox Afib   -on sotalol  160 mg bid, on approx since 2010.  - Has not been interested in DOACs, has elected to stay on coumadin .    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    5. CAD   -remote history per old clinic notes, prior angioplasty in 1996 and 1997 at [facility]     - no recent chest pains.    6. Hyperlipidemia   - he is on atorvastatin  80mg  daily, not clear when this was started. Tolearating without issues.  - reports reaction to rosuvastatin  in the past - 06/2023 TC 154 TG 50 HDL 65 LDL 78   7. 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    2 grand daugthers just graduated from high school. 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 2008    USES C-PAP     Allergies  Allergen Reactions   Fexofenadine Hcl Palpitations    Allegra   Rosuvastatin       Current Outpatient Medications  Medication Sig Dispense Refill   Ascorbic Acid (VITAMIN C) 1000 MG tablet 500 mg.      atorvastatin  (LIPITOR) 80 MG tablet TAKE ONE TABLET BY MOUTH ONCE DAILY. 90 tablet 1   Cholecalciferol (VITAMIN D3) 50 MCG (2000 UT) CAPS Take 2,000 Units by mouth daily.     diltiazem  (CARDIZEM  CD) 240 MG 24 hr capsule Take 240 mg by mouth at bedtime.      levothyroxine  (SYNTHROID ) 100 MCG tablet Take 100 mcg by mouth daily.     losartan  (COZAAR ) 50 MG tablet TAKE (1 & 1/2) TABLETS BY MOUTH DAILY. 135 tablet 3   saw palmetto 500 MG capsule Take 500 mg by mouth 2 (two) times daily.      sotalol  (BETAPACE ) 160 MG tablet TAKE (1) TABLET BY MOUTH TWICE DAILY. 180 tablet 3   tamsulosin  (FLOMAX ) 0.4 MG CAPS capsule Take 1 capsule (0.4 mg total) by mouth daily. 90  capsule 1   warfarin (COUMADIN ) 5 MG tablet TAKE (1) TABLET BY MOUTH DAILY AS DIRECTED. 100 tablet 2   zinc gluconate 50 MG tablet 50 mg daily.     No current facility-administered medications for this visit.     Past Surgical History:  Procedure Laterality Date   CARDIAC CATHETERIZATION  1996   CARDIAC CATHETERIZATION  06/18/1996   mid-circ tandem overlying stents with 20%,70%mid-LAD naroowin stent site in circ with 70% stenosis in the mid to distal marginal Azari Hasler,tandem 95% and 80% stenosis in the proximal portion of the posterior descending Airen Dales of RCA    CATARACT EXTRACTION  1983   COLONOSCOPY N/A 09/04/2014   Procedure: COLONOSCOPY;  Surgeon: Claudis RAYMOND Rivet, MD;  [facility];  Service: Endoscopy;  Laterality: N/A;  1030   CORONARY ANGIOPLASTY WITH STENT PLACEMENT  08/18/1995   PTA/STENT CIRC   CORONARY ANGIOPLASTY WITH STENT PLACEMENT  06/18/1996   PTCA  posterior descending  Janiah Devinney of the RCA 95 AND 80% TO LESSTHAN 20%   CORONARY STENT PLACEMENT  1996   DOPPLER  ECHOCARDIOGRAPHY  04/17/2010   EF =50-55%; mild concentric LVH,LAE, AORTIC SCLEROSIS.TRACE TO MILD CENTRAL AI, TRACE MITRAL REGURGITATION, CANNOT ASSESS DIASTOLIC FUNCTIONDUE TO UNDERLYING RHYTHM,PACERMAKER WIRE IN THE RA/RV   DOPPLER ECHOCARDIOGRAPHY  04/21/2007   DONE IN NEW BERN-- MILD MITRAL REGURG., MILD TRICUSPID REGURG. WITH MILD PULM. HTN, MILD CONCENTRIC LEFT VENTRICULAR HYPERTROPHY,MILD LEFT ATRIAL ENLARGEMENT.   EYE SURGERY  1983   Cataract   HERNIA REPAIR Bilateral 2000   INGUINAL HERNIA REPAIR Right 12/13/2018   Procedure: HERNIA REPAIR INGUINAL ADULT WITH MESH;  Surgeon: Mavis Anes, MD;  Location: [facility];  Service: General;  Laterality: Right;   INSERT / REPLACE / REMOVE PACEMAKER  SEPT 2008   INSERT DUAL -CHAMBER-medtronic adapta [id number] serial   [id number]   NM MYOCAR MULTIPLE W/SPECT  04/17/2010   EF 50%; LOW NORMAL LV FUNCTION,   PACEMAKER INSERTION  2008   DUAL-CHAMBER   PPM GENERATOR CHANGEOUT N/A 12/29/2016   Procedure: PPM Generator Changeout;  Surgeon: Waddell Danelle ORN, MD;  [facility];  Service: Cardiovascular;  Laterality: N/A;     Allergies  Allergen Reactions   Fexofenadine Hcl Palpitations    Allegra   Rosuvastatin        Family History  Problem Relation Age of Onset   Peripheral vascular disease Mother    Hypertension Mother    Diabetes Mother    Heart disease Mother    Hyperlipidemia Mother    Stroke Father 52       TIA   Peripheral vascular disease Sister    Cancer Sister    Diabetes Sister    Heart disease Sister        Stent- Heart Disease before age 69   Hyperlipidemia Sister    Hypertension Sister    Peripheral vascular disease Brother    Diabetes Brother    Heart disease Brother        CHF and Heart Disease before age 23   Hyperlipidemia Brother    Hypertension Brother    Sleep apnea Neg Hx      Social History Mr. Barley reports that he has never smoked. He has never used smokeless tobacco. Mr. Clinard reports no  history of alcohol use.    Physical Examination Today's Vitals   05/21/24 1302 05/21/24 1338  BP: (!) 170/84 (!) 160/80  Pulse: 83   SpO2: 97%   Weight: 170 lb 6.4 oz (  77.3 kg)   Height: 5' 6 (1.676 m)    Body mass index is 27.5 kg/m.  Gen: resting comfortably, no acute distress HEENT: no scleral icterus, pupils equal round and reactive, no palptable cervical adenopathy,  CV: RRR, no m/r,g no jvd Resp: Clear to auscultation bilaterally GI: abdomen is soft, non-tender, non-distended, normal bowel sounds, no hepatosplenomegaly MSK: extremities are warm, no edema.  Skin: warm, no rash Neuro:  no focal deficits Psych: appropriate affect   Diagnostic Studies     Assessment and Plan   1. AAA,   -mild AAA stable by recent imaging - repeat imaging late 2026   2. CAD   -denies symptoms, continue current meds   3. Parox afib /acquired thrombophilia - had not been in favor of DOACs, remains on coumadin . Starting to be open to idea of starting eliquis, he will let us  know. Will reach out to coumadin  clinic, he wants to cut way back on green leafy vegetables, likely will need earlier INR check  - denies symptoms   4. Tachy-brady syndrome/Pacemaker -recent normal device check, no symptoms, continue to monitor   5. Hyperlipidemia -prior issues tolerating crestor , he is tolerating atorvastatin  - LDL  reasonable, follow for now. May add zetia pending trend   6. HTN - usually runs high in clinic like it is today with home numbers well controlled - continue current meds   F/u 6 months     Dorn PHEBE Ross, M.D.

## 2024-05-21 NOTE — Patient Instructions (Signed)
 Medication Instructions:  Continue all current medications.   Labwork: none  Testing/Procedures: none  Follow-Up: 6 months   Any Other Special Instructions Will Be Listed Below (If Applicable).   If you need a refill on your cardiac medications before your next appointment, please call your pharmacy.

## 2024-05-22 ENCOUNTER — Ambulatory Visit: Payer: Self-pay | Admitting: *Deleted

## 2024-06-05 ENCOUNTER — Ambulatory Visit: Attending: Cardiology | Admitting: *Deleted

## 2024-06-05 DIAGNOSIS — Z5181 Encounter for therapeutic drug level monitoring: Secondary | ICD-10-CM | POA: Diagnosis not present

## 2024-06-05 DIAGNOSIS — I4891 Unspecified atrial fibrillation: Secondary | ICD-10-CM

## 2024-06-05 LAB — POCT INR: INR: 1.6 — AB (ref 2.0–3.0)

## 2024-06-05 NOTE — Progress Notes (Signed)
 INR 1.6; Please see anticoagulation encounter

## 2024-06-05 NOTE — Patient Instructions (Signed)
 Pt has been taking 3-4mg  (6) days a week and 2.5mg  on Sundays Decrease warfarin to 1 tablet (5mg ) daily except 1/2 tablet (2.5mg ) on Sundays and Thursdays Continue greens Recheck in 2 weeks.

## 2024-06-08 ENCOUNTER — Other Ambulatory Visit: Payer: Self-pay

## 2024-06-08 DIAGNOSIS — R339 Retention of urine, unspecified: Secondary | ICD-10-CM

## 2024-06-08 DIAGNOSIS — N401 Enlarged prostate with lower urinary tract symptoms: Secondary | ICD-10-CM

## 2024-06-08 DIAGNOSIS — Z978 Presence of other specified devices: Secondary | ICD-10-CM

## 2024-06-11 MED ORDER — TAMSULOSIN HCL 0.4 MG PO CAPS: 0.4000 mg | ORAL_CAPSULE | Freq: Every day | ORAL | 1 refills | Status: DC

## 2024-06-19 ENCOUNTER — Ambulatory Visit: Attending: Cardiology | Admitting: *Deleted

## 2024-06-19 DIAGNOSIS — I4891 Unspecified atrial fibrillation: Secondary | ICD-10-CM | POA: Diagnosis not present

## 2024-06-19 DIAGNOSIS — Z5181 Encounter for therapeutic drug level monitoring: Secondary | ICD-10-CM

## 2024-06-19 LAB — POCT INR: INR: 2.2 (ref 2.0–3.0)

## 2024-06-19 NOTE — Patient Instructions (Signed)
 Continue warfarin 1 tablet (5mg ) daily except 1/2 tablet (2.5mg ) on Sundays and Thursdays Continue greens Recheck in 3 weeks.

## 2024-06-19 NOTE — Progress Notes (Signed)
 INR 2.2; Please see anticoagulation encounter

## 2024-06-26 ENCOUNTER — Encounter

## 2024-07-10 ENCOUNTER — Ambulatory Visit: Attending: Cardiology | Admitting: *Deleted

## 2024-07-10 DIAGNOSIS — I4891 Unspecified atrial fibrillation: Secondary | ICD-10-CM

## 2024-07-10 DIAGNOSIS — Z5181 Encounter for therapeutic drug level monitoring: Secondary | ICD-10-CM | POA: Diagnosis not present

## 2024-07-10 LAB — POCT INR: INR: 2.5 (ref 2.0–3.0)

## 2024-07-10 NOTE — Progress Notes (Signed)
 INR 2.5. Please see anticoagulation encounter

## 2024-07-10 NOTE — Patient Instructions (Signed)
 Continue warfarin 1 tablet (5mg ) daily except 1/2 tablet (2.5mg ) on Sundays and Thursdays Continue greens Recheck in 4 weeks.

## 2024-07-11 ENCOUNTER — Ambulatory Visit: Payer: Medicare Other

## 2024-07-11 DIAGNOSIS — I4891 Unspecified atrial fibrillation: Secondary | ICD-10-CM

## 2024-07-12 ENCOUNTER — Ambulatory Visit: Payer: Self-pay | Admitting: Internal Medicine

## 2024-07-12 LAB — CUP PACEART REMOTE DEVICE CHECK
Battery Remaining Longevity: 54 mo
Battery Voltage: 2.95 V
Brady Statistic AP VP Percent: 0.06 %
Brady Statistic AP VS Percent: 99.88 %
Brady Statistic AS VP Percent: 0 %
Brady Statistic AS VS Percent: 0.06 %
Brady Statistic RA Percent Paced: 99.94 %
Brady Statistic RV Percent Paced: 0.06 %
Date Time Interrogation Session: 20251119004432
Implantable Lead Connection Status: 753985
Implantable Lead Connection Status: 753985
Implantable Lead Implant Date: 20080902
Implantable Lead Implant Date: 20080902
Implantable Lead Location: 753859
Implantable Lead Location: 753860
Implantable Lead Model: 5076
Implantable Lead Model: 5076
Implantable Pulse Generator Implant Date: 20180509
Lead Channel Impedance Value: 342 Ohm
Lead Channel Impedance Value: 361 Ohm
Lead Channel Impedance Value: 437 Ohm
Lead Channel Impedance Value: 475 Ohm
Lead Channel Pacing Threshold Amplitude: 0.75 V
Lead Channel Pacing Threshold Amplitude: 1 V
Lead Channel Pacing Threshold Pulse Width: 0.4 ms
Lead Channel Pacing Threshold Pulse Width: 0.4 ms
Lead Channel Sensing Intrinsic Amplitude: 0.875 mV
Lead Channel Sensing Intrinsic Amplitude: 0.875 mV
Lead Channel Sensing Intrinsic Amplitude: 21.125 mV
Lead Channel Sensing Intrinsic Amplitude: 21.125 mV
Lead Channel Setting Pacing Amplitude: 2 V
Lead Channel Setting Pacing Amplitude: 2.5 V
Lead Channel Setting Pacing Pulse Width: 0.4 ms
Lead Channel Setting Sensing Sensitivity: 0.9 mV
Zone Setting Status: 755011
Zone Setting Status: 755011

## 2024-07-13 NOTE — Progress Notes (Signed)
 Remote PPM Transmission

## 2024-08-07 ENCOUNTER — Ambulatory Visit: Admitting: *Deleted

## 2024-08-07 DIAGNOSIS — I4891 Unspecified atrial fibrillation: Secondary | ICD-10-CM

## 2024-08-07 DIAGNOSIS — Z5181 Encounter for therapeutic drug level monitoring: Secondary | ICD-10-CM

## 2024-08-07 LAB — POCT INR: INR: 2.4 (ref 2.0–3.0)

## 2024-08-07 NOTE — Patient Instructions (Signed)
 Continue warfarin 1 tablet (5mg ) daily except 1/2 tablet (2.5mg ) on Sundays and Thursdays Continue greens Recheck in 6 weeks.

## 2024-08-07 NOTE — Progress Notes (Signed)
 INR 2.4 Please see anticoagulation encounter

## 2024-08-28 ENCOUNTER — Ambulatory Visit: Admitting: Urology

## 2024-09-10 ENCOUNTER — Ambulatory Visit: Admitting: Urology

## 2024-09-10 ENCOUNTER — Encounter: Payer: Self-pay | Admitting: Urology

## 2024-09-10 VITALS — BP 154/76 | HR 93

## 2024-09-10 DIAGNOSIS — N401 Enlarged prostate with lower urinary tract symptoms: Secondary | ICD-10-CM

## 2024-09-10 DIAGNOSIS — R3912 Poor urinary stream: Secondary | ICD-10-CM

## 2024-09-10 DIAGNOSIS — Z978 Presence of other specified devices: Secondary | ICD-10-CM

## 2024-09-10 DIAGNOSIS — R339 Retention of urine, unspecified: Secondary | ICD-10-CM

## 2024-09-10 LAB — MICROSCOPIC EXAMINATION

## 2024-09-10 LAB — URINALYSIS, ROUTINE W REFLEX MICROSCOPIC
Bilirubin, UA: NEGATIVE
Glucose, UA: NEGATIVE
Ketones, UA: NEGATIVE
Nitrite, UA: NEGATIVE
Protein,UA: NEGATIVE
RBC, UA: NEGATIVE
Specific Gravity, UA: 1.02 (ref 1.005–1.030)
Urobilinogen, Ur: 2 mg/dL — ABNORMAL HIGH (ref 0.2–1.0)
pH, UA: 6 (ref 5.0–7.5)

## 2024-09-10 MED ORDER — FINASTERIDE 5 MG PO TABS: 5.0000 mg | ORAL_TABLET | Freq: Every day | ORAL | 3 refills | Status: AC

## 2024-09-10 MED ORDER — TAMSULOSIN HCL 0.4 MG PO CAPS: 0.4000 mg | ORAL_CAPSULE | Freq: Every day | ORAL | 3 refills | Status: AC

## 2024-09-10 NOTE — Progress Notes (Unsigned)
 Patient blood pressure value first attempt is high. A repeat blood pressure check completed.  Does patient have a PCP? Yes Has the patient taken then blood pressure medicine today? Yes  Discussed with patient the importance of reviewing their blood pressure with PCP and adhering to their blood medication is the patient is prescribed any. Patient voiced understanding.

## 2024-09-10 NOTE — Progress Notes (Unsigned)
 "  09/10/2024 10:56 AM   Johnny Reynolds 05-13-1941 [id number]  Referring provider: Marvine Rush, MD [address] [place],  [place] [address]  No chief complaint on file.   HPI:  New patient for me-  1) BPH-patient presented in retention March 2025 when CT revealed bilateral hydro, distended bladder and a 65 g prostate.  Started on tamsulosin .  He passed a voiding trial April 2025.  IPSS 6.  09/10/2024: Today, seen for the above. No gross hematuria. On tamsulosin . IPSS 6. Drinks a lot of water .   UA benign. No rbc.   He is on Coumadin . 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   *****************  A/P:  BPH with lower urinary tract symptoms-we discussed the nature risk benefits and alternatives to adding finasteride .  He could have significant benefit at his age and on Coumadin  with decreased risk of needing a future catheter, prostate surgery or bleeding.  Discussed FDA warnings.  All questions answered.He will start.   F/u 1 year or sooner if issues.     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 2008   USES C-PAP    Surgical History: Past Surgical History:  Procedure Laterality Date   CARDIAC CATHETERIZATION  1996   CARDIAC CATHETERIZATION  06/18/1996   mid-circ tandem overlying stents with 20%,70%mid-LAD naroowin stent site in circ with 70% stenosis in the mid to distal marginal branch,tandem 95% and 80% stenosis in the proximal portion of the posterior descending branch of RCA    CATARACT EXTRACTION  1983   COLONOSCOPY N/A 09/04/2014   Procedure: COLONOSCOPY;  Surgeon: Claudis RAYMOND Rivet, MD;  [facility];  Service: Endoscopy;   Laterality: N/A;  1030   CORONARY ANGIOPLASTY WITH STENT PLACEMENT  08/18/1995   PTA/STENT CIRC   CORONARY ANGIOPLASTY WITH STENT PLACEMENT  06/18/1996   PTCA  posterior descending  branch of the RCA 95 AND 80% TO LESSTHAN 20%   CORONARY STENT PLACEMENT  1996   DOPPLER ECHOCARDIOGRAPHY  04/17/2010   EF =50-55%; mild concentric LVH,LAE, AORTIC SCLEROSIS.TRACE TO MILD CENTRAL AI, TRACE MITRAL REGURGITATION, CANNOT ASSESS DIASTOLIC FUNCTIONDUE TO UNDERLYING RHYTHM,PACERMAKER WIRE IN THE RA/RV   DOPPLER ECHOCARDIOGRAPHY  04/21/2007   DONE IN NEW BERN-- MILD MITRAL REGURG., MILD TRICUSPID REGURG. WITH MILD PULM. HTN, MILD CONCENTRIC LEFT VENTRICULAR HYPERTROPHY,MILD LEFT ATRIAL ENLARGEMENT.   EYE SURGERY  1983   Cataract   HERNIA REPAIR Bilateral 2000   INGUINAL HERNIA REPAIR Right 12/13/2018   Procedure: HERNIA REPAIR INGUINAL ADULT WITH MESH;  Surgeon: Mavis Anes, MD;  Location: [facility];  Service: General;  Laterality: Right;   INSERT / REPLACE / REMOVE PACEMAKER  SEPT 2008   INSERT DUAL -CHAMBER-medtronic adapta [id number] serial   [id number]   NM MYOCAR MULTIPLE W/SPECT  04/17/2010   EF 50%; LOW NORMAL LV FUNCTION,   PACEMAKER INSERTION  2008   DUAL-CHAMBER   PPM GENERATOR CHANGEOUT N/A 12/29/2016   Procedure: PPM Generator Changeout;  Surgeon: Waddell Danelle ORN, MD;  [facility];  Service: Cardiovascular;  Laterality: N/A;  Home Medications:  Allergies as of 09/10/2024       Reactions   Fexofenadine Hcl Palpitations   Allegra   Rosuvastatin          Medication List        Accurate as of September 10, 2024 10:56 AM. If you have any questions, ask your nurse or doctor.          apixaban  5 MG Tabs tablet Commonly known as: Eliquis  Take 1 tablet (5 mg total) by mouth 2 (two) times daily.   atorvastatin  80 MG tablet Commonly known as: LIPITOR TAKE ONE TABLET BY MOUTH ONCE DAILY.   diltiazem  240 MG 24 hr capsule Commonly known as: CARDIZEM  CD Take 240 mg by  mouth at bedtime.   levothyroxine  100 MCG tablet Commonly known as: SYNTHROID  Take 100 mcg by mouth daily.   losartan  50 MG tablet Commonly known as: COZAAR  TAKE (1 & 1/2) TABLETS BY MOUTH DAILY.   saw palmetto 500 MG capsule Take 500 mg by mouth 2 (two) times daily.   sotalol  160 MG tablet Commonly known as: BETAPACE  TAKE (1) TABLET BY MOUTH TWICE DAILY.   tamsulosin  0.4 MG Caps capsule Commonly known as: FLOMAX  Take 1 capsule (0.4 mg total) by mouth daily.   vitamin C 1000 MG tablet 500 mg.   vitamin D3 50 MCG (2000 UT) Caps Take 2,000 Units by mouth daily.   zinc gluconate 50 MG tablet 50 mg daily.        Allergies: Allergies[1]  Family History: Family History  Problem Relation Age of Onset   Peripheral vascular disease Mother    Hypertension Mother    Diabetes Mother    Heart disease Mother    Hyperlipidemia Mother    Stroke Father 60       TIA   Peripheral vascular disease Sister    Cancer Sister    Diabetes Sister    Heart disease Sister        Stent- Heart Disease before age 30   Hyperlipidemia Sister    Hypertension Sister    Peripheral vascular disease Brother    Diabetes Brother    Heart disease Brother        CHF and Heart Disease before age 42   Hyperlipidemia Brother    Hypertension Brother    Sleep apnea Neg Hx     Social History:  reports that he has never smoked. He has never used smokeless tobacco. He reports that he does not drink alcohol and does not use drugs.   Physical Exam: BP (!) 152/72   Pulse 77   Constitutional:  Alert and oriented, No acute distress. HEENT: [place] AT, moist mucus membranes.  Trachea midline, no masses. Cardiovascular: No clubbing, cyanosis, or edema. Respiratory: Normal respiratory effort, no increased work of breathing. GI: Abdomen is soft, nontender, nondistended, no abdominal masses GU: No CVA tenderness Skin: No rashes, bruises or suspicious lesions. Neurologic: Grossly intact, no focal deficits,  moving all 4 extremities. Psychiatric: Normal mood and affect.  Laboratory Data: Lab Results  Component Value Date   WBC 11.2 (H) 10/31/2023   HGB 13.5 10/31/2023   HCT 39.8 10/31/2023   MCV 91.3 10/31/2023   PLT 160 10/31/2023    Lab Results  Component Value Date   CREATININE 0.72 10/31/2023    No results found for: PSA  No results found for: TESTOSTERONE  No results found for: HGBA1C  Urinalysis    Component Value Date/Time   COLORURINE STRAW (A) 11/09/2023 0430  APPEARANCEUR CLEAR 11/09/2023 0430   LABSPEC 1.008 11/09/2023 0430   PHURINE 7.0 11/09/2023 0430   GLUCOSEU 50 (A) 11/09/2023 0430   HGBUR SMALL (A) 11/09/2023 0430   BILIRUBINUR NEGATIVE 11/09/2023 0430   KETONESUR NEGATIVE 11/09/2023 0430   PROTEINUR NEGATIVE 11/09/2023 0430   UROBILINOGEN 0.2 12/05/2011 1011   NITRITE NEGATIVE 11/09/2023 0430   LEUKOCYTESUR NEGATIVE 11/09/2023 0430    Lab Results  Component Value Date   BACTERIA RARE (A) 11/09/2023    Pertinent Imaging: CT images reviewed.  Results for orders placed during the hospital encounter of 10/31/23  CT Renal Stone Study  Narrative CLINICAL DATA:  Bladder neck obstruction.  Diarrhea.  Pain.  EXAM: CT ABDOMEN AND PELVIS WITHOUT CONTRAST  TECHNIQUE: Multidetector CT imaging of the abdomen and pelvis was performed following the standard protocol without IV contrast.  RADIATION DOSE REDUCTION: This exam was performed according to the departmental dose-optimization program which includes automated exposure control, adjustment of the mA and/or kV according to patient size and/or use of iterative reconstruction technique.  COMPARISON:  CT abdomen and pelvis 12/13/2017. CT abdomen and pelvis 12/07/2010.  FINDINGS: Lower chest: There is atelectasis in the lung bases.  Hepatobiliary: Focal hypodense lesion in the posterior aspect of the right lobe of the liver measuring up to 18 mm appears unchanged previously  characterized as vascular shunt. Gallbladder and bile ducts are within normal limits.  Pancreas: Unremarkable. No pancreatic ductal dilatation or surrounding inflammatory changes.  Spleen: Normal in size without focal abnormality.  Adrenals/Urinary Tract: The bladder is markedly distended. No bladder calculi or surrounding inflammation. There is mild bilateral hydroureteronephrosis without obstructing calculus identified. There is a cyst in the right kidney measuring 2.8 cm. There some faint areas of hyperdensity adjacent to the superior margin of the cyst measuring up to 3 mm, new from prior. The adrenal glands are within normal limits.  Stomach/Bowel: Stomach is within normal limits. Appendix appears normal. No evidence of bowel wall thickening, distention, or inflammatory changes. There is diffuse colonic diverticulosis.  Vascular/Lymphatic: There is infrarenal abdominal aortic aneurysm measuring 3.2 cm similar to the prior study. There are atherosclerotic calcifications of the aorta. No enlarged lymph nodes are identified.  Reproductive: Prostate gland is enlarged.  Other: There has been interval repair of right inguinal hernia. Abdominal wall mesh is seen in the inguinal regions bilaterally. No ascites.  Musculoskeletal: Degenerative changes affect the spine.  IMPRESSION: 1. Markedly distended bladder with mild bilateral hydroureteronephrosis. No obstructing calculus identified. Findings may be related to bladder outlet obstruction. 2. Enlarged prostate gland. 3. Colonic diverticulosis without evidence for diverticulitis. 4. Stable infrarenal abdominal aortic aneurysm measuring 3.2 cm. Recommend follow-up ultrasound every 3 years. 5. Faint areas of hyperdensity adjacent to the superior margin of the right renal cyst measuring up to 3 mm, new from prior. Findings may represent early wall calcification. No follow-up imaging recommended. 6. Aortic  atherosclerosis.  Aortic Atherosclerosis (ICD10-I70.0).   Electronically Signed By: Greig Pique M.D. On: 10/31/2023 17:36   No follow-ups on file.  Donnice Brooks, MD  [facility]  [address] [place], [place] [address] [phone]      [1]  Allergies Allergen Reactions   Fexofenadine Hcl Palpitations    Allegra   Rosuvastatin     "

## 2024-09-14 ENCOUNTER — Other Ambulatory Visit: Payer: Self-pay | Admitting: Cardiology

## 2024-09-19 ENCOUNTER — Ambulatory Visit: Attending: Cardiology | Admitting: *Deleted

## 2024-09-19 ENCOUNTER — Encounter: Payer: Self-pay | Admitting: Cardiology

## 2024-09-19 DIAGNOSIS — Z5181 Encounter for therapeutic drug level monitoring: Secondary | ICD-10-CM | POA: Diagnosis not present

## 2024-09-19 DIAGNOSIS — I4891 Unspecified atrial fibrillation: Secondary | ICD-10-CM | POA: Diagnosis not present

## 2024-09-19 LAB — POCT INR: INR: 3.2 — AB (ref 2.0–3.0)

## 2024-09-19 NOTE — Patient Instructions (Signed)
 Hold warfarin tonight then resume 1 tablet (5mg ) daily except 1/2 tablet (2.5mg ) on Sundays and Thursdays Continue greens Recheck in 3 weeks.

## 2024-09-19 NOTE — Progress Notes (Signed)
INR 3.2

## 2024-09-21 ENCOUNTER — Other Ambulatory Visit: Payer: Self-pay | Admitting: Cardiology

## 2024-09-21 DIAGNOSIS — I480 Paroxysmal atrial fibrillation: Secondary | ICD-10-CM

## 2024-10-10 ENCOUNTER — Ambulatory Visit

## 2024-10-18 ENCOUNTER — Ambulatory Visit: Payer: Medicare Other | Admitting: Neurology

## 2024-11-05 ENCOUNTER — Ambulatory Visit: Admitting: Cardiology

## 2024-11-07 ENCOUNTER — Ambulatory Visit: Admitting: Cardiology

## 2025-01-09 ENCOUNTER — Ambulatory Visit

## 2025-04-10 ENCOUNTER — Ambulatory Visit

## 2025-07-10 ENCOUNTER — Ambulatory Visit

## 2025-09-09 ENCOUNTER — Ambulatory Visit: Admitting: Urology

## 2025-10-09 ENCOUNTER — Ambulatory Visit

## 2026-01-08 ENCOUNTER — Ambulatory Visit

## 2026-04-09 ENCOUNTER — Ambulatory Visit

## 2026-07-09 ENCOUNTER — Ambulatory Visit

## 2026-10-08 ENCOUNTER — Ambulatory Visit
